# Patient Record
Sex: Female | Born: 1935 | Race: White | Hispanic: No | Marital: Single | Smoking: Former smoker
Health system: Southern US, Community
[De-identification: ages and names within clinical notes are randomized; demographics above are authoritative.]

## PROBLEM LIST (undated history)

## (undated) DIAGNOSIS — R339 Retention of urine, unspecified: Secondary | ICD-10-CM

## (undated) DIAGNOSIS — R609 Edema, unspecified: Secondary | ICD-10-CM

## (undated) DIAGNOSIS — Z87448 Personal history of other diseases of urinary system: Secondary | ICD-10-CM

## (undated) DIAGNOSIS — H9190 Unspecified hearing loss, unspecified ear: Secondary | ICD-10-CM

## (undated) DIAGNOSIS — Z974 Presence of external hearing-aid: Secondary | ICD-10-CM

## (undated) DIAGNOSIS — N35919 Unspecified urethral stricture, male, unspecified site: Secondary | ICD-10-CM

## (undated) DIAGNOSIS — M1990 Unspecified osteoarthritis, unspecified site: Secondary | ICD-10-CM

## (undated) DIAGNOSIS — I10 Essential (primary) hypertension: Secondary | ICD-10-CM

---

## 1999-12-01 ENCOUNTER — Other Ambulatory Visit: Admission: RE | Admit: 1999-12-01 | Discharge: 1999-12-01 | Payer: Self-pay | Admitting: Internal Medicine

## 2007-01-31 ENCOUNTER — Ambulatory Visit: Payer: Self-pay | Admitting: Vascular Surgery

## 2010-10-16 ENCOUNTER — Ambulatory Visit (HOSPITAL_BASED_OUTPATIENT_CLINIC_OR_DEPARTMENT_OTHER)
Admission: RE | Admit: 2010-10-16 | Discharge: 2010-10-16 | Disposition: A | Discharge status: Home / Self Care | Payer: Medicare Other | Source: Ambulatory Visit | Attending: Urology | Admitting: Urology

## 2010-10-16 ENCOUNTER — Ambulatory Visit (HOSPITAL_COMMUNITY)
Admission: RE | Admit: 2010-10-16 | Discharge: 2010-10-16 | Disposition: A | Discharge status: Home / Self Care | Payer: Medicare Other | Source: Ambulatory Visit | Attending: Urology | Admitting: Urology

## 2010-10-16 DIAGNOSIS — N35919 Unspecified urethral stricture, male, unspecified site: Secondary | ICD-10-CM | POA: Insufficient documentation

## 2010-10-16 DIAGNOSIS — N323 Diverticulum of bladder: Secondary | ICD-10-CM | POA: Insufficient documentation

## 2010-10-16 DIAGNOSIS — Z01810 Encounter for preprocedural cardiovascular examination: Secondary | ICD-10-CM | POA: Insufficient documentation

## 2010-10-16 DIAGNOSIS — Z01811 Encounter for preprocedural respiratory examination: Secondary | ICD-10-CM | POA: Insufficient documentation

## 2010-10-16 DIAGNOSIS — R9431 Abnormal electrocardiogram [ECG] [EKG]: Secondary | ICD-10-CM | POA: Insufficient documentation

## 2010-10-16 HISTORY — PX: OTHER SURGICAL HISTORY: SHX169

## 2010-10-16 LAB — POCT I-STAT 4, (NA,K, GLUC, HGB,HCT)
HCT: 40 % (ref 36.0–46.0)
Hemoglobin: 13.6 g/dL (ref 12.0–15.0)

## 2010-10-24 NOTE — Op Note (Signed)
NAMEMarland Kitchen  BICH, MCHANEY           ACCOUNT NO.:  [id number]  MEDICAL RECORD NO.:  [id number]           PATIENT TYPE:  O  LOCATION:  XRAY                         FACILITY:  [facility]  PHYSICIAN:  Excell Seltzer. Annabell Howells, M.D.    DATE OF BIRTH:  07/20/1928  DATE OF PROCEDURE:  10/16/2010 DATE OF DISCHARGE:                              OPERATIVE REPORT   PROCEDURES:  Cystoscopy, dilation of urethral stricture, and fulguration of bleeder  PREOPERATIVE DIAGNOSIS:  Urethral stricture, possible bladder stone.  POSTOPERATIVE DIAGNOSIS:  Urethral stricture.  No stone found.  SURGEON:  Excell Seltzer. Annabell Howells, M.D.  ANESTHESIA:  General.  SPECIMEN:  None.  DRAINS:  A 20-French Foley catheter.  COMPLICATIONS:  None.  INDICATIONS:  Tara Bennett is an 77 year old white female with a history of urethral stricture who returned earlier this week for followup studies.  She was found to have a very slow flow of approximately 2 to 3 cc per second and residual urine of 276 cc consistent with recurrence of a stricture.  Also on the ultrasound, there was a suggestion of a 1.3-cm bladder stone.  When an attempt in the office to perform cystoscopy and dilation was unsuccessful, it was felt that repeating procedure under anesthesia would be the most effective way to manage her stricture.  FINDINGS AND PROCEDURE:  She was given Cipro.  She was taken to the operating room where general anesthetic was induced.  She was placed in lithotomy position.  Her perineum and genitalia were prepped with Betadine solution.  She was draped in usual sterile fashion.  Cystoscopy was performed using a 22-French scope and 12-degree lens.  Initial examination revealed a normal meatus.  The distal ureter was unremarkable but there was very tight stricture proximally that was difficult to visualize.  A 22-French female sound was then passed successfully into the bladder with return of urine as confirmation.  She was then dilated up  to 63- [place].  There was some bleeding with the dilation.  Cystoscopy was performed which revealed disrupted scar anteriorly with a more normal posterior urethra.  Examination of bladder revealed small diverticulum of right posterior wall with mild trabeculation.  No tumors or stones were identified.  Ureteral orifices were in the normal anatomic position effluxing clear urine.  At this point, the urethra was then dilated to 32-French.  Inspection after dilation revealed an active bleeder at 12 o'clock.  A Bugbee electrode was then used to fulgurate the bleeding site.  There was still some oozing after Bugbee and I did not want to overly cauterize the urethra for fear of worsening her stricture.  So with the minimal oozing, a 20-French Foley catheter was inserted.  The balloon was filled with 10 cc of sterile fluid and placed a straight drainage.  She was taken down from lithotomy position.  Her anesthetic was reversed.  She was moved to the recovery room in stable condition. The catheter will be removed in an hour.  She will be sent home with Vicodin and Pyridium and will follow up with me on October 23, 2010 at 10:45.     Excell Seltzer. Annabell Howells, M.D.  JJW/MEDQ  D:  10/16/2010  T:  10/16/2010  Job:  [id number]  Electronically Signed by Bjorn Pippin M.D. on 10/23/2010 12:58:40 PM

## 2011-01-01 NOTE — Op Note (Signed)
  NAMEMarland Bennett  [name], [name]           ACCOUNT NO.:  [id number]  MEDICAL RECORD NO.:  [id number]           PATIENT TYPE:  O  LOCATION:  XRAY                         FACILITY:  [facility]  PHYSICIAN:  Excell Seltzer. Annabell Howells, M.D.    DATE OF BIRTH:  06-Sep-1927  DATE OF PROCEDURE:  10/16/2010 DATE OF DISCHARGE:  10/16/2010                              OPERATIVE REPORT   Patient of Dr. Bjorn Pippin.  PROCEDURE:  Cystoscopy, urethral dilation, and fulguration.  PREOPERATIVE DIAGNOSIS:  Urethral stricture with possible bladder stone.  POSTOPERATIVE DIAGNOSIS:  Urethral stricture.  SURGEON:  Excell Seltzer. Annabell Howells, MD  ANESTHESIA:  General.  SPECIMEN:  None.  DRAIN:  Foley catheter.  COMPLICATIONS:  None.  INDICATIONS:  [name] is an 77 year old white female with history of urethral stricture disease who on office ultrasound was felt to have a possible stone in the bladder and it was felt that operative intervention with dilation of stone removal was indicated.  FINDINGS OF PROCEDURE:  She was given Cipro.  She was taken to the operating room where general anesthetic was induced.  She was placed in lithotomy position.  Her perineum and genitalia were prepped with Betadine solution and she was draped in usual sterile fashion.  Her urethra was calibrated with female sounds to 30-French and cystoscopy was then performed.  Examination revealed a normal bladder, normal ureteral orifices, and no obvious stones.  There was mild trabeculation.  There was some bleeding from the dilated urethra.  A Bugbee electrode was used to fulgurate arterial bleeder.  At this point, a Foley was inserted.  The patient was taken down from lithotomy position.  Her anesthetic was reversed and she was moved to recovery room in stable condition.     Excell Seltzer. Annabell Howells, M.D.     JJW/MEDQ  D:  12/04/2010  T:  12/05/2010  Job:  [id number]  Electronically Signed by Bjorn Pippin M.D. on 01/01/2011 10:49:48 AM

## 2011-01-08 NOTE — Op Note (Signed)
  NAMEMarland Kitchen  Tara, Bennett           ACCOUNT NO.:  [id number]  MEDICAL RECORD NO.:  [id number]           PATIENT TYPE:  O  LOCATION:  XRAY                         FACILITY:  [facility]  PHYSICIAN:  Excell Seltzer. Annabell Howells, M.D.    DATE OF BIRTH:  October 25, 1927  DATE OF PROCEDURE:  10/16/2010 DATE OF DISCHARGE:  10/16/2010                              OPERATIVE REPORT   PROCEDURE: 1. Cystoscopy. 2. Urethral dilation. 3. Fulguration of bleeder  PREOPERATIVE DIAGNOSIS:  Urethral stricture, possible bladder stone.  POSTOPERATIVE DIAGNOSES:  Urethral stricture.  SURGEON:  Excell Seltzer. Annabell Howells, M.D.  ANESTHESIA:  General.  COMPLICATIONS:  None.  INDICATIONS:  Ms. Bolds is an 77 year old white female with urethral stricture and on ultrasound a possible bladder stone.  It was felt that cystoscopy with urethral dilation and removal of bladder stone was indicated.  FINDINGS OF PROCEDURE:  She was taken to the operating room where general anesthetic was induced.  She was given antibiotic.  She was placed in lithotomy position.  Her perineum and genitalia were prepped with Betadine solution.  She was draped in usual sterile fashion.  Time- out was performed.  Her urethra was dilated with female sounds to 29- [place].  Endoscopy was then performed.  The bladder wall was smooth and pale without tumor, stones or inflammation.  Ureteral orifices were unremarkable.  There was no evidence of a bladder stone.  At this point there was noted to be some bleeding from the anterior urethra where the stricture had been dilated.  This was fulgurated with a Bugbee electrode.  The bladder was then drained.  The patient was taken down from lithotomy position.  Her anesthetic was reversed.  She was moved to recovery room in stable condition.  There were no complications.     Excell Seltzer. Annabell Howells, M.D.    JJW/MEDQ  D:  01/01/2011  T:  01/01/2011  Job:  [id number]  Electronically Signed by Bjorn Pippin M.D. on 01/08/2011  07:58:26 AM

## 2011-06-30 ENCOUNTER — Other Ambulatory Visit: Payer: Self-pay | Admitting: Urology

## 2011-07-01 ENCOUNTER — Encounter (HOSPITAL_BASED_OUTPATIENT_CLINIC_OR_DEPARTMENT_OTHER): Payer: Self-pay | Admitting: *Deleted

## 2011-07-01 NOTE — H&P (Signed)
Reason For Visit Urethral dilation.  History of Present Illness          Tara Bennett is an 77 y/o white female with h/o urethral stricture.  She presents today for acute evaluation of LUTS and is requesting repeat UDF.  Her last dilation was 10/2010 and had to be performed under anesthesia due to inability to tolerate in the office.  She reports dramatic improvement for several months following this procedure.  She has noticed a gradual weakening of her stream for the past 2 months but over the past 2-3 weeks she has had progressive weakening of her stream with strong urge to void and frequent, small volume voids.  She does not feel that she is emptying her bladder on voiding.  She denies gross hematuria, flank pain, fever or chills. She has been on Estrace cream in the past but had stopped this.  She resumed the Estrace cream 06/29/11.   Past Medical History Problems  1. History of  Acute Urinary Retention 788.20 2. History of  Arthritis V13.4 3. History of  Hypercholesterolemia 272.0 4. History of  Hypertension 401.9 5. History of  Initiating Urination Requires Straining 788.65  Surgical History Problems  1. History of  Cystoscopy For Urethral Stricture 2. History of  Dilation Of Female Urethra  Current Meds 1. Advil TABS; Therapy: (Recorded:23Nov2011) to 2. Aspirin 81 MG Oral Tablet; Take 1 tablet daily; Therapy: (Recorded:23Nov2011) to 3. Calcium 600 + D TABS; Therapy: (Recorded:23Nov2011) to 4. Diovan HCT 160-12.5 MG Oral Tablet; Therapy: 03May2011 to 5. Estrace 0.1 MG/GM Vaginal Cream; APPLY/RUB 1/2 INCH TO VAGINAL OPENING & URETHRAL  AREA; Therapy: 23Nov2011 to (Last Rx:23Nov2011) 6. Fish Oil 1000 MG Oral Capsule; Therapy: (Recorded:23Nov2011) to 7. Furosemide 40 MG Oral Tablet; Therapy: 23Jun2011 to 8. Klor-Con M20 20 MEQ Oral Tablet Extended Release; Therapy: 23Jun2011 to 9. Multiple Vitamin TABS; Therapy: (Recorded:23Nov2011) to 10. Simvastatin 20 MG Oral Tablet; Therapy: 03May2011  to 11. Tylenol TABS; Therapy: (Recorded:23Nov2011) to 12. Vitamin D 1000 UNIT Oral Tablet; Take 1 tablet daily; Therapy: (Recorded:23Nov2011) to  Allergies Medication  1. Detrol TABS 2. Ditropan XL TBCR  Family History Problems  1. Maternal history of  Death In The Family Father Age 18 2. Maternal history of  Death In The Family Mother Age 53 CVA 3. Maternal history of  Stroke Syndrome V17.1  Social History Problems  1. Caffeine Use 2-3/day 2. Former Smoker V15.82 2-3 cigarettes socially. None since 1965 3. Marital History - Single Denied  4. History of  Alcohol Use  Review of Systems Genitourinary, constitutional, skin, eye, otolaryngeal, hematologic/lymphatic, cardiovascular, pulmonary, endocrine, musculoskeletal, gastrointestinal, neurological and psychiatric system(s) were reviewed and pertinent findings if present are noted.  Genitourinary: urinary frequency, urinary urgency, urinary hesitancy, incomplete emptying of bladder and suprapubic pain.    Vitals Vital Signs [Data Includes: Last 1 Day]  13Nov2012 02:48PM  Blood Pressure: 145 / 89 Temperature: 97.4 F Heart Rate: 90  Physical Exam Constitutional: Well nourished and well developed . No acute distress.  ENT:. The ears and nose are normal in appearance.  Neck: The appearance of the neck is normal and no neck mass is present.  Pulmonary: No respiratory distress and normal respiratory rhythm and effort.  Cardiovascular:. No peripheral edema.  Skin: Normal skin turgor, no visible rash and no visible skin lesions.  Neuro/Psych:. Mood and affect are appropriate.    Results/Data Urine [Data Includes: Last 1 Day]  13Nov2012  COLOR: YELLOW  Reference Range YELLOW APPEARANCE: CLEAR  Reference Range CLEAR  SPECIFIC GRAVITY: 1.010  Reference Range 1.005-1.030 pH: 7.0  Reference Range 5.0-8.0 GLUCOSE: NEG mg/dL Reference Range NEG BILIRUBIN: NEG  Reference Range NEG KETONE: NEG mg/dL Reference Range NEG BLOOD: MOD   Abnormal Reference Range NEG PROTEIN: TRACE mg/dL Reference Range NEG UROBILINOGEN: 0.2 mg/dL Reference Range 1.6-1.0 NITRITE: NEG  Reference Range NEG LEUKOCYTE ESTERASE: NEG  Reference Range NEG SQUAMOUS EPITHELIAL/HPF: FEW  Reference Range RARE WBC: 0-3 WBC/hpf Reference Range <4 RBC: 0-3 RBC/hpf Reference Range <4 BACTERIA: RARE  Reference Range RARE CRYSTALS: NONE SEEN  Reference Range NEG CASTS: NONE SEEN  Reference Range NEG Other: QNS UNSPUN   The following clinical lab reports were reviewed: Marland Kitchen  Urinalysis  PVR: Ultrasound PVR 306.83 ml. Selected Results  UA With REFLEX 13Nov2012 03:13PM Bruning, Ashlyn   Test Name Result Flag Reference  COLOR YELLOW  YELLOW  APPEARANCE CLEAR  CLEAR  SPECIFIC GRAVITY 1.010  1.005-1.030  pH 7.0  5.0-8.0  GLUCOSE NEG mg/dL  NEG  BILIRUBIN NEG  NEG  KETONE NEG mg/dL  NEG  BLOOD MOD A NEG  PROTEIN TRACE mg/dL  NEG  UROBILINOGEN 0.2 mg/dL  9.6-0.4  NITRITE NEG  NEG  LEUKOCYTE ESTERASE NEG  NEG  SQUAMOUS EPITHELIAL/HPF FEW  RARE  WBC 0-3 WBC/hpf  <4  RBC 0-3 RBC/hpf  <4  BACTERIA RARE  RARE  CRYSTALS NONE SEEN  NEG  CASTS NONE SEEN  NEG  Other QNS UNSPUN     Procedure  Using sterile technique. urethral dilation using walther sounds was attempted but unsuccessful.  Pt was unable to tolerate the pediatric 75fr sound.  The procedure was aborted and we will schedule cysto UDF under anesthesia in the very near future.     Assessment Assessed  1. Incomplete Emptying Of Bladder 788.21 2. Urethral Stricture 598.9 3. Postmenopausal Atrophic Vaginitis 627.3  Plan  I have discussed the current assessment and plan with Dr. Annabell Howells who is in agreement with proceeding with cystoscopy, urethral dilation under anesthesia. Orders completed.

## 2011-07-02 ENCOUNTER — Ambulatory Visit (HOSPITAL_BASED_OUTPATIENT_CLINIC_OR_DEPARTMENT_OTHER)
Admission: RE | Admit: 2011-07-02 | Discharge: 2011-07-02 | Disposition: A | Discharge status: Home / Self Care | Payer: Medicare Other | Source: Ambulatory Visit | Attending: Urology | Admitting: Urology

## 2011-07-02 ENCOUNTER — Encounter (HOSPITAL_BASED_OUTPATIENT_CLINIC_OR_DEPARTMENT_OTHER)
Admission: RE | Disposition: A | Discharge status: Home / Self Care | Payer: Self-pay | Source: Ambulatory Visit | Attending: Urology

## 2011-07-02 ENCOUNTER — Encounter (HOSPITAL_BASED_OUTPATIENT_CLINIC_OR_DEPARTMENT_OTHER): Payer: Self-pay | Admitting: *Deleted

## 2011-07-02 ENCOUNTER — Encounter (HOSPITAL_BASED_OUTPATIENT_CLINIC_OR_DEPARTMENT_OTHER): Payer: Self-pay | Admitting: Anesthesiology

## 2011-07-02 ENCOUNTER — Ambulatory Visit (HOSPITAL_BASED_OUTPATIENT_CLINIC_OR_DEPARTMENT_OTHER): Payer: Medicare Other | Admitting: Anesthesiology

## 2011-07-02 DIAGNOSIS — R3916 Straining to void: Secondary | ICD-10-CM | POA: Insufficient documentation

## 2011-07-02 DIAGNOSIS — N35919 Unspecified urethral stricture, male, unspecified site: Secondary | ICD-10-CM | POA: Insufficient documentation

## 2011-07-02 DIAGNOSIS — R339 Retention of urine, unspecified: Secondary | ICD-10-CM | POA: Insufficient documentation

## 2011-07-02 DIAGNOSIS — Z79899 Other long term (current) drug therapy: Secondary | ICD-10-CM | POA: Insufficient documentation

## 2011-07-02 DIAGNOSIS — Z7982 Long term (current) use of aspirin: Secondary | ICD-10-CM | POA: Insufficient documentation

## 2011-07-02 DIAGNOSIS — IMO0002 Reserved for concepts with insufficient information to code with codable children: Secondary | ICD-10-CM | POA: Diagnosis present

## 2011-07-02 DIAGNOSIS — I10 Essential (primary) hypertension: Secondary | ICD-10-CM | POA: Insufficient documentation

## 2011-07-02 DIAGNOSIS — E7800 Pure hypercholesterolemia, unspecified: Secondary | ICD-10-CM | POA: Insufficient documentation

## 2011-07-02 HISTORY — PX: CYSTOSCOPY WITH URETHRAL DILATATION: SHX5125

## 2011-07-02 HISTORY — DX: Unspecified hearing loss, unspecified ear: H91.90

## 2011-07-02 HISTORY — DX: Personal history of other diseases of urinary system: Z87.448

## 2011-07-02 HISTORY — DX: Unspecified osteoarthritis, unspecified site: M19.90

## 2011-07-02 HISTORY — DX: Essential (primary) hypertension: I10

## 2011-07-02 LAB — POCT I-STAT 4, (NA,K, GLUC, HGB,HCT)
Glucose, Bld: 101 mg/dL — ABNORMAL HIGH (ref 70–99)
HCT: 43 % (ref 36.0–46.0)
Hemoglobin: 14.6 g/dL (ref 12.0–15.0)
Potassium: 3.8 mEq/L (ref 3.5–5.1)

## 2011-07-02 SURGERY — CYSTOSCOPY, WITH URETHRAL DILATION
Anesthesia: Choice

## 2011-07-02 MED ORDER — CIPROFLOXACIN IN D5W 400 MG/200ML IV SOLN
400.0000 mg | INTRAVENOUS | Status: AC
  Administered 2011-07-02: 400 mg via INTRAVENOUS

## 2011-07-02 MED ORDER — PROPOFOL 10 MG/ML IV EMUL
INTRAVENOUS | Status: DC | PRN
  Administered 2011-07-02: 160 mg via INTRAVENOUS

## 2011-07-02 MED ORDER — PROMETHAZINE HCL 25 MG/ML IJ SOLN: 6.2500 mg | INTRAMUSCULAR | Status: DC | PRN

## 2011-07-02 MED ORDER — FENTANYL CITRATE 0.05 MG/ML IJ SOLN
INTRAMUSCULAR | Status: DC | PRN
  Administered 2011-07-02 (×2): 25 ug via INTRAVENOUS
  Administered 2011-07-02 (×2): 50 ug via INTRAVENOUS

## 2011-07-02 MED ORDER — IOHEXOL 350 MG/ML SOLN: INTRAVENOUS | Status: DC | PRN

## 2011-07-02 MED ORDER — FENTANYL CITRATE 0.05 MG/ML IJ SOLN: 25.0000 ug | INTRAMUSCULAR | Status: DC | PRN

## 2011-07-02 MED ORDER — LACTATED RINGERS IV SOLN
INTRAVENOUS | Status: DC
  Administered 2011-07-02: 16:00:00 via INTRAVENOUS

## 2011-07-02 MED ORDER — URIBEL 118 MG PO CAPS: 118.0000 mg | ORAL_CAPSULE | Freq: Four times a day (QID) | ORAL | Status: DC | PRN

## 2011-07-02 MED ORDER — LIDOCAINE HCL (CARDIAC) 20 MG/ML IV SOLN
INTRAVENOUS | Status: DC | PRN
  Administered 2011-07-02: 80 mg via INTRAVENOUS

## 2011-07-02 MED ORDER — ONDANSETRON HCL 4 MG/2ML IJ SOLN
INTRAMUSCULAR | Status: DC | PRN
  Administered 2011-07-02: 4 mg via INTRAVENOUS

## 2011-07-02 MED ORDER — STERILE WATER FOR IRRIGATION IR SOLN
Status: DC | PRN
  Administered 2011-07-02: 3000 mL

## 2011-07-02 MED ORDER — LACTATED RINGERS IV SOLN
INTRAVENOUS | Status: DC | PRN
  Administered 2011-07-02: 16:00:00 via INTRAVENOUS

## 2011-07-02 SURGICAL SUPPLY — 26 items
BAG DRAIN URO-CYSTO SKYTR STRL (DRAIN) ×2 IMPLANT
BAG DRN UROCATH (DRAIN) ×1
BALLN NEPHROSTOMY (BALLOONS) ×2
BALLOON NEPHROSTOMY (BALLOONS) ×1 IMPLANT
CANISTER SUCT LVC 12 LTR MEDI- (MISCELLANEOUS) ×2 IMPLANT
CATH FOLEY 2WAY SLVR  5CC 16FR (CATHETERS)
CATH FOLEY 2WAY SLVR 5CC 16FR (CATHETERS) IMPLANT
CLOTH BEACON ORANGE TIMEOUT ST (SAFETY) ×2 IMPLANT
DRAPE CAMERA CLOSED 9X96 (DRAPES) ×2 IMPLANT
ELECT REM PT RETURN 9FT ADLT (ELECTROSURGICAL) ×2
ELECTRODE REM PT RTRN 9FT ADLT (ELECTROSURGICAL) ×1 IMPLANT
GLOVE BIOGEL PI IND STRL 6.5 (GLOVE) ×1 IMPLANT
GLOVE BIOGEL PI INDICATOR 6.5 (GLOVE) ×1
GLOVE SURG SS PI 8.0 STRL IVOR (GLOVE) ×2 IMPLANT
GOWN BRE IMP SLV AUR LG STRL (GOWN DISPOSABLE) ×2 IMPLANT
GOWN STRL REIN XL XLG (GOWN DISPOSABLE) ×2 IMPLANT
GOWN XL W/COTTON TOWEL STD (GOWNS) ×2 IMPLANT
GUIDEWIRE STR DUAL SENSOR (WIRE) IMPLANT
NDL SAFETY ECLIPSE 18X1.5 (NEEDLE) IMPLANT
NEEDLE HYPO 18GX1.5 SHARP (NEEDLE)
NEEDLE HYPO 22GX1.5 SAFETY (NEEDLE) IMPLANT
NS IRRIG 500ML POUR BTL (IV SOLUTION) IMPLANT
PACK CYSTOSCOPY (CUSTOM PROCEDURE TRAY) ×2 IMPLANT
SYR 20CC LL (SYRINGE) IMPLANT
SYR 30ML LL (SYRINGE) IMPLANT
WATER STERILE IRR 3000ML UROMA (IV SOLUTION) ×2 IMPLANT

## 2011-07-02 NOTE — Anesthesia Preprocedure Evaluation (Addendum)
Anesthesia Evaluation  Patient identified by MRN, date of birth, ID band Patient awake    Reviewed: Allergy & Precautions, H&P , NPO status , Patient's Chart, lab work & pertinent test results, reviewed documented beta blocker date and time   History of Anesthesia Complications (+) AWARENESS UNDER ANESTHESIA  Airway Mallampati: II TM Distance: >3 FB Neck ROM: full    Dental No notable dental hx. (+) Teeth Intact   Pulmonary neg pulmonary ROS,  clear to auscultation  Pulmonary exam normal       Cardiovascular Exercise Tolerance: Good hypertension, Pt. on medications neg cardio ROS regular Normal    Neuro/Psych Negative Neurological ROS  Negative Psych ROS   GI/Hepatic negative GI ROS, Neg liver ROS,   Endo/Other  Negative Endocrine ROS  Renal/GU negative Renal ROS  Genitourinary negative   Musculoskeletal   Abdominal   Peds  Hematology negative hematology ROS (+)   Anesthesia Other Findings   Reproductive/Obstetrics negative OB ROS                       Anesthesia Physical Anesthesia Plan  ASA: II  Anesthesia Plan: General   Post-op Pain Management:    Induction: Intravenous  Airway Management Planned: LMA  Additional Equipment:   Intra-op Plan:   Post-operative Plan:   Informed Consent: I have reviewed the patients History and Physical, chart, labs and discussed the procedure including the risks, benefits and alternatives for the proposed anesthesia with the patient or authorized representative who has indicated his/her understanding and acceptance.   Dental Advisory Given and Dental advisory given  Plan Discussed with: CRNA and Surgeon  Anesthesia Plan Comments:       Anesthesia Quick Evaluation

## 2011-07-02 NOTE — Op Note (Signed)
NAMEMarland Kitchen  [name], [facility] NO.:  [id number]  MEDICAL RECORD NO.:  [id number]  LOCATION:                               FACILITY:  [facility]  PHYSICIAN:  Excell Seltzer. Annabell Howells, M.D.         DATE OF BIRTH:  DATE OF PROCEDURE:  07/02/2011 DATE OF DISCHARGE:                              OPERATIVE REPORT   The patient of Dr. Excell Seltzer. [name].  PROCEDURE:  Cystoscopy with urethral dilation.  PREOPERATIVE DIAGNOSIS:  Urethral stenosis.  POSTOPERATIVE DIAGNOSIS:  Urethral stenosis.  SURGEON:  Excell Seltzer. Annabell Howells, M.D.  ANESTHESIA:  General.  SPECIMEN:  None.  DRAINS:  None.  COMPLICATIONS:  None.  INDICATIONS:  [name] is an 77 year old white female with a history of urethral stenosis who has had recurrent voiding symptoms. Attempt at dilation in the office is unsuccessful.  FINDINGS OF PROCEDURE:  She was taken to the operating room, where she was given Cipro.  A general anesthetic was induced.  She was placed in lithotomy position.  Her perineum and genitalia were prepped with Betadine solution.  She was draped in usual sterile fashion.  Her urethra was calibrated with female sounds.  It was tight at 18.  She was dilated from 18-30-French with female sounds.  Cystoscopy was then performed with the 22-French scope and 12 degree lens.  Examination revealed a disrupted proximal urethral stricture without bleeding.  Examination of the bladder revealed moderate trabeculation with occasional cellule.  No tumor, stones, or inflammation were noted.  Urethral orifices were unremarkable.  The bladder was drained.  The patient was taken down from lithotomy position.  Her anesthetic was reversed.  She was moved to recovery in stable condition.  There were no complications.     Excell Seltzer. Annabell Howells, M.D.     JJW/MEDQ  D:  07/02/2011  T:  07/02/2011  Job:  [id number]

## 2011-07-02 NOTE — Progress Notes (Signed)
Unable to void, bladder scanned showed 0 cc, after attempting to void several times. Jetta Lout on call and paged and called back, obtained order for in and out cath, then d/c home.

## 2011-07-02 NOTE — Anesthesia Postprocedure Evaluation (Signed)
  Anesthesia Post-op Note  Patient: Tara Bennett  Procedure(s) Performed:  CYSTOSCOPY WITH URETHRAL DILATATION  Patient Location: PACU  Anesthesia Type: General  Level of Consciousness: awake and alert   Airway and Oxygen Therapy: Patient Spontanous Breathing  Post-op Pain: mild  Post-op Assessment: Post-op Vital signs reviewed, Patient's Cardiovascular Status Stable, Respiratory Function Stable, Patent Airway and No signs of Nausea or vomiting  Post-op Vital Signs: stable  Complications: No apparent anesthesia complications

## 2011-07-02 NOTE — Anesthesia Procedure Notes (Addendum)
Procedure Name: LMA Insertion Performed by: Iline Oven Pre-anesthesia Checklist: Patient identified, Emergency Drugs available, Suction available and Patient being monitored Patient Re-evaluated:Patient Re-evaluated prior to inductionOxygen Delivery Method: Circle System Utilized Preoxygenation: Pre-oxygenation with 100% oxygen Intubation Type: IV induction Ventilation: Mask ventilation without difficulty LMA: LMA inserted LMA Size: 4.0 Number of attempts: 1 Airway Equipment and Method: bite block Placement Confirmation: positive ETCO2 Tube secured with: Tape Dental Injury: Teeth and Oropharynx as per pre-operative assessment

## 2011-07-02 NOTE — Brief Op Note (Signed)
07/02/2011  3:59 PM  PATIENT:  Tara Bennett  77 y.o. female  PRE-OPERATIVE DIAGNOSIS:  URETHRAL STRICTURE  POST-OPERATIVE DIAGNOSIS:  URETHRAL STRICTURE  PROCEDURE:  Procedure(s): CYSTOSCOPY WITH URETHRAL DILATATION  SURGEON:  Surgeon(s): Anner Crete  PHYSICIAN ASSISTANT:   ASSISTANTS: none   ANESTHESIA:   general  EBL:     BLOOD ADMINISTERED:none  DRAINS: none   LOCAL MEDICATIONS USED:  NONE  SPECIMEN:  No Specimen  DISPOSITION OF SPECIMEN:  N/A  COUNTS:  YES  TOURNIQUET:  * No tourniquets in log *  DICTATION: .Other Dictation: Dictation Number [id number]  PLAN OF CARE: Discharge to home after PACU  PATIENT DISPOSITION:  PACU - hemodynamically stable.   Delay start of Pharmacological VTE agent (>24hrs) due to surgical blood loss or risk of bleeding:  {YES/NO/NOT APPLICABLE:20182

## 2011-07-02 NOTE — Transfer of Care (Signed)
Immediate Anesthesia Transfer of Care Note  Patient: Tara Bennett  Procedure(s) Performed:  CYSTOSCOPY WITH URETHRAL DILATATION  Patient Location: PACU  Anesthesia Type: General  Level of Consciousness: awake, sedated, patient cooperative and responds to stimulation  Airway & Oxygen Therapy: Patient Spontanous Breathing and Patient connected to face mask oxygen  Post-op Assessment: Report given to PACU RN, Post -op Vital signs reviewed and stable and Patient moving all extremities  Post vital signs: Reviewed and stable  Complications: No apparent anesthesia complications

## 2011-07-02 NOTE — Interval H&P Note (Signed)
History and Physical Interval Note:   07/02/2011   7:33 AM   Tara Bennett  has presented today for surgery, with the diagnosis of URETHRAL STRICTURE  The various methods of treatment have been discussed with the patient and family. After consideration of risks, benefits and other options for treatment, the patient has consented to  Procedure(s): CYSTOSCOPY WITH URETHRAL DILATATION as a surgical intervention .  The patients' history has been reviewed, patient examined, no change in status, stable for surgery.  I have reviewed the patients' chart and labs.  Questions were answered to the patient's satisfaction.     Anner Crete  MD

## 2011-07-17 ENCOUNTER — Encounter (HOSPITAL_BASED_OUTPATIENT_CLINIC_OR_DEPARTMENT_OTHER): Payer: Self-pay | Admitting: Urology

## 2011-07-17 NOTE — Addendum Note (Signed)
Addendum  created 07/17/11 0829 by Jamarious Febo S Kandyce Dieguez, MD   Modules edited:Anesthesia Responsible Staff    

## 2011-07-17 NOTE — Addendum Note (Signed)
Addendum  created 07/17/11 0829 by Riesa Pope, MD   Modules edited:Anesthesia Responsible Staff

## 2011-08-31 DIAGNOSIS — N952 Postmenopausal atrophic vaginitis: Secondary | ICD-10-CM | POA: Diagnosis not present

## 2011-08-31 DIAGNOSIS — N35919 Unspecified urethral stricture, male, unspecified site: Secondary | ICD-10-CM | POA: Diagnosis not present

## 2011-08-31 DIAGNOSIS — N3941 Urge incontinence: Secondary | ICD-10-CM | POA: Diagnosis not present

## 2011-08-31 DIAGNOSIS — R339 Retention of urine, unspecified: Secondary | ICD-10-CM | POA: Diagnosis not present

## 2011-11-23 DIAGNOSIS — E785 Hyperlipidemia, unspecified: Secondary | ICD-10-CM | POA: Diagnosis not present

## 2011-11-23 DIAGNOSIS — M810 Age-related osteoporosis without current pathological fracture: Secondary | ICD-10-CM | POA: Diagnosis not present

## 2011-11-23 DIAGNOSIS — I10 Essential (primary) hypertension: Secondary | ICD-10-CM | POA: Diagnosis not present

## 2011-11-23 DIAGNOSIS — I872 Venous insufficiency (chronic) (peripheral): Secondary | ICD-10-CM | POA: Diagnosis not present

## 2011-11-30 DIAGNOSIS — N3941 Urge incontinence: Secondary | ICD-10-CM | POA: Diagnosis not present

## 2011-11-30 DIAGNOSIS — R339 Retention of urine, unspecified: Secondary | ICD-10-CM | POA: Diagnosis not present

## 2011-11-30 DIAGNOSIS — N952 Postmenopausal atrophic vaginitis: Secondary | ICD-10-CM | POA: Diagnosis not present

## 2011-11-30 DIAGNOSIS — N35919 Unspecified urethral stricture, male, unspecified site: Secondary | ICD-10-CM | POA: Diagnosis not present

## 2011-12-17 DIAGNOSIS — D239 Other benign neoplasm of skin, unspecified: Secondary | ICD-10-CM | POA: Diagnosis not present

## 2011-12-17 DIAGNOSIS — L821 Other seborrheic keratosis: Secondary | ICD-10-CM | POA: Diagnosis not present

## 2012-03-28 DIAGNOSIS — H2510 Age-related nuclear cataract, unspecified eye: Secondary | ICD-10-CM | POA: Diagnosis not present

## 2012-05-04 DIAGNOSIS — Z1231 Encounter for screening mammogram for malignant neoplasm of breast: Secondary | ICD-10-CM | POA: Diagnosis not present

## 2012-05-30 DIAGNOSIS — N3941 Urge incontinence: Secondary | ICD-10-CM | POA: Diagnosis not present

## 2012-05-30 DIAGNOSIS — N35919 Unspecified urethral stricture, male, unspecified site: Secondary | ICD-10-CM | POA: Diagnosis not present

## 2012-05-30 DIAGNOSIS — R339 Retention of urine, unspecified: Secondary | ICD-10-CM | POA: Diagnosis not present

## 2012-05-30 DIAGNOSIS — N952 Postmenopausal atrophic vaginitis: Secondary | ICD-10-CM | POA: Diagnosis not present

## 2012-06-14 DIAGNOSIS — I872 Venous insufficiency (chronic) (peripheral): Secondary | ICD-10-CM | POA: Diagnosis not present

## 2012-06-14 DIAGNOSIS — N35919 Unspecified urethral stricture, male, unspecified site: Secondary | ICD-10-CM | POA: Diagnosis not present

## 2012-06-14 DIAGNOSIS — J301 Allergic rhinitis due to pollen: Secondary | ICD-10-CM | POA: Diagnosis not present

## 2012-06-14 DIAGNOSIS — M1990 Unspecified osteoarthritis, unspecified site: Secondary | ICD-10-CM | POA: Diagnosis not present

## 2012-07-01 DIAGNOSIS — Z23 Encounter for immunization: Secondary | ICD-10-CM | POA: Diagnosis not present

## 2012-07-27 DIAGNOSIS — M1710 Unilateral primary osteoarthritis, unspecified knee: Secondary | ICD-10-CM | POA: Diagnosis not present

## 2012-07-27 DIAGNOSIS — M25569 Pain in unspecified knee: Secondary | ICD-10-CM | POA: Diagnosis not present

## 2012-09-21 DIAGNOSIS — M1710 Unilateral primary osteoarthritis, unspecified knee: Secondary | ICD-10-CM | POA: Diagnosis not present

## 2012-11-22 DIAGNOSIS — R609 Edema, unspecified: Secondary | ICD-10-CM | POA: Diagnosis not present

## 2012-11-22 DIAGNOSIS — L299 Pruritus, unspecified: Secondary | ICD-10-CM | POA: Diagnosis not present

## 2012-11-22 DIAGNOSIS — M810 Age-related osteoporosis without current pathological fracture: Secondary | ICD-10-CM | POA: Diagnosis not present

## 2012-11-22 DIAGNOSIS — M1990 Unspecified osteoarthritis, unspecified site: Secondary | ICD-10-CM | POA: Diagnosis not present

## 2012-11-22 DIAGNOSIS — I10 Essential (primary) hypertension: Secondary | ICD-10-CM | POA: Diagnosis not present

## 2012-11-22 DIAGNOSIS — N318 Other neuromuscular dysfunction of bladder: Secondary | ICD-10-CM | POA: Diagnosis not present

## 2012-11-22 DIAGNOSIS — E785 Hyperlipidemia, unspecified: Secondary | ICD-10-CM | POA: Diagnosis not present

## 2013-01-18 DIAGNOSIS — N952 Postmenopausal atrophic vaginitis: Secondary | ICD-10-CM | POA: Diagnosis not present

## 2013-01-18 DIAGNOSIS — R339 Retention of urine, unspecified: Secondary | ICD-10-CM | POA: Diagnosis not present

## 2013-01-18 DIAGNOSIS — N35919 Unspecified urethral stricture, male, unspecified site: Secondary | ICD-10-CM | POA: Diagnosis not present

## 2013-02-15 DIAGNOSIS — M1710 Unilateral primary osteoarthritis, unspecified knee: Secondary | ICD-10-CM | POA: Diagnosis not present

## 2013-02-16 DIAGNOSIS — H2510 Age-related nuclear cataract, unspecified eye: Secondary | ICD-10-CM | POA: Diagnosis not present

## 2013-05-08 DIAGNOSIS — Z1231 Encounter for screening mammogram for malignant neoplasm of breast: Secondary | ICD-10-CM | POA: Diagnosis not present

## 2013-05-25 DIAGNOSIS — H903 Sensorineural hearing loss, bilateral: Secondary | ICD-10-CM | POA: Diagnosis not present

## 2013-05-25 DIAGNOSIS — H6120 Impacted cerumen, unspecified ear: Secondary | ICD-10-CM | POA: Diagnosis not present

## 2013-05-31 DIAGNOSIS — Z23 Encounter for immunization: Secondary | ICD-10-CM | POA: Diagnosis not present

## 2013-05-31 DIAGNOSIS — IMO0001 Reserved for inherently not codable concepts without codable children: Secondary | ICD-10-CM | POA: Diagnosis not present

## 2013-05-31 DIAGNOSIS — Z1331 Encounter for screening for depression: Secondary | ICD-10-CM | POA: Diagnosis not present

## 2013-05-31 DIAGNOSIS — I872 Venous insufficiency (chronic) (peripheral): Secondary | ICD-10-CM | POA: Diagnosis not present

## 2013-05-31 DIAGNOSIS — I10 Essential (primary) hypertension: Secondary | ICD-10-CM | POA: Diagnosis not present

## 2013-05-31 DIAGNOSIS — E785 Hyperlipidemia, unspecified: Secondary | ICD-10-CM | POA: Diagnosis not present

## 2013-05-31 DIAGNOSIS — Z6832 Body mass index (BMI) 32.0-32.9, adult: Secondary | ICD-10-CM | POA: Diagnosis not present

## 2013-05-31 DIAGNOSIS — N318 Other neuromuscular dysfunction of bladder: Secondary | ICD-10-CM | POA: Diagnosis not present

## 2013-06-28 DIAGNOSIS — M1710 Unilateral primary osteoarthritis, unspecified knee: Secondary | ICD-10-CM | POA: Diagnosis not present

## 2013-08-03 DIAGNOSIS — N952 Postmenopausal atrophic vaginitis: Secondary | ICD-10-CM | POA: Diagnosis not present

## 2013-08-03 DIAGNOSIS — N35919 Unspecified urethral stricture, male, unspecified site: Secondary | ICD-10-CM | POA: Diagnosis not present

## 2013-08-03 DIAGNOSIS — R82998 Other abnormal findings in urine: Secondary | ICD-10-CM | POA: Diagnosis not present

## 2013-08-03 DIAGNOSIS — R339 Retention of urine, unspecified: Secondary | ICD-10-CM | POA: Diagnosis not present

## 2013-08-04 ENCOUNTER — Other Ambulatory Visit: Payer: Self-pay | Admitting: Urology

## 2013-08-21 ENCOUNTER — Encounter (HOSPITAL_BASED_OUTPATIENT_CLINIC_OR_DEPARTMENT_OTHER): Payer: Self-pay | Admitting: *Deleted

## 2013-08-22 ENCOUNTER — Encounter (HOSPITAL_BASED_OUTPATIENT_CLINIC_OR_DEPARTMENT_OTHER): Payer: Self-pay | Admitting: *Deleted

## 2013-08-22 NOTE — Progress Notes (Signed)
NPO AFTER MN.  ARRIVE AT 0730.  NEEDS ISTAT AND EKG.   

## 2013-08-23 NOTE — H&P (Signed)
ctive Problems Problems   1. Atrophic vaginitis (627.3)  2. Incomplete bladder emptying (788.21)  3. Labial fusion (752.49)  4. Nocturia (788.43)  5. Urethral stricture (598.9)  History of Present Illness   Tara Bennett returns today in f/u.  She has a history of a urethral stricture with prior dilation.  She has had increased symptoms with frequent small voids.  She has no dysuria.  She has a PVR of 170cc which is up from 29cc in June.   He stream has slowed down.  She has had no hematuria.  She remains on Estrace cream for atrophic vaginitis.   Past Medical History Problems   1. History of Arthritis (V13.4)  2. History of hypercholesterolemia (V12.29)  3. History of hypertension (V12.59)  4. History of Straining on urination (788.65)  5. History of Urge incontinence of urine (788.31)  6. History of Urinary retention (788.20)  Surgical History Problems   1. History of Cystoscopy For Urethral Stricture  2. History of Cystoscopy For Urethral Stricture  3. History of Dilation Of Female Urethra  Current Meds  1. Advil TABS;  Therapy: (Recorded:23Nov2011) to Recorded  2. Aspirin 81 MG Oral Tablet; Take 1 tablet daily;  Therapy: (Recorded:23Nov2011) to Recorded  3. Calcium 600 + D TABS;  Therapy: (Recorded:23Nov2011) to Recorded  4. Diovan HCT 160-12.5 MG Oral Tablet;  Therapy: [id number] to Recorded  5. Estrace 0.1 MG/GM Vaginal Cream; APPLY/RUB 1/2 INCH TO VAGINAL OPENING &  URETHRAL AREA;  Therapy: [id number] to (Last [phone])  Requested for: 54OEV0350 Ordered  6. Fish Oil 1000 MG Oral Capsule;  Therapy: (Recorded:23Nov2011) to Recorded  7. Furosemide 40 MG Oral Tablet;  Therapy: 23Jun2011 to Recorded  8. Glucosamine CAPS;  Therapy: (Recorded:15Apr2013) to Recorded  9. Klor-Con M20 20 MEQ Oral Tablet Extended Release;  Therapy: 23Jun2011 to Recorded  10. Multiple Vitamin TABS;   Therapy: (Recorded:23Nov2011) to Recorded  11. Simvastatin 20 MG Oral Tablet;   Therapy:  09FGH8299 to Recorded  12. Tylenol TABS;   Therapy: (Recorded:23Nov2011) to Recorded  13. Vitamin D 1000 UNIT Oral Tablet; Take 1 tablet daily;   Therapy: (Recorded:23Nov2011) to Recorded  Allergies Medication   1. Detrol TABS  2. Ditropan XL TBCR  Family History Problems   1. Family history of Death In The Family Father : Mother   Age 76  2. Family history of Death In The Family Mother : Mother   Age 31 CVA  80. Family history of Stroke Syndrome (V17.1) : Mother  Social History Problems   1. Denied: History of Alcohol Use  2. Caffeine Use   2-3/day  3. Former smoker (V15.82)   2-3 cigarettes socially. None since 1965  4. Marital History - Single  Review of Systems  Gastrointestinal: no abdominal pain.  Constitutional: no fever.  Cardiovascular: leg swelling, but no chest pain.  Respiratory: no shortness of breath.    Vitals Vital Signs [Data Includes: Last 1 Day]  Recorded: 37JIR6789 03:41PM  Blood Pressure: 120 / 81 Temperature: 97.7 F Heart Rate: 65  Results/Data Urine [Data Includes: Last 1 Day]   [id number]  COLOR YELLOW   APPEARANCE CLEAR   SPECIFIC GRAVITY 1.015   pH 6.5   GLUCOSE NEG mg/dL  BILIRUBIN NEG   KETONE NEG mg/dL  BLOOD NEG   PROTEIN NEG mg/dL  UROBILINOGEN 0.2 mg/dL  NITRITE NEG   LEUKOCYTE ESTERASE TRACE   SQUAMOUS EPITHELIAL/HPF FEW   WBC 3-6 WBC/hpf  RBC NONE SEEN RBC/hpf  BACTERIA FEW   CRYSTALS NONE  SEEN   CASTS NONE SEEN    PVR: Ultrasound PVR 170 ml.    Assessment Assessed   1. Incomplete bladder emptying (788.21)  2. Urethral stricture (598.9)  3. Atrophic vaginitis (627.3)  4. Pyuria (791.9)      Tara Bennett has recurrent voiding symptoms with an elevated PVR and needs dilation but can't tolerate it in the office.  She continues to use the estrace. She has mild pyuria and a few bacteria on an unspun specimen and could have a UTI.   Plan Health Maintenance   1. UA With REFLEX; [Do Not Release]; Status:Resulted -  Requires Verification;   Done:  [id number] 03:52PM Incomplete bladder emptying   2. PVR U/S; Status:Complete;   Done: 05LZJ6734 Pyuria   3. URINE CULTURE; Status:Hold For - Specimen/Data Collection,Appointment; Requested  for:18Dec2014;  Urethral stricture   4. Follow-up Schedule Surgery Office  Follow-up  Status: Hold For - Appointment   Requested for: [phone]      I will get her set up for outpatient cystoscopy and dilation and reviewed the risks of bleeding, infection, thrombotic events and anesthetic complications. Urine culture and I will treat if positive.   Discussion/Summary   CC: Dr. Berneta Sages.

## 2013-08-24 ENCOUNTER — Ambulatory Visit (HOSPITAL_BASED_OUTPATIENT_CLINIC_OR_DEPARTMENT_OTHER)
Admission: RE | Admit: 2013-08-24 | Discharge: 2013-08-24 | Disposition: A | Discharge status: Home / Self Care | Payer: Medicare Other | Source: Ambulatory Visit | Attending: Urology | Admitting: Urology

## 2013-08-24 ENCOUNTER — Encounter (HOSPITAL_BASED_OUTPATIENT_CLINIC_OR_DEPARTMENT_OTHER): Payer: Self-pay | Admitting: *Deleted

## 2013-08-24 ENCOUNTER — Encounter (HOSPITAL_BASED_OUTPATIENT_CLINIC_OR_DEPARTMENT_OTHER)
Admission: RE | Disposition: A | Discharge status: Home / Self Care | Payer: Self-pay | Source: Ambulatory Visit | Attending: Urology

## 2013-08-24 ENCOUNTER — Ambulatory Visit (HOSPITAL_BASED_OUTPATIENT_CLINIC_OR_DEPARTMENT_OTHER): Payer: Medicare Other | Admitting: Anesthesiology

## 2013-08-24 ENCOUNTER — Encounter (HOSPITAL_BASED_OUTPATIENT_CLINIC_OR_DEPARTMENT_OTHER): Payer: Medicare Other | Admitting: Anesthesiology

## 2013-08-24 ENCOUNTER — Other Ambulatory Visit: Payer: Self-pay

## 2013-08-24 DIAGNOSIS — R3916 Straining to void: Secondary | ICD-10-CM | POA: Insufficient documentation

## 2013-08-24 DIAGNOSIS — R351 Nocturia: Secondary | ICD-10-CM | POA: Diagnosis not present

## 2013-08-24 DIAGNOSIS — Z7982 Long term (current) use of aspirin: Secondary | ICD-10-CM | POA: Diagnosis not present

## 2013-08-24 DIAGNOSIS — N35919 Unspecified urethral stricture, male, unspecified site: Secondary | ICD-10-CM | POA: Diagnosis not present

## 2013-08-24 DIAGNOSIS — R339 Retention of urine, unspecified: Secondary | ICD-10-CM | POA: Insufficient documentation

## 2013-08-24 DIAGNOSIS — E7800 Pure hypercholesterolemia, unspecified: Secondary | ICD-10-CM | POA: Insufficient documentation

## 2013-08-24 DIAGNOSIS — M129 Arthropathy, unspecified: Secondary | ICD-10-CM | POA: Insufficient documentation

## 2013-08-24 DIAGNOSIS — Z87891 Personal history of nicotine dependence: Secondary | ICD-10-CM | POA: Insufficient documentation

## 2013-08-24 DIAGNOSIS — Z79899 Other long term (current) drug therapy: Secondary | ICD-10-CM | POA: Insufficient documentation

## 2013-08-24 DIAGNOSIS — Q5270 Unspecified congenital malformations of vulva: Secondary | ICD-10-CM

## 2013-08-24 DIAGNOSIS — I10 Essential (primary) hypertension: Secondary | ICD-10-CM | POA: Diagnosis not present

## 2013-08-24 DIAGNOSIS — N952 Postmenopausal atrophic vaginitis: Secondary | ICD-10-CM | POA: Insufficient documentation

## 2013-08-24 DIAGNOSIS — Q519 Congenital malformation of uterus and cervix, unspecified: Secondary | ICD-10-CM | POA: Diagnosis not present

## 2013-08-24 DIAGNOSIS — N3941 Urge incontinence: Secondary | ICD-10-CM | POA: Diagnosis not present

## 2013-08-24 DIAGNOSIS — Q524 Other congenital malformations of vagina: Secondary | ICD-10-CM

## 2013-08-24 HISTORY — PX: CYSTOSCOPY WITH URETHRAL DILATATION: SHX5125

## 2013-08-24 HISTORY — DX: Presence of external hearing-aid: Z97.4

## 2013-08-24 LAB — POCT I-STAT, CHEM 8
BUN: 13 mg/dL (ref 6–23)
CHLORIDE: 103 meq/L (ref 96–112)
Calcium, Ion: 1.31 mmol/L — ABNORMAL HIGH (ref 1.13–1.30)
Creatinine, Ser: 0.9 mg/dL (ref 0.50–1.10)
Glucose, Bld: 129 mg/dL — ABNORMAL HIGH (ref 70–99)
HEMATOCRIT: 43 % (ref 36.0–46.0)
Hemoglobin: 14.6 g/dL (ref 12.0–15.0)
POTASSIUM: 3.9 meq/L (ref 3.7–5.3)
SODIUM: 141 meq/L (ref 137–147)
TCO2: 25 mmol/L (ref 0–100)

## 2013-08-24 SURGERY — CYSTOSCOPY, WITH URETHRAL DILATION
Anesthesia: Monitor Anesthesia Care | Site: Bladder

## 2013-08-24 MED ORDER — PHENAZOPYRIDINE HCL 200 MG PO TABS: 200.0000 mg | ORAL_TABLET | Freq: Three times a day (TID) | ORAL | Status: DC | PRN

## 2013-08-24 MED ORDER — LACTATED RINGERS IV SOLN
INTRAVENOUS | Status: DC
  Administered 2013-08-24 (×2): via INTRAVENOUS
  Filled 2013-08-24: qty 1000

## 2013-08-24 MED ORDER — ACETAMINOPHEN 650 MG RE SUPP
650.0000 mg | RECTAL | Status: DC | PRN
  Filled 2013-08-24: qty 1

## 2013-08-24 MED ORDER — ONDANSETRON HCL 4 MG/2ML IJ SOLN
4.0000 mg | Freq: Four times a day (QID) | INTRAMUSCULAR | Status: DC | PRN
  Filled 2013-08-24: qty 2

## 2013-08-24 MED ORDER — OXYCODONE HCL 5 MG PO TABS
5.0000 mg | ORAL_TABLET | ORAL | Status: DC | PRN
  Filled 2013-08-24: qty 2

## 2013-08-24 MED ORDER — ONDANSETRON HCL 4 MG/2ML IJ SOLN
INTRAMUSCULAR | Status: DC | PRN
Start: 2013-08-24 — End: 2013-08-24
  Administered 2013-08-24: 4 mg via INTRAVENOUS

## 2013-08-24 MED ORDER — CIPROFLOXACIN IN D5W 400 MG/200ML IV SOLN
400.0000 mg | INTRAVENOUS | Status: AC
  Administered 2013-08-24: 400 mg via INTRAVENOUS
  Filled 2013-08-24 (×2): qty 200

## 2013-08-24 MED ORDER — SODIUM CHLORIDE 0.9 % IJ SOLN
3.0000 mL | Freq: Two times a day (BID) | INTRAMUSCULAR | Status: DC
  Filled 2013-08-24: qty 3

## 2013-08-24 MED ORDER — LIDOCAINE HCL (CARDIAC) 20 MG/ML IV SOLN
INTRAVENOUS | Status: DC | PRN
  Administered 2013-08-24: 50 mg via INTRAVENOUS

## 2013-08-24 MED ORDER — FENTANYL CITRATE 0.05 MG/ML IJ SOLN
INTRAMUSCULAR | Status: DC | PRN
  Administered 2013-08-24 (×4): 25 ug via INTRAVENOUS

## 2013-08-24 MED ORDER — SODIUM CHLORIDE 0.9 % IJ SOLN
3.0000 mL | INTRAMUSCULAR | Status: DC | PRN
  Filled 2013-08-24: qty 3

## 2013-08-24 MED ORDER — FENTANYL CITRATE 0.05 MG/ML IJ SOLN
25.0000 ug | INTRAMUSCULAR | Status: DC | PRN
  Filled 2013-08-24: qty 1

## 2013-08-24 MED ORDER — PROPOFOL 10 MG/ML IV EMUL
INTRAVENOUS | Status: DC | PRN
  Administered 2013-08-24 (×2): 100 ug/kg/min via INTRAVENOUS

## 2013-08-24 MED ORDER — STERILE WATER FOR IRRIGATION IR SOLN
Status: DC | PRN
  Administered 2013-08-24: 3000 mL

## 2013-08-24 MED ORDER — SODIUM CHLORIDE 0.9 % IV SOLN
250.0000 mL | INTRAVENOUS | Status: DC | PRN
  Filled 2013-08-24: qty 250

## 2013-08-24 MED ORDER — PROPOFOL 10 MG/ML IV BOLUS
INTRAVENOUS | Status: DC | PRN
  Administered 2013-08-24 (×2): 20 mg via INTRAVENOUS

## 2013-08-24 MED ORDER — ACETAMINOPHEN 325 MG PO TABS
650.0000 mg | ORAL_TABLET | ORAL | Status: DC | PRN
  Filled 2013-08-24: qty 2

## 2013-08-24 MED ORDER — BELLADONNA ALKALOIDS-OPIUM 16.2-60 MG RE SUPP
1.0000 | Freq: Every day | RECTAL | Status: DC
  Filled 2013-08-24 (×2): qty 1

## 2013-08-24 MED ORDER — MORPHINE SULFATE 2 MG/ML IJ SOLN
1.0000 mg | INTRAMUSCULAR | Status: DC | PRN
  Filled 2013-08-24: qty 1

## 2013-08-24 MED ORDER — FENTANYL CITRATE 0.05 MG/ML IJ SOLN
INTRAMUSCULAR | Status: AC
  Filled 2013-08-24: qty 2

## 2013-08-24 SURGICAL SUPPLY — 29 items
BAG DRAIN URO-CYSTO SKYTR STRL (DRAIN) ×3 IMPLANT
BAG DRN UROCATH (DRAIN) ×1
BALLN NEPHROSTOMY (BALLOONS)
BALLOON NEPHROSTOMY (BALLOONS) ×1 IMPLANT
CANISTER SUCT LVC 12 LTR MEDI- (MISCELLANEOUS) ×2 IMPLANT
CATH FOLEY 2WAY SLVR  5CC 16FR (CATHETERS)
CATH FOLEY 2WAY SLVR 5CC 16FR (CATHETERS) IMPLANT
CLOTH BEACON ORANGE TIMEOUT ST (SAFETY) ×3 IMPLANT
DRAPE CAMERA CLOSED 9X96 (DRAPES) ×3 IMPLANT
ELECT REM PT RETURN 9FT ADLT (ELECTROSURGICAL) ×3
ELECTRODE REM PT RTRN 9FT ADLT (ELECTROSURGICAL) ×1 IMPLANT
GLOVE BIOGEL M STER SZ 6 (GLOVE) ×3 IMPLANT
GLOVE BIOGEL PI IND STRL 6.5 (GLOVE) ×2 IMPLANT
GLOVE BIOGEL PI INDICATOR 6.5 (GLOVE) ×4
GLOVE SURG SS PI 8.0 STRL IVOR (GLOVE) ×3 IMPLANT
GOWN PREVENTION PLUS LG XLONG (DISPOSABLE) ×1 IMPLANT
GOWN STRL REIN XL XLG (GOWN DISPOSABLE) IMPLANT
GOWN STRL REUS W/ TWL LRG LVL3 (GOWN DISPOSABLE) ×1 IMPLANT
GOWN STRL REUS W/TWL LRG LVL3 (GOWN DISPOSABLE) ×3
GOWN STRL REUS W/TWL XL LVL3 (GOWN DISPOSABLE) ×2 IMPLANT
GUIDEWIRE STR DUAL SENSOR (WIRE) IMPLANT
NDL SAFETY ECLIPSE 18X1.5 (NEEDLE) IMPLANT
NEEDLE HYPO 18GX1.5 SHARP (NEEDLE)
NEEDLE HYPO 22GX1.5 SAFETY (NEEDLE) IMPLANT
NS IRRIG 500ML POUR BTL (IV SOLUTION) IMPLANT
PACK CYSTOSCOPY (CUSTOM PROCEDURE TRAY) ×3 IMPLANT
SYR 20CC LL (SYRINGE) IMPLANT
SYR 30ML LL (SYRINGE) IMPLANT
WATER STERILE IRR 3000ML UROMA (IV SOLUTION) ×3 IMPLANT

## 2013-08-24 NOTE — Interval H&P Note (Signed)
History and Physical Interval Note:  She is voiding better today but would still like to proceed.   08/24/2013 8:55 AM  Domenic Moras  has presented today for surgery, with the diagnosis of Urethral Stenosis  The various methods of treatment have been discussed with the patient and family. After consideration of risks, benefits and other options for treatment, the patient has consented to  Procedure(s): CYSTOSCOPY WITH URETHRAL DILATATION (N/A) as a surgical intervention .  The patient's history has been reviewed, patient examined, no change in status, stable for surgery.  I have reviewed the patient's chart and labs.  Questions were answered to the patient's satisfaction.     Keola Heninger J

## 2013-08-24 NOTE — Anesthesia Postprocedure Evaluation (Signed)
  Anesthesia Post-op Note  Patient: Tara Bennett  Procedure(s) Performed: Procedure(s) (LRB): CYSTOSCOPY WITH URETHRAL DILATATION (N/A)  Patient Location: PACU  Anesthesia Type: MAC  Level of Consciousness: awake and alert   Airway and Oxygen Therapy: Patient Spontanous Breathing  Post-op Pain: mild  Post-op Assessment: Post-op Vital signs reviewed, Patient's Cardiovascular Status Stable, Respiratory Function Stable, Patent Airway and No signs of Nausea or vomiting  Last Vitals:  Filed Vitals:   08/24/13 1150  BP: 170/88  Pulse: 67  Temp: 36.1 C  Resp: 18    Post-op Vital Signs: stable   Complications: No apparent anesthesia complications

## 2013-08-24 NOTE — Anesthesia Preprocedure Evaluation (Signed)
Anesthesia Evaluation  Patient identified by MRN, date of birth, ID band Patient awake    Reviewed: Allergy & Precautions, H&P , NPO status , Patient's Chart, lab work & pertinent test results  Airway Mallampati: II TM Distance: >3 FB Neck ROM: Full    Dental no notable dental hx.    Pulmonary former smoker,  breath sounds clear to auscultation  Pulmonary exam normal       Cardiovascular Exercise Tolerance: Good hypertension, Pt. on medications negative cardio ROS  Rhythm:Regular Rate:Normal     Neuro/Psych negative neurological ROS  negative psych ROS   GI/Hepatic negative GI ROS, Neg liver ROS,   Endo/Other  negative endocrine ROS  Renal/GU negative Renal ROS  negative genitourinary   Musculoskeletal negative musculoskeletal ROS (+)   Abdominal   Peds negative pediatric ROS (+)  Hematology negative hematology ROS (+)   Anesthesia Other Findings   Reproductive/Obstetrics negative OB ROS                           Anesthesia Physical Anesthesia Plan  ASA: II  Anesthesia Plan: MAC   Post-op Pain Management:    Induction: Intravenous  Airway Management Planned:   Additional Equipment:   Intra-op Plan:   Post-operative Plan:   Informed Consent: I have reviewed the patients History and Physical, chart, labs and discussed the procedure including the risks, benefits and alternatives for the proposed anesthesia with the patient or authorized representative who has indicated his/her understanding and acceptance.   Dental advisory given  Plan Discussed with: CRNA  Anesthesia Plan Comments:         Anesthesia Quick Evaluation

## 2013-08-24 NOTE — Discharge Instructions (Addendum)
Cystoscopy, Care After °Refer to this sheet in the next few weeks. These instructions provide you with information on caring for yourself after your procedure. Your caregiver may also give you more specific instructions. Your treatment has been planned according to current medical practices, but problems sometimes occur. Call your caregiver if you have any problems or questions after your procedure. °HOME CARE INSTRUCTIONS  °Things you can do to ease any discomfort after your procedure include: °· Drinking enough water and fluids to keep your urine clear or pale yellow. °· Taking a warm bath to relieve any burning feelings. °SEEK IMMEDIATE MEDICAL CARE IF:  °· You have an increase in blood in your urine. °· You notice blood clots in your urine. °· You have difficulty passing urine. °· You have the chills. °· You have abdominal pain. °· You have a fever or persistent symptoms for more than 2 3 days. °· You have a fever and your symptoms suddenly get worse. °MAKE SURE YOU:  °· Understand these instructions. °· Will watch your condition. °· Will get help right away if you are not doing well or get worse. °Document Released: 02/20/2005 Document Revised: 04/05/2013 Document Reviewed: 01/25/2012 °ExitCare® Patient Information ©2014 [employer]. ° °Post Anesthesia Home Care Instructions ° °Activity: °Get plenty of rest for the remainder of the day. A responsible adult should stay with you for 24 hours following the procedure.  °For the next 24 hours, DO NOT: °-Drive a car °-Operate machinery °-Drink alcoholic beverages °-Take any medication unless instructed by your physician °-Make any legal decisions or sign important papers. ° °Meals: °Start with liquid foods such as gelatin or soup. Progress to regular foods as tolerated. Avoid greasy, spicy, heavy foods. If nausea and/or vomiting occur, drink only clear liquids until the nausea and/or vomiting subsides. Call your physician if vomiting continues. ° °Special  Instructions/Symptoms: °Your throat may feel dry or sore from the anesthesia or the breathing tube placed in your throat during surgery. If this causes discomfort, gargle with warm salt water. The discomfort should disappear within 24 hours. ° °

## 2013-08-24 NOTE — Transfer of Care (Signed)
Immediate Anesthesia Transfer of Care Note  Patient: Tara Bennett  Procedure(s) Performed: Procedure(s) (LRB): CYSTOSCOPY WITH URETHRAL DILATATION (N/A)  Patient Location: PACU  Anesthesia Type:MAC  Level of Consciousness: awake, alert  and oriented  Airway & Oxygen Therapy: Patient Spontanous Breathing and Patient connected to face mask oxygen  Post-op Assessment: Report given to PACU RN and Post -op Vital signs reviewed and stable  Post vital signs: Reviewed and stable  Complications: No apparent anesthesia complications

## 2013-08-24 NOTE — Brief Op Note (Signed)
08/24/2013  9:28 AM  PATIENT:  Tara Bennett  80 y.o. female  PRE-OPERATIVE DIAGNOSIS:  Urethral Stenosis  POST-OPERATIVE DIAGNOSIS:  Urethral Stenosis  PROCEDURE:  Procedure(s): CYSTOSCOPY WITH URETHRAL DILATATION (N/A)  SURGEON:  Surgeon(s) and Role:    * Irine Seal, MD - Primary  PHYSICIAN ASSISTANT:   ASSISTANTS: none   ANESTHESIA:   MAC  EBL:  Total I/O In: 200 [I.V.:200] Out: -   BLOOD ADMINISTERED:none  DRAINS: none   LOCAL MEDICATIONS USED:  NONE  SPECIMEN:  No Specimen  DISPOSITION OF SPECIMEN:  N/A  COUNTS:  YES  TOURNIQUET:  * No tourniquets in log *  DICTATION: .Other Dictation: Dictation Number [id number]  PLAN OF CARE: Discharge to home after PACU  PATIENT DISPOSITION:  PACU - hemodynamically stable.   Delay start of Pharmacological VTE agent (>24hrs) due to surgical blood loss or risk of bleeding: not applicable

## 2013-08-25 ENCOUNTER — Encounter (HOSPITAL_BASED_OUTPATIENT_CLINIC_OR_DEPARTMENT_OTHER): Payer: Self-pay | Admitting: Urology

## 2013-08-25 NOTE — Op Note (Signed)
[name], [name] NO.:  [id number]  MEDICAL RECORD NO.:  [id number]  LOCATION:                                 FACILITY:  PHYSICIAN:  Marshall Cork. Jeffie Pollock, M.D.    DATE OF BIRTH:  03/23/1928  DATE OF PROCEDURE:  08/24/2013 DATE OF DISCHARGE:                              OPERATIVE REPORT   PROCEDURE:  Cystoscopy with urethral dilation.  PREOPERATIVE DIAGNOSIS:  Urethral stenosis.  POSTOPERATIVE DIAGNOSIS:  Urethral stenosis.  SURGEON:  Marshall Cork. Jeffie Pollock, MD  ANESTHESIA:  MAC.  SPECIMEN:  None.  DRAINS:  None.  COMPLICATIONS:  None.  INDICATION:  Ms. Tara Bennett is an 80 year old white female with a history of urethral stenosis.  She has had increased difficulty voiding. Recently, she declined dilation in the office because of discomfort and presents today for that procedure.  FINDINGS OF PROCEDURE:  She was given Cipro.  She was taken to the operating room where she was placed in lithotomy position and fitted with PAS hose.  Sedation was given as needed.  Her perineum and genitalia were prepped with Betadine solution.  She was draped in usual sterile fashion.  Initial attempt to pass the cystoscope was unsuccessful due to urethral stenosis.  She was then dilated with female sounds from 69 to 14 [place]. She was tight at 22, but disrupted and then dilated easily beyond that.  Cystoscopy was then performed with a 22-French scope and 12-degree lens. Examination revealed now patent urethra with mucosal tearing distally at 6 o'clock from the dilation.  The bladder wall had severe trabeculation with multiple cellules and small diverticula, but no tumor, stones, or inflammation were noted and ureteral orifices were unremarkable, effluxing clear urine.  At this point, the bladder was drained.  The patient was taken down from lithotomy position.  Her anesthetic was reversed.  She was moved to the recovery room in stable condition.  There were no  complications.     Marshall Cork. Jeffie Pollock, M.D.     JJW/MEDQ  D:  08/24/2013  T:  08/24/2013  Job:  [id number]

## 2013-09-05 DIAGNOSIS — R339 Retention of urine, unspecified: Secondary | ICD-10-CM | POA: Diagnosis not present

## 2013-09-05 DIAGNOSIS — N35919 Unspecified urethral stricture, male, unspecified site: Secondary | ICD-10-CM | POA: Diagnosis not present

## 2013-12-04 DIAGNOSIS — I872 Venous insufficiency (chronic) (peripheral): Secondary | ICD-10-CM | POA: Diagnosis not present

## 2013-12-04 DIAGNOSIS — M810 Age-related osteoporosis without current pathological fracture: Secondary | ICD-10-CM | POA: Diagnosis not present

## 2013-12-04 DIAGNOSIS — Z6831 Body mass index (BMI) 31.0-31.9, adult: Secondary | ICD-10-CM | POA: Diagnosis not present

## 2013-12-04 DIAGNOSIS — J301 Allergic rhinitis due to pollen: Secondary | ICD-10-CM | POA: Diagnosis not present

## 2013-12-04 DIAGNOSIS — I10 Essential (primary) hypertension: Secondary | ICD-10-CM | POA: Diagnosis not present

## 2013-12-04 DIAGNOSIS — M1990 Unspecified osteoarthritis, unspecified site: Secondary | ICD-10-CM | POA: Diagnosis not present

## 2013-12-04 DIAGNOSIS — E785 Hyperlipidemia, unspecified: Secondary | ICD-10-CM | POA: Diagnosis not present

## 2013-12-27 DIAGNOSIS — M1710 Unilateral primary osteoarthritis, unspecified knee: Secondary | ICD-10-CM | POA: Diagnosis not present

## 2014-02-07 DIAGNOSIS — M1710 Unilateral primary osteoarthritis, unspecified knee: Secondary | ICD-10-CM | POA: Diagnosis not present

## 2014-02-19 DIAGNOSIS — R339 Retention of urine, unspecified: Secondary | ICD-10-CM | POA: Diagnosis not present

## 2014-02-19 DIAGNOSIS — N952 Postmenopausal atrophic vaginitis: Secondary | ICD-10-CM | POA: Diagnosis not present

## 2014-02-19 DIAGNOSIS — N35919 Unspecified urethral stricture, male, unspecified site: Secondary | ICD-10-CM | POA: Diagnosis not present

## 2014-04-02 DIAGNOSIS — H2510 Age-related nuclear cataract, unspecified eye: Secondary | ICD-10-CM | POA: Diagnosis not present

## 2014-05-02 DIAGNOSIS — M25569 Pain in unspecified knee: Secondary | ICD-10-CM | POA: Diagnosis not present

## 2014-05-02 DIAGNOSIS — M1710 Unilateral primary osteoarthritis, unspecified knee: Secondary | ICD-10-CM | POA: Diagnosis not present

## 2014-05-30 DIAGNOSIS — R351 Nocturia: Secondary | ICD-10-CM | POA: Diagnosis not present

## 2014-05-30 DIAGNOSIS — N359 Urethral stricture, unspecified: Secondary | ICD-10-CM | POA: Diagnosis not present

## 2014-05-30 DIAGNOSIS — R339 Retention of urine, unspecified: Secondary | ICD-10-CM | POA: Diagnosis not present

## 2014-05-31 DIAGNOSIS — Z23 Encounter for immunization: Secondary | ICD-10-CM | POA: Diagnosis not present

## 2014-05-31 DIAGNOSIS — N318 Other neuromuscular dysfunction of bladder: Secondary | ICD-10-CM | POA: Diagnosis not present

## 2014-05-31 DIAGNOSIS — M1990 Unspecified osteoarthritis, unspecified site: Secondary | ICD-10-CM | POA: Diagnosis not present

## 2014-05-31 DIAGNOSIS — I872 Venous insufficiency (chronic) (peripheral): Secondary | ICD-10-CM | POA: Diagnosis not present

## 2014-05-31 DIAGNOSIS — E785 Hyperlipidemia, unspecified: Secondary | ICD-10-CM | POA: Diagnosis not present

## 2014-05-31 DIAGNOSIS — I10 Essential (primary) hypertension: Secondary | ICD-10-CM | POA: Diagnosis not present

## 2014-05-31 DIAGNOSIS — Z6832 Body mass index (BMI) 32.0-32.9, adult: Secondary | ICD-10-CM | POA: Diagnosis not present

## 2014-11-28 DIAGNOSIS — N3941 Urge incontinence: Secondary | ICD-10-CM | POA: Diagnosis not present

## 2014-11-28 DIAGNOSIS — R351 Nocturia: Secondary | ICD-10-CM | POA: Diagnosis not present

## 2014-11-28 DIAGNOSIS — N359 Urethral stricture, unspecified: Secondary | ICD-10-CM | POA: Diagnosis not present

## 2014-12-10 DIAGNOSIS — M1990 Unspecified osteoarthritis, unspecified site: Secondary | ICD-10-CM | POA: Diagnosis not present

## 2014-12-10 DIAGNOSIS — Z23 Encounter for immunization: Secondary | ICD-10-CM | POA: Diagnosis not present

## 2014-12-10 DIAGNOSIS — I872 Venous insufficiency (chronic) (peripheral): Secondary | ICD-10-CM | POA: Diagnosis not present

## 2014-12-10 DIAGNOSIS — E785 Hyperlipidemia, unspecified: Secondary | ICD-10-CM | POA: Diagnosis not present

## 2014-12-10 DIAGNOSIS — J302 Other seasonal allergic rhinitis: Secondary | ICD-10-CM | POA: Diagnosis not present

## 2014-12-10 DIAGNOSIS — M810 Age-related osteoporosis without current pathological fracture: Secondary | ICD-10-CM | POA: Diagnosis not present

## 2014-12-10 DIAGNOSIS — N318 Other neuromuscular dysfunction of bladder: Secondary | ICD-10-CM | POA: Diagnosis not present

## 2014-12-10 DIAGNOSIS — Z1389 Encounter for screening for other disorder: Secondary | ICD-10-CM | POA: Diagnosis not present

## 2014-12-10 DIAGNOSIS — Z6832 Body mass index (BMI) 32.0-32.9, adult: Secondary | ICD-10-CM | POA: Diagnosis not present

## 2014-12-10 DIAGNOSIS — I10 Essential (primary) hypertension: Secondary | ICD-10-CM | POA: Diagnosis not present

## 2015-04-04 DIAGNOSIS — H25013 Cortical age-related cataract, bilateral: Secondary | ICD-10-CM | POA: Diagnosis not present

## 2015-04-04 DIAGNOSIS — H2513 Age-related nuclear cataract, bilateral: Secondary | ICD-10-CM | POA: Diagnosis not present

## 2015-04-15 DIAGNOSIS — H903 Sensorineural hearing loss, bilateral: Secondary | ICD-10-CM | POA: Diagnosis not present

## 2015-04-15 DIAGNOSIS — H6123 Impacted cerumen, bilateral: Secondary | ICD-10-CM | POA: Diagnosis not present

## 2015-04-15 DIAGNOSIS — H61302 Acquired stenosis of left external ear canal, unspecified: Secondary | ICD-10-CM | POA: Diagnosis not present

## 2015-04-25 ENCOUNTER — Emergency Department (HOSPITAL_COMMUNITY)
Admission: EM | Admit: 2015-04-25 | Discharge: 2015-04-25 | Disposition: A | Discharge status: Home / Self Care | Payer: Medicare Other | Attending: Emergency Medicine | Admitting: Emergency Medicine

## 2015-04-25 ENCOUNTER — Encounter (HOSPITAL_COMMUNITY): Payer: Self-pay | Admitting: Emergency Medicine

## 2015-04-25 DIAGNOSIS — Z79899 Other long term (current) drug therapy: Secondary | ICD-10-CM | POA: Diagnosis not present

## 2015-04-25 DIAGNOSIS — Z9889 Other specified postprocedural states: Secondary | ICD-10-CM | POA: Insufficient documentation

## 2015-04-25 DIAGNOSIS — R3919 Other difficulties with micturition: Secondary | ICD-10-CM | POA: Diagnosis not present

## 2015-04-25 DIAGNOSIS — Z7982 Long term (current) use of aspirin: Secondary | ICD-10-CM | POA: Insufficient documentation

## 2015-04-25 DIAGNOSIS — Z87891 Personal history of nicotine dependence: Secondary | ICD-10-CM | POA: Diagnosis not present

## 2015-04-25 DIAGNOSIS — Z87448 Personal history of other diseases of urinary system: Secondary | ICD-10-CM | POA: Diagnosis not present

## 2015-04-25 DIAGNOSIS — M1990 Unspecified osteoarthritis, unspecified site: Secondary | ICD-10-CM | POA: Insufficient documentation

## 2015-04-25 DIAGNOSIS — R339 Retention of urine, unspecified: Secondary | ICD-10-CM | POA: Insufficient documentation

## 2015-04-25 DIAGNOSIS — R350 Frequency of micturition: Secondary | ICD-10-CM | POA: Diagnosis not present

## 2015-04-25 DIAGNOSIS — I10 Essential (primary) hypertension: Secondary | ICD-10-CM | POA: Insufficient documentation

## 2015-04-25 LAB — CBC WITH DIFFERENTIAL/PLATELET
BASOS ABS: 0.1 10*3/uL (ref 0.0–0.1)
Basophils Relative: 1 % (ref 0–1)
Eosinophils Absolute: 0.2 10*3/uL (ref 0.0–0.7)
Eosinophils Relative: 2 % (ref 0–5)
HEMATOCRIT: 42.1 % (ref 36.0–46.0)
HEMOGLOBIN: 14.4 g/dL (ref 12.0–15.0)
LYMPHS PCT: 17 % (ref 12–46)
Lymphs Abs: 1.8 10*3/uL (ref 0.7–4.0)
MCH: 31.2 pg (ref 26.0–34.0)
MCHC: 34.2 g/dL (ref 30.0–36.0)
MCV: 91.1 fL (ref 78.0–100.0)
MONO ABS: 0.6 10*3/uL (ref 0.1–1.0)
Monocytes Relative: 5 % (ref 3–12)
NEUTROS ABS: 8 10*3/uL — AB (ref 1.7–7.7)
Neutrophils Relative %: 75 % (ref 43–77)
Platelets: 203 10*3/uL (ref 150–400)
RBC: 4.62 MIL/uL (ref 3.87–5.11)
RDW: 13.2 % (ref 11.5–15.5)
WBC: 10.7 10*3/uL — ABNORMAL HIGH (ref 4.0–10.5)

## 2015-04-25 LAB — URINALYSIS, ROUTINE W REFLEX MICROSCOPIC
Bilirubin Urine: NEGATIVE
GLUCOSE, UA: NEGATIVE mg/dL
Ketones, ur: NEGATIVE mg/dL
LEUKOCYTES UA: NEGATIVE
Nitrite: NEGATIVE
Protein, ur: NEGATIVE mg/dL
Specific Gravity, Urine: 1.014 (ref 1.005–1.030)
Urobilinogen, UA: 1 mg/dL (ref 0.0–1.0)
pH: 7 (ref 5.0–8.0)

## 2015-04-25 LAB — URINE MICROSCOPIC-ADD ON

## 2015-04-25 LAB — BASIC METABOLIC PANEL
ANION GAP: 11 (ref 5–15)
BUN: 23 mg/dL — ABNORMAL HIGH (ref 6–20)
CHLORIDE: 102 mmol/L (ref 101–111)
CO2: 25 mmol/L (ref 22–32)
Calcium: 9.8 mg/dL (ref 8.9–10.3)
Creatinine, Ser: 0.98 mg/dL (ref 0.44–1.00)
GFR calc Af Amer: 59 mL/min — ABNORMAL LOW (ref >60)
GFR calc non Af Amer: 51 mL/min — ABNORMAL LOW (ref >60)
GLUCOSE: 140 mg/dL — AB (ref 65–99)
POTASSIUM: 4 mmol/L (ref 3.5–5.1)
Sodium: 138 mmol/L (ref 135–145)

## 2015-04-25 NOTE — ED Notes (Signed)
Pt having urinary retention that started around 2 am this morning. Pt states that she will have to go to the bathroom about every 5 mins but only gets out a few drops.

## 2015-04-25 NOTE — ED Provider Notes (Signed)
CSN: [id number]     Arrival date & time 04/25/15  1730 History   First MD Initiated Contact with Patient 04/25/15 1840     Chief Complaint  Patient presents with  . Urinary Retention     (Consider location/radiation/quality/duration/timing/severity/associated sxs/prior Treatment) HPI Comments: Patient presents to the emergency department with chief complaint of urinary retention. She states that the symptoms started early this morning. She states that this happens occasionally, and she has to undergo dilatation by urology. Patient made an appointment with Dr. Jeffie Pollock for tomorrow morning. She states that throughout the day she is been having urinary frequency. She states that she is only able to get out of she drops. She denies abdominal pain. Denies any fevers or chills, dysuria, or hematuria. Denies any other complaints at this time.  The history is provided by the patient. No language interpreter was used.    Past Medical History  Diagnosis Date  . History of urethral stricture     freq/ urge  . Hypertension   . Arthritis   . Wears hearing aid   . Urethral stenosis    Past Surgical History  Procedure Laterality Date  . Cysto/ urethral dilation/ fulgeration of bleeder  10-16-10  . Cystoscopy with urethral dilatation  07/02/2011    Procedure: CYSTOSCOPY WITH URETHRAL DILATATION;  Surgeon: Malka So;  Location: [facility];  Service: Urology;  Laterality: N/A;  . Cystoscopy with urethral dilatation N/A 08/24/2013    Procedure: CYSTOSCOPY WITH URETHRAL DILATATION;  Surgeon: Irine Seal, MD;  Location: [facility];  Service: Urology;  Laterality: N/A;   No family history on file. Social History  Substance Use Topics  . Smoking status: Former Smoker    Quit date: 07/01/1964  . Smokeless tobacco: Never Used  . Alcohol Use: Yes     Comment: very rare   OB History    No data available     Review of Systems  Constitutional: Negative for fever and  chills.  Respiratory: Negative for shortness of breath.   Cardiovascular: Negative for chest pain.  Gastrointestinal: Negative for nausea, vomiting, diarrhea and constipation.  Genitourinary: Positive for frequency and difficulty urinating. Negative for dysuria.      Allergies  Furosemide  Home Medications   Prior to Admission medications   Medication Sig Start Date End Date Taking? Authorizing Provider  acetaminophen (TYLENOL) 325 MG tablet Take 650 mg by mouth every 6 (six) hours as needed for moderate pain.    Yes Historical Provider, MD  acetaminophen (TYLENOL) 650 MG CR tablet Take 650 mg by mouth every 8 (eight) hours as needed for pain.   Yes Historical Provider, MD  aspirin 81 MG tablet Take 81 mg by mouth daily as needed for pain.    Yes Historical Provider, MD  calcium citrate-vitamin D 200-200 MG-UNIT TABS Take 1 tablet by mouth 3 (three) times daily.    Yes Historical Provider, MD  diclofenac sodium (VOLTAREN) 1 % GEL Apply 2 g topically at bedtime.  01/26/15  Yes Historical Provider, MD  fish oil-omega-3 fatty acids 1000 MG capsule Take 2 g by mouth daily.     Yes Historical Provider, MD  Multiple Vitamins-Minerals (CENTRUM SILVER PO) Take by mouth daily.     Yes Historical Provider, MD  potassium chloride (KLOR-CON) 20 MEQ packet Take 20 mEq by mouth daily.    Yes Historical Provider, MD  simvastatin (ZOCOR) 20 MG tablet Take 20 mg by mouth at bedtime.  Yes Historical Provider, MD  valsartan-hydrochlorothiazide (DIOVAN-HCT) 160-12.5 MG per tablet Take 1 tablet by mouth every morning.     Yes Historical Provider, MD  phenazopyridine (PYRIDIUM) 200 MG tablet Take 1 tablet (200 mg total) by mouth 3 (three) times daily as needed for pain. Patient not taking: Reported on 04/25/2015 08/24/13   Irine Seal, MD   BP 135/71 mmHg  Pulse 89  Temp(Src) 98 F (36.7 C) (Oral)  Resp 19  SpO2 98% Physical Exam  Constitutional: She is oriented to person, place, and time. She appears  well-developed and well-nourished.  HENT:  Head: Normocephalic and atraumatic.  Eyes: Conjunctivae and EOM are normal. Pupils are equal, round, and reactive to light.  Neck: Normal range of motion. Neck supple.  Cardiovascular: Normal rate and regular rhythm.  Exam reveals no gallop and no friction rub.   No murmur heard. Pulmonary/Chest: Effort normal and breath sounds normal. No respiratory distress. She has no wheezes. She has no rales. She exhibits no tenderness.  Abdominal: Soft. Bowel sounds are normal. She exhibits no distension and no mass. There is no tenderness. There is no rebound and no guarding.  Musculoskeletal: Normal range of motion. She exhibits no edema or tenderness.  Neurological: She is alert and oriented to person, place, and time.  Skin: Skin is warm and dry.  Psychiatric: She has a normal mood and affect. Her behavior is normal. Judgment and thought content normal.  Nursing note and vitals reviewed.   ED Course  Procedures (including critical care time) Results for orders placed or performed during the hospital encounter of 04/25/15  Urinalysis, Routine w reflex microscopic (not at [facility])  Result Value Ref Range   Color, Urine YELLOW YELLOW   APPearance CLEAR CLEAR   Specific Gravity, Urine 1.014 1.005 - 1.030   pH 7.0 5.0 - 8.0   Glucose, UA NEGATIVE NEGATIVE mg/dL   Hgb urine dipstick MODERATE (A) NEGATIVE   Bilirubin Urine NEGATIVE NEGATIVE   Ketones, ur NEGATIVE NEGATIVE mg/dL   Protein, ur NEGATIVE NEGATIVE mg/dL   Urobilinogen, UA 1.0 0.0 - 1.0 mg/dL   Nitrite NEGATIVE NEGATIVE   Leukocytes, UA NEGATIVE NEGATIVE  CBC with Differential/Platelet  Result Value Ref Range   WBC 10.7 (H) 4.0 - 10.5 K/uL   RBC 4.62 3.87 - 5.11 MIL/uL   Hemoglobin 14.4 12.0 - 15.0 g/dL   HCT 42.1 36.0 - 46.0 %   MCV 91.1 78.0 - 100.0 fL   MCH 31.2 26.0 - 34.0 pg   MCHC 34.2 30.0 - 36.0 g/dL   RDW 13.2 11.5 - 15.5 %   Platelets 203 150 - 400 K/uL   Neutrophils Relative  % 75 43 - 77 %   Neutro Abs 8.0 (H) 1.7 - 7.7 K/uL   Lymphocytes Relative 17 12 - 46 %   Lymphs Abs 1.8 0.7 - 4.0 K/uL   Monocytes Relative 5 3 - 12 %   Monocytes Absolute 0.6 0.1 - 1.0 K/uL   Eosinophils Relative 2 0 - 5 %   Eosinophils Absolute 0.2 0.0 - 0.7 K/uL   Basophils Relative 1 0 - 1 %   Basophils Absolute 0.1 0.0 - 0.1 K/uL  Basic metabolic panel  Result Value Ref Range   Sodium 138 135 - 145 mmol/L   Potassium 4.0 3.5 - 5.1 mmol/L   Chloride 102 101 - 111 mmol/L   CO2 25 22 - 32 mmol/L   Glucose, Bld 140 (H) 65 - 99 mg/dL   BUN 23 (  H) 6 - 20 mg/dL   Creatinine, Ser 0.98 0.44 - 1.00 mg/dL   Calcium 9.8 8.9 - 10.3 mg/dL   GFR calc non Af Amer 51 (L) >60 mL/min   GFR calc Af Amer 59 (L) >60 mL/min   Anion gap 11 5 - 15  Urine microscopic-add on  Result Value Ref Range   Squamous Epithelial / LPF RARE RARE   RBC / HPF 21-50 <3 RBC/hpf   Urine-Other AMORPHOUS URATES/PHOSPHATES    No results found.    MDM   Final diagnoses:  Urinary retention    Patient with urinary retention. Postvoid residual volume is greater than 400.  Patient seen by and discussed with Dr. Tomi Bamberger, who recommends Foley catheter.  Labs are pending.  Anticipate discharge to home with urology follow-up tomorrow.  Foley placed with some difficulty.  F/u tomorrow with Dr. Jeffie Pollock.    Montine Circle, PA-C 04/25/15 2216  Dorie Rank, MD 04/26/15 [id number]

## 2015-04-25 NOTE — Discharge Instructions (Signed)
Acute Urinary Retention °Acute urinary retention is the temporary inability to urinate. This is an uncommon problem in women. It can be caused by: °· Infection. °· A side effect of a medicine. °· A problem in a nearby organ that presses or squeezes on the bladder or the urethra (the tube that drains the bladder). °· Psychological problems. °·  Surgery on your bladder, urethra, or pelvic organs that causes obstruction to the outflow of urine from your bladder. °HOME CARE INSTRUCTIONS  °If you are sent home with a Foley catheter and a drainage system, you will need to discuss the best course of action with your health care provider. While the catheter is in, maintain a good intake of fluids. Keep the drainage bag emptied and lower than your catheter. This is so that contaminated urine will not flow back into your bladder, which could lead to a urinary tract infection. °There are two main types of drainage bags. One is a large bag that usually is used at night. It has a good capacity that will allow you to sleep through the night without having to empty it. The second type is called a leg bag. It has a smaller capacity so it needs to be emptied more frequently. However, the main advantage is that it can be attached by a leg strap and goes underneath your clothing, allowing you the freedom to move about or leave your home. °Only take over-the-counter or prescription medicines for pain, discomfort, or fever as directed by your health care provider.  °SEEK MEDICAL CARE IF: °· You develop a low-grade fever. °· You experience spasms or leakage of urine with the spasms. °SEEK IMMEDIATE MEDICAL CARE IF:  °· You develop chills or fever. °· Your catheter stops draining urine. °· Your catheter falls out. °· You start to develop increased bleeding that does not respond to rest and increased fluid intake. °MAKE SURE YOU: °· Understand these instructions. °· Will watch your condition. °· Will get help right away if you are not  doing well or get worse. °Document Released: 08/02/2006 Document Revised: 05/24/2013 Document Reviewed: 01/12/2013 °ExitCare® Patient Information ©2015 [employer]. This information is not intended to replace advice given to you by your health care provider. Make sure you discuss any questions you have with your health care provider. ° °

## 2015-04-25 NOTE — ED Notes (Signed)
Labs delayed, pt not in room

## 2015-04-26 DIAGNOSIS — N359 Urethral stricture, unspecified: Secondary | ICD-10-CM | POA: Diagnosis not present

## 2015-04-26 DIAGNOSIS — R339 Retention of urine, unspecified: Secondary | ICD-10-CM | POA: Diagnosis not present

## 2015-04-29 ENCOUNTER — Other Ambulatory Visit: Payer: Self-pay | Admitting: Urology

## 2015-04-29 ENCOUNTER — Encounter (HOSPITAL_BASED_OUTPATIENT_CLINIC_OR_DEPARTMENT_OTHER): Payer: Self-pay | Admitting: *Deleted

## 2015-04-29 NOTE — Progress Notes (Signed)
NPO AFTER MN.  ARRIVE AT 0800. NEEDS EKG. CURRENT LAB RESULTS IN CHART AND EPIC.

## 2015-05-01 NOTE — H&P (Signed)
Reason For Visit Seen today for an ER f/u.   Active Problems Problems  1. Incomplete bladder emptying (R33.9)   Assessed By: Jimmey Ralph (Urology); Last Assessed: 26 Apr 2015 2. Urethral stricture (N35.9)   Assessed By: Jimmey Ralph (Urology); Last Assessed: 26 Apr 2015  History of Present Illness 81 YO female patient of Dr. Ralene Muskrat seen today for an ER f/u. Seen in the ER last pm for difficulty voiding. Per pt ER staff had difficulty placing foley but it was eventually placed. Bladder drained of 400 ml. She is requesting it be removed today. No hematuria but does have some bleeding from meatus.     Has had several days of worsening bladder emptying and spraying. Hx of urethral stricture with last cysto/dilatation Jan 2015.   Past Medical History Problems  1. History of Arthritis 2. History of hypercholesterolemia (Z86.39) 3. History of hypertension (Z86.79) 4. History of Pyuria (N39.0) 5. History of Straining on urination (R39.16) 6. History of Urinary retention (R33.9)  Surgical History Problems  1. History of Cystoscopy For Urethral Stricture 2. History of Cystoscopy For Urethral Stricture 3. History of Cystoscopy For Urethral Stricture 4. History of Dilation Of Female Urethra  Current Meds 1. Aspirin 81 MG TABS; Take 1 tablet daily;  Therapy: (Recorded:23Nov2011) to Recorded 2. Calcium 600 + D TABS;  Therapy: (Recorded:23Nov2011) to Recorded 3. Diovan HCT 160-12.5 MG Oral Tablet;  Therapy: [id number] to Recorded 4. Estrace 0.1 MG/GM Vaginal Cream; APPLY/RUB 1/2 INCH TO VAGINAL OPENING &  URETHRAL AREA;  Therapy: [id number] to (Last Rx:06Jul2015)  Requested for: [id number] Ordered 5. Fish Oil 1000 MG Oral Capsule;  Therapy: (Recorded:23Nov2011) to Recorded 6. Glucosamine CAPS;  Therapy: (Recorded:15Apr2013) to Recorded 7. Multiple Vitamin TABS;  Therapy: (Recorded:23Nov2011) to Recorded 8. Simvastatin 20 MG Oral Tablet;  Therapy: [id number] to Recorded 9.  Tylenol TABS;  Therapy: (Recorded:23Nov2011) to Recorded 10. Vitamin D 1000 UNIT Oral Tablet; Take 1 tablet daily;   Therapy: (Recorded:23Nov2011) to Recorded 11. Voltaren 1 % Transdermal Gel;   Therapy: [id number] to Recorded  Allergies Medication  1. Detrol TABS 2. Ditropan XL TBCR  Family History Problems  1. Family history of Death In The Family Father : Mother   Age 23 2. Family history of Death In The Family Mother : Mother   Age 41 CVA 50. Family history of Stroke Syndrome : Mother  Social History Problems  1. Denied: History of Alcohol Use 2. Caffeine Use   2-3/day 3. Former smoker [phone])   2-3 cigarettes socially. None since 1965 4. Marital History - Single  Review of Systems Genitourinary, constitutional, skin, eye, otolaryngeal, hematologic/lymphatic, cardiovascular, pulmonary, endocrine, musculoskeletal, gastrointestinal, neurological and psychiatric system(s) were reviewed and pertinent findings if present are noted and are otherwise negative.  Genitourinary: urinary hesitancy, incomplete emptying of bladder and initiating urination requires straining.    Vitals Vital Signs [Data Includes: Last 1 Day]  Recorded: 09Sep2016 08:35AM  Weight: 190 lb  BMI Calculated: 34.75 BSA Calculated: 1.87 Blood Pressure: 128 / 78 Temperature: 97.7 F Heart Rate: 56  Physical Exam Constitutional: Well nourished and well developed . No acute distress. The patient appears well hydrated.  Abdomen: The abdomen is obese. The abdomen is soft and nontender. No suprapubic tenderness.  Genitourinary:  The urethra is is stenotic and urethral caruncle. An indwelling catheter is present. Vaginal exam demonstrates atrophy and the vaginal epithelium to be poorly estrogenized.  Skin: Normal skin turgor.  Neuro/Psych:. Mood and affect are appropriate.    Results/Data Urine Belva Bertin Includes:  Last 1 Day]   [id number]  COLOR YELLOW   APPEARANCE CLEAR   SPECIFIC GRAVITY 1.015   pH  7.0   GLUCOSE NEGATIVE   BILIRUBIN NEGATIVE   KETONE NEGATIVE   BLOOD 3+   PROTEIN NEGATIVE   NITRITE NEGATIVE   LEUKOCYTE ESTERASE TRACE   SQUAMOUS EPITHELIAL/HPF 0-5 HPF  WBC 0-5 WBC/HPF  RBC 10-20 RBC/HPF  BACTERIA NONE SEEN HPF  CRYSTALS NONE SEEN HPF  CASTS NONE SEEN LPF  Yeast NONE SEEN HPF   Old records or history reviewed: Reviewed ER notes.  The following clinical lab reports were reviewed:  UA: significant microscopic hematuria.    Procedure  Procedure: dilation of urethral stricture.  Indication: urethral stricture and voiding dysfunction.  The patient reports a past history of stricture disease and urinary retention.  Risks were discussed with the patient. We discussed possible complications, including infection. The site was prepped with Betadine. 2% Lidocaine Jelly was used to anesthesize the urethra. A mild urethral stricture was located at the bladder neck. Female sounds were used for stricture dilation progressing from 27 [place].  Additional Details: post procedure bleeding noted. There were no complications.  Patient Status:. The patient tolerated the procedure well.    Assessment Assessed  1. Urethral stricture (N35.9) 2. Incomplete bladder emptying (R33.9)  Plan Health Maintenance  1. UA With REFLEX; [Do Not Release]; Status:Resulted - Requires Verification;   Done:  [id number] 08:59AM Urethral stricture  2. Follow-up MD Office  Follow-up to discuss cysto/urethral dilatation as OP  Status: Hold  For - Appointment,Date of Service  Requested for: 09Sep2016 3. Dilation of Female Urethra; Status:Complete;   Done: [id number] Urge incontinence of urine  4. Cath for Specimen [facility]); Status:Canceled - Appointment,Date of Service;   I was able to dilate her with only 1 sound due to discomfort but was able to insert #24 F sound. Had mild post dilatation bleeding. Hopefully this will allow her to void more easily till she can f/u w/Dr. Jeffie Pollock to scheduled elective  cysto/dilatation   Signatures Electronically signed by : Jimmey Ralph, Trey Paula; Apr 26 2015  9:20AM EST

## 2015-05-02 ENCOUNTER — Encounter (HOSPITAL_BASED_OUTPATIENT_CLINIC_OR_DEPARTMENT_OTHER): Payer: Self-pay | Admitting: Anesthesiology

## 2015-05-02 ENCOUNTER — Ambulatory Visit (HOSPITAL_BASED_OUTPATIENT_CLINIC_OR_DEPARTMENT_OTHER): Payer: Medicare Other | Admitting: Anesthesiology

## 2015-05-02 ENCOUNTER — Encounter (HOSPITAL_BASED_OUTPATIENT_CLINIC_OR_DEPARTMENT_OTHER)
Admission: RE | Disposition: A | Discharge status: Home / Self Care | Payer: Self-pay | Source: Ambulatory Visit | Attending: Urology

## 2015-05-02 ENCOUNTER — Ambulatory Visit (HOSPITAL_BASED_OUTPATIENT_CLINIC_OR_DEPARTMENT_OTHER)
Admission: RE | Admit: 2015-05-02 | Discharge: 2015-05-02 | Disposition: A | Discharge status: Home / Self Care | Payer: Medicare Other | Source: Ambulatory Visit | Attending: Urology | Admitting: Urology

## 2015-05-02 ENCOUNTER — Other Ambulatory Visit: Payer: Self-pay

## 2015-05-02 DIAGNOSIS — E780 Pure hypercholesterolemia: Secondary | ICD-10-CM | POA: Insufficient documentation

## 2015-05-02 DIAGNOSIS — N135 Crossing vessel and stricture of ureter without hydronephrosis: Secondary | ICD-10-CM | POA: Diagnosis not present

## 2015-05-02 DIAGNOSIS — Z79890 Hormone replacement therapy (postmenopausal): Secondary | ICD-10-CM | POA: Diagnosis not present

## 2015-05-02 DIAGNOSIS — Z7982 Long term (current) use of aspirin: Secondary | ICD-10-CM | POA: Insufficient documentation

## 2015-05-02 DIAGNOSIS — N359 Urethral stricture, unspecified: Secondary | ICD-10-CM | POA: Diagnosis not present

## 2015-05-02 DIAGNOSIS — Z79899 Other long term (current) drug therapy: Secondary | ICD-10-CM | POA: Insufficient documentation

## 2015-05-02 DIAGNOSIS — Z87891 Personal history of nicotine dependence: Secondary | ICD-10-CM | POA: Insufficient documentation

## 2015-05-02 DIAGNOSIS — Z791 Long term (current) use of non-steroidal anti-inflammatories (NSAID): Secondary | ICD-10-CM | POA: Insufficient documentation

## 2015-05-02 DIAGNOSIS — I10 Essential (primary) hypertension: Secondary | ICD-10-CM | POA: Insufficient documentation

## 2015-05-02 DIAGNOSIS — R339 Retention of urine, unspecified: Secondary | ICD-10-CM | POA: Diagnosis present

## 2015-05-02 HISTORY — DX: Retention of urine, unspecified: R33.9

## 2015-05-02 HISTORY — DX: Unspecified urethral stricture, male, unspecified site: N35.919

## 2015-05-02 HISTORY — PX: CYSTOSCOPY WITH URETHRAL DILATATION: SHX5125

## 2015-05-02 SURGERY — CYSTOSCOPY, WITH URETHRAL DILATION
Anesthesia: Monitor Anesthesia Care | Site: Bladder

## 2015-05-02 MED ORDER — PROPOFOL 10 MG/ML IV BOLUS
INTRAVENOUS | Status: DC | PRN
  Administered 2015-05-02 (×3): 20 mg via INTRAVENOUS

## 2015-05-02 MED ORDER — MEPERIDINE HCL 25 MG/ML IJ SOLN
6.2500 mg | INTRAMUSCULAR | Status: DC | PRN
  Filled 2015-05-02: qty 1

## 2015-05-02 MED ORDER — PHENAZOPYRIDINE HCL 200 MG PO TABS: 200.0000 mg | ORAL_TABLET | Freq: Three times a day (TID) | ORAL | Status: DC | PRN

## 2015-05-02 MED ORDER — SODIUM CHLORIDE 0.9 % IV SOLN
250.0000 mL | INTRAVENOUS | Status: DC | PRN
  Filled 2015-05-02: qty 250

## 2015-05-02 MED ORDER — PROPOFOL 500 MG/50ML IV EMUL
INTRAVENOUS | Status: DC | PRN
  Administered 2015-05-02: 100 ug/kg/min via INTRAVENOUS

## 2015-05-02 MED ORDER — SODIUM CHLORIDE 0.9 % IJ SOLN
3.0000 mL | INTRAMUSCULAR | Status: DC | PRN
  Filled 2015-05-02: qty 3

## 2015-05-02 MED ORDER — LIDOCAINE HCL 2 % EX GEL
CUTANEOUS | Status: DC | PRN
  Administered 2015-05-02: 1 via TOPICAL

## 2015-05-02 MED ORDER — ACETAMINOPHEN 650 MG RE SUPP
650.0000 mg | RECTAL | Status: DC | PRN
  Filled 2015-05-02: qty 1

## 2015-05-02 MED ORDER — LIDOCAINE HCL (CARDIAC) 20 MG/ML IV SOLN
INTRAVENOUS | Status: DC | PRN
  Administered 2015-05-02: 50 mg via INTRAVENOUS

## 2015-05-02 MED ORDER — FENTANYL CITRATE (PF) 100 MCG/2ML IJ SOLN
INTRAMUSCULAR | Status: DC | PRN
  Administered 2015-05-02 (×4): 25 ug via INTRAVENOUS

## 2015-05-02 MED ORDER — FENTANYL CITRATE (PF) 100 MCG/2ML IJ SOLN
25.0000 ug | INTRAMUSCULAR | Status: DC | PRN
  Filled 2015-05-02: qty 1

## 2015-05-02 MED ORDER — CEFAZOLIN SODIUM 1-5 GM-% IV SOLN
1.0000 g | INTRAVENOUS | Status: DC
  Filled 2015-05-02: qty 50

## 2015-05-02 MED ORDER — ACETAMINOPHEN 325 MG PO TABS
650.0000 mg | ORAL_TABLET | ORAL | Status: DC | PRN
  Filled 2015-05-02: qty 2

## 2015-05-02 MED ORDER — SODIUM CHLORIDE 0.9 % IJ SOLN
3.0000 mL | Freq: Two times a day (BID) | INTRAMUSCULAR | Status: DC
  Filled 2015-05-02: qty 3

## 2015-05-02 MED ORDER — PROMETHAZINE HCL 25 MG/ML IJ SOLN
6.2500 mg | INTRAMUSCULAR | Status: DC | PRN
  Filled 2015-05-02: qty 1

## 2015-05-02 MED ORDER — CEFAZOLIN SODIUM-DEXTROSE 2-3 GM-% IV SOLR
INTRAVENOUS | Status: AC
  Filled 2015-05-02: qty 50

## 2015-05-02 MED ORDER — STERILE WATER FOR IRRIGATION IR SOLN
Status: DC | PRN
  Administered 2015-05-02: 3000 mL via INTRAVESICAL

## 2015-05-02 MED ORDER — ONDANSETRON HCL 4 MG/2ML IJ SOLN
INTRAMUSCULAR | Status: DC | PRN
  Administered 2015-05-02: 4 mg via INTRAVENOUS

## 2015-05-02 MED ORDER — OXYCODONE HCL 5 MG PO TABS
5.0000 mg | ORAL_TABLET | ORAL | Status: DC | PRN
  Filled 2015-05-02: qty 2

## 2015-05-02 MED ORDER — FENTANYL CITRATE (PF) 100 MCG/2ML IJ SOLN
INTRAMUSCULAR | Status: AC
  Filled 2015-05-02: qty 4

## 2015-05-02 MED ORDER — LACTATED RINGERS IV SOLN
INTRAVENOUS | Status: DC
  Administered 2015-05-02: 09:00:00 via INTRAVENOUS
  Filled 2015-05-02: qty 1000

## 2015-05-02 MED ORDER — CEFAZOLIN SODIUM-DEXTROSE 2-3 GM-% IV SOLR
2.0000 g | INTRAVENOUS | Status: AC
  Administered 2015-05-02: 2 g via INTRAVENOUS
  Filled 2015-05-02: qty 50

## 2015-05-02 MED ORDER — FENTANYL CITRATE (PF) 100 MCG/2ML IJ SOLN
25.0000 ug | INTRAMUSCULAR | Status: DC | PRN
Start: 2015-05-02 — End: 2015-05-02
  Filled 2015-05-02: qty 1

## 2015-05-02 SURGICAL SUPPLY — 12 items
BAG DRAIN URO-CYSTO SKYTR STRL (DRAIN) ×3 IMPLANT
BAG DRN UROCATH (DRAIN) ×1
CLOTH BEACON ORANGE TIMEOUT ST (SAFETY) ×3 IMPLANT
ELECT REM PT RETURN 9FT ADLT (ELECTROSURGICAL) ×3
ELECTRODE REM PT RTRN 9FT ADLT (ELECTROSURGICAL) ×1 IMPLANT
GLOVE SURG SS PI 7.5 STRL IVOR (GLOVE) ×4 IMPLANT
GLOVE SURG SS PI 8.0 STRL IVOR (GLOVE) ×3 IMPLANT
GOWN STRL REUS W/TWL LRG LVL3 (GOWN DISPOSABLE) ×3 IMPLANT
GOWN STRL REUS W/TWL XL LVL3 (GOWN DISPOSABLE) ×3 IMPLANT
MANIFOLD NEPTUNE II (INSTRUMENTS) ×3 IMPLANT
PACK CYSTO (CUSTOM PROCEDURE TRAY) ×3 IMPLANT
WATER STERILE IRR 3000ML UROMA (IV SOLUTION) ×3 IMPLANT

## 2015-05-02 NOTE — Brief Op Note (Signed)
05/02/2015  9:53 AM  PATIENT:  Tara Bennett  81 y.o. female  PRE-OPERATIVE DIAGNOSIS:  URETERAL STRICTURE  POST-OPERATIVE DIAGNOSIS:  URETERAL STRICTURE  PROCEDURE:  Procedure(s): CYSTOSCOPY WITH URETHRAL DILATATION (N/A) Fulguration of urethra  SURGEON:  Surgeon(s) and Role:    * Irine Seal, MD - Primary  PHYSICIAN ASSISTANT:   ASSISTANTS: none   ANESTHESIA:   general  EBL:  Total I/O In: 500 [I.V.:500] Out: -   BLOOD ADMINISTERED:none  DRAINS: none   LOCAL MEDICATIONS USED:  55ml of 2% lidocaine jelly  SPECIMEN:  No Specimen  DISPOSITION OF SPECIMEN:  N/A  COUNTS:  YES  TOURNIQUET:  * No tourniquets in log *  DICTATION: .Other Dictation: Dictation Number [id number]  PLAN OF CARE: Discharge to home after PACU  PATIENT DISPOSITION:  PACU - hemodynamically stable.   Delay start of Pharmacological VTE agent (>24hrs) due to surgical blood loss or risk of bleeding: not applicable

## 2015-05-02 NOTE — Anesthesia Procedure Notes (Signed)
Procedure Name: MAC Date/Time: 05/02/2015 9:35 AM Performed by: Mechele Claude Pre-anesthesia Checklist: Patient identified, Timeout performed, Emergency Drugs available, Suction available and Patient being monitored Patient Re-evaluated:Patient Re-evaluated prior to inductionOxygen Delivery Method: Simple face mask Placement Confirmation: positive ETCO2

## 2015-05-02 NOTE — Discharge Instructions (Addendum)

## 2015-05-02 NOTE — Interval H&P Note (Signed)
History and Physical Interval Note:  05/02/2015 9:25 AM  Tara Bennett  has presented today for surgery, with the diagnosis of URETERAL STRICTURE  The various methods of treatment have been discussed with the patient and family. After consideration of risks, benefits and other options for treatment, the patient has consented to  Procedure(s): CYSTOSCOPY WITH URETHRAL DILATATION (N/A) as a surgical intervention .  The patient's history has been reviewed, patient examined, no change in status, stable for surgery.  I have reviewed the patient's chart and labs.  Questions were answered to the patient's satisfaction.     Dim Meisinger J

## 2015-05-02 NOTE — Anesthesia Preprocedure Evaluation (Signed)
Anesthesia Evaluation  Patient identified by MRN, date of birth, ID band Patient awake    Reviewed: Allergy & Precautions, H&P , NPO status , Patient's Chart, lab work & pertinent test results  Airway Mallampati: II  TM Distance: >3 FB Neck ROM: Full    Dental no notable dental hx.    Pulmonary former smoker,    Pulmonary exam normal breath sounds clear to auscultation       Cardiovascular Exercise Tolerance: Good hypertension, Pt. on medications negative cardio ROS Normal cardiovascular exam Rhythm:Regular Rate:Normal     Neuro/Psych negative neurological ROS  negative psych ROS   GI/Hepatic negative GI ROS, Neg liver ROS,   Endo/Other  negative endocrine ROS  Renal/GU negative Renal ROS  negative genitourinary   Musculoskeletal negative musculoskeletal ROS (+)   Abdominal   Peds negative pediatric ROS (+)  Hematology negative hematology ROS (+)   Anesthesia Other Findings   Reproductive/Obstetrics negative OB ROS                             Anesthesia Physical  Anesthesia Plan  ASA: II  Anesthesia Plan: MAC   Post-op Pain Management:    Induction: Intravenous  Airway Management Planned: Simple Face Mask  Additional Equipment:   Intra-op Plan:   Post-operative Plan:   Informed Consent: I have reviewed the patients History and Physical, chart, labs and discussed the procedure including the risks, benefits and alternatives for the proposed anesthesia with the patient or authorized representative who has indicated his/her understanding and acceptance.   Dental advisory given  Plan Discussed with: CRNA  Anesthesia Plan Comments:         Anesthesia Quick Evaluation

## 2015-05-02 NOTE — Transfer of Care (Addendum)
Last Vitals:  Filed Vitals:   05/02/15 0809  BP: 147/53  Pulse: 67  Temp: 37.1 C  Resp: 16    Immediate Anesthesia Transfer of Care Note  Patient: Tara Bennett  Procedure(s) Performed: Procedure(s) (LRB): CYSTOSCOPY WITH URETHRAL DILATATION (N/A)  Patient Location: PACU  Anesthesia Type:MAC  Level of Consciousness: awake, alert  and oriented  Airway & Oxygen Therapy: Patient Spontanous Breathing and Patient connected to face mask oxygen  Post-op Assessment: Report given to PACU RN and Post -op Vital signs reviewed and stable  Post vital signs: Reviewed and stable  Complications: No apparent anesthesia complications

## 2015-05-02 NOTE — Anesthesia Postprocedure Evaluation (Signed)
  Anesthesia Post-op Note  Patient: Tara Bennett  Procedure(s) Performed: Procedure(s) (LRB): CYSTOSCOPY WITH URETHRAL DILATATION, FULGURATION BLADDER NECK (N/A)  Patient Location: PACU  Anesthesia Type: MAC  Level of Consciousness: awake and alert   Airway and Oxygen Therapy: Patient Spontanous Breathing  Post-op Pain: mild  Post-op Assessment: Post-op Vital signs reviewed, Patient's Cardiovascular Status Stable, Respiratory Function Stable, Patent Airway and No signs of Nausea or vomiting  Last Vitals:  Filed Vitals:   05/02/15 1027  BP: 135/71  Pulse: 57  Temp:   Resp: 16    Post-op Vital Signs: stable   Complications: No apparent anesthesia complications

## 2015-05-03 ENCOUNTER — Encounter (HOSPITAL_BASED_OUTPATIENT_CLINIC_OR_DEPARTMENT_OTHER): Payer: Self-pay | Admitting: Urology

## 2015-05-03 NOTE — Op Note (Signed)
NAMEMarland Kitchen  Tara Bennett, Tara Bennett NO.:  [id number]  MEDICAL RECORD NO.:  [id number]  LOCATION:                               FACILITY:  [facility]  PHYSICIAN:  Marshall Cork. Jeffie Pollock, M.D.    DATE OF BIRTH:  06-24-1928  DATE OF PROCEDURE:  05/02/2015 DATE OF DISCHARGE:  05/02/2015                              OPERATIVE REPORT   PROCEDURE: 1. Cystoscopy with urethral dilation. 2. Incision of urethral band with fulguration.  PREOPERATIVE DIAGNOSIS:  Urethral stricture disease with urinary retention.  POSTOPERATIVE DIAGNOSIS:  Urethral stricture disease with urinary retention with a mid urethral band.  SURGEON:  Marshall Cork. Jeffie Pollock, M.D.  ANESTHESIA:  General.  BLOOD LOSS:  None.  DRAINS:  None.  COMPLICATIONS:  None.  INDICATIONS:  [name] is an 81 year old white female with a history of urethral stricture disease who had been seen recently in office with progressive voiding difficulty.  She was not comfortable with an office dilation, so it was felt that a dilation under anesthesia was indicated. In the interim from my office visit, she went into retention and was seen the emergency room where a small Foley catheter was placed.  She was then seen back in the office where urethral dilation to 24-French was attempted, but was difficult due to discomfort and then she went back in retention and had Foley replaced.  FINDINGS OF PROCEDURE:  She was given Cipro.  She was taken to the operating room where general anesthetic was induced.  She was placed in lithotomy position.  Her perineum and genitalia were prepped with Betadine solution, and she was draped in usual sterile fashion.  Female sounds were then passed from 26 up to 34-French without significant difficulty.  After the dilation, cystoscopy was performed.  This demonstrated a small band between the anterior and posterior urethra in the mid urethral level and there was a channel on either side suggestive of a false passage  during her earlier dilation.  Examination of bladder revealed moderate severe trabeculation with several cellules and small diverticula.  There was considerable erythema of the bladder wall consistent with recovery from over distention and catheter drainage.  The ureteral orifices were unremarkable.  After initial cystoscopy, a Bugbee electrode was used to fulgurate the band in the mid urethra.  I then used endoscopic scissors to divide the band.  Additional fulguration was performed to provide hemostasis.  At this point, the bladder was partially drained.  She was taken down from lithotomy position.  Her anesthetic was reversed.  She was moved to the recovery room in stable condition.  She will be kept until she is able to urinate. If she is not able to urinate, she will go home with a catheter for a few more days.     Marshall Cork. Jeffie Pollock, M.D.     JJW/MEDQ  D:  05/02/2015  T:  05/02/2015  Job:  [id number]

## 2015-05-16 DIAGNOSIS — Z23 Encounter for immunization: Secondary | ICD-10-CM | POA: Diagnosis not present

## 2015-05-21 DIAGNOSIS — N359 Urethral stricture, unspecified: Secondary | ICD-10-CM | POA: Diagnosis not present

## 2015-05-21 DIAGNOSIS — R339 Retention of urine, unspecified: Secondary | ICD-10-CM | POA: Diagnosis not present

## 2015-06-13 DIAGNOSIS — N359 Urethral stricture, unspecified: Secondary | ICD-10-CM | POA: Diagnosis not present

## 2015-06-13 DIAGNOSIS — E785 Hyperlipidemia, unspecified: Secondary | ICD-10-CM | POA: Diagnosis not present

## 2015-06-13 DIAGNOSIS — J302 Other seasonal allergic rhinitis: Secondary | ICD-10-CM | POA: Diagnosis not present

## 2015-06-13 DIAGNOSIS — I10 Essential (primary) hypertension: Secondary | ICD-10-CM | POA: Diagnosis not present

## 2015-06-13 DIAGNOSIS — M1990 Unspecified osteoarthritis, unspecified site: Secondary | ICD-10-CM | POA: Diagnosis not present

## 2015-06-13 DIAGNOSIS — I872 Venous insufficiency (chronic) (peripheral): Secondary | ICD-10-CM | POA: Diagnosis not present

## 2015-06-13 DIAGNOSIS — Z6831 Body mass index (BMI) 31.0-31.9, adult: Secondary | ICD-10-CM | POA: Diagnosis not present

## 2015-06-13 DIAGNOSIS — M810 Age-related osteoporosis without current pathological fracture: Secondary | ICD-10-CM | POA: Diagnosis not present

## 2015-07-15 ENCOUNTER — Encounter (HOSPITAL_COMMUNITY): Payer: Self-pay | Admitting: Emergency Medicine

## 2015-07-15 ENCOUNTER — Emergency Department (HOSPITAL_COMMUNITY)
Admission: EM | Admit: 2015-07-15 | Discharge: 2015-07-15 | Disposition: A | Discharge status: Home / Self Care | Payer: Medicare Other | Attending: Emergency Medicine | Admitting: Emergency Medicine

## 2015-07-15 ENCOUNTER — Emergency Department (HOSPITAL_COMMUNITY): Payer: Medicare Other

## 2015-07-15 DIAGNOSIS — Z974 Presence of external hearing-aid: Secondary | ICD-10-CM | POA: Insufficient documentation

## 2015-07-15 DIAGNOSIS — W01198A Fall on same level from slipping, tripping and stumbling with subsequent striking against other object, initial encounter: Secondary | ICD-10-CM | POA: Insufficient documentation

## 2015-07-15 DIAGNOSIS — Z79899 Other long term (current) drug therapy: Secondary | ICD-10-CM | POA: Insufficient documentation

## 2015-07-15 DIAGNOSIS — Z23 Encounter for immunization: Secondary | ICD-10-CM | POA: Insufficient documentation

## 2015-07-15 DIAGNOSIS — S0231XA Fracture of orbital floor, right side, initial encounter for closed fracture: Secondary | ICD-10-CM | POA: Insufficient documentation

## 2015-07-15 DIAGNOSIS — Y9389 Activity, other specified: Secondary | ICD-10-CM | POA: Diagnosis not present

## 2015-07-15 DIAGNOSIS — S0993XA Unspecified injury of face, initial encounter: Secondary | ICD-10-CM | POA: Diagnosis present

## 2015-07-15 DIAGNOSIS — Z7982 Long term (current) use of aspirin: Secondary | ICD-10-CM | POA: Diagnosis not present

## 2015-07-15 DIAGNOSIS — Z87891 Personal history of nicotine dependence: Secondary | ICD-10-CM | POA: Insufficient documentation

## 2015-07-15 DIAGNOSIS — Z791 Long term (current) use of non-steroidal anti-inflammatories (NSAID): Secondary | ICD-10-CM | POA: Diagnosis not present

## 2015-07-15 DIAGNOSIS — Y998 Other external cause status: Secondary | ICD-10-CM | POA: Diagnosis not present

## 2015-07-15 DIAGNOSIS — S0990XA Unspecified injury of head, initial encounter: Secondary | ICD-10-CM | POA: Insufficient documentation

## 2015-07-15 DIAGNOSIS — M1990 Unspecified osteoarthritis, unspecified site: Secondary | ICD-10-CM | POA: Diagnosis not present

## 2015-07-15 DIAGNOSIS — S0230XA Fracture of orbital floor, unspecified side, initial encounter for closed fracture: Secondary | ICD-10-CM

## 2015-07-15 DIAGNOSIS — Y9289 Other specified places as the place of occurrence of the external cause: Secondary | ICD-10-CM | POA: Insufficient documentation

## 2015-07-15 DIAGNOSIS — S60311A Abrasion of right thumb, initial encounter: Secondary | ICD-10-CM | POA: Diagnosis not present

## 2015-07-15 DIAGNOSIS — I10 Essential (primary) hypertension: Secondary | ICD-10-CM | POA: Insufficient documentation

## 2015-07-15 DIAGNOSIS — Z87448 Personal history of other diseases of urinary system: Secondary | ICD-10-CM | POA: Diagnosis not present

## 2015-07-15 DIAGNOSIS — R51 Headache: Secondary | ICD-10-CM | POA: Diagnosis not present

## 2015-07-15 MED ORDER — AMOXICILLIN-POT CLAVULANATE 875-125 MG PO TABS: 1.0000 | ORAL_TABLET | Freq: Two times a day (BID) | ORAL | Status: DC

## 2015-07-15 MED ORDER — TETANUS-DIPHTH-ACELL PERTUSSIS 5-2.5-18.5 LF-MCG/0.5 IM SUSP
0.5000 mL | Freq: Once | INTRAMUSCULAR | Status: AC
  Administered 2015-07-15: 0.5 mL via INTRAMUSCULAR
  Filled 2015-07-15: qty 0.5

## 2015-07-15 MED ORDER — ACETAMINOPHEN 325 MG PO TABS
650.0000 mg | ORAL_TABLET | Freq: Once | ORAL | Status: AC
  Administered 2015-07-15: 650 mg via ORAL
  Filled 2015-07-15: qty 2

## 2015-07-15 NOTE — Discharge Instructions (Signed)
You have a fracture of one of your bones around her eye. If you develop any trouble moving URI, blurry vision, or pain of her eyeball, return to the ER immediately. Otherwise see the eye specialist tomorrow at 8 AM. You will be placed on antibiotics. DO NOT BLOW YOUR NOSE. Use decongestants. Return to the ER if any symptoms worsen or new symptoms develop.

## 2015-07-15 NOTE — ED Notes (Signed)
Patient transported to CT 

## 2015-07-15 NOTE — ED Notes (Signed)
Pt tripped over her cane and fell about an hour ago.  Pt states that she blew her nose then she felt a bunch of pressure in her head and then her right eye swelled up and pt states she is unable to open her right eye.  Pt also has an abrasion on her right hand, bleeding is controlled at this time.

## 2015-07-15 NOTE — ED Notes (Signed)
Pt did visual acuity screening with out her eye glasses and held open the swollen eye for exam. Rn and MD made aware

## 2015-07-15 NOTE — ED Provider Notes (Signed)
CSN: [id number]     Arrival date & time 07/15/15  1206 History   First MD Initiated Contact with Patient 07/15/15 1632     Chief Complaint  Patient presents with  . Fall  . Facial Swelling  . Abrasion    rigth hand     (Consider location/radiation/quality/duration/timing/severity/associated sxs/prior Treatment) HPI  81 year old female presents after a fall. She was helping a friend get to her car when she tripped over her cane and landed on the concrete. Patient did not lose consciousness. She takes a baby aspirin but no other blood thinners. Patient states later she blew her nose and felt a lot of pressure in her head and noticed new periorbital swelling. She has pain in this area. Patient had transient epistaxis out of her right nose but this is resolved. Patient rates her pain as moderate but does not want any pain medicine this time. Denies weakness or numbness. States her globe does not hurt but she cannot see because of the amount of swelling. No neck pain. Right thumb abrasion, no pain. Unsure of last tetanus immunization.  Past Medical History  Diagnosis Date  . History of urethral stricture     freq/ urge  . Hypertension   . Arthritis   . Wears hearing aid   . Urethral stricture   . Urinary retention   . Foley catheter in place    Past Surgical History  Procedure Laterality Date  . Cysto/ urethral dilation/ fulgeration of bleeder  10-16-10  . Cystoscopy with urethral dilatation  07/02/2011    Procedure: CYSTOSCOPY WITH URETHRAL DILATATION;  Surgeon: Malka So;  Location: [place];  Service: Urology;  Laterality: N/A;  . Cystoscopy with urethral dilatation N/A 08/24/2013    Procedure: CYSTOSCOPY WITH URETHRAL DILATATION;  Surgeon: Irine Seal, MD;  Location: [facility];  Service: Urology;  Laterality: N/A;  . Cystoscopy with urethral dilatation N/A 05/02/2015    Procedure: CYSTOSCOPY WITH URETHRAL DILATATION, FULGURATION BLADDER NECK;   Surgeon: Irine Seal, MD;  Location: [place];  Service: Urology;  Laterality: N/A;   No family history on file. Social History  Substance Use Topics  . Smoking status: Former Smoker    Quit date: 07/01/1964  . Smokeless tobacco: Never Used  . Alcohol Use: Yes     Comment: very rare   OB History    No data available     Review of Systems  HENT: Positive for facial swelling.   Eyes: Negative for pain and visual disturbance.  Gastrointestinal: Negative for vomiting.  Neurological: Negative for weakness, numbness and headaches.  All other systems reviewed and are negative.     Allergies  Furosemide  Home Medications   Prior to Admission medications   Medication Sig Start Date End Date Taking? Authorizing Provider  acetaminophen (TYLENOL) 325 MG tablet Take 650 mg by mouth every 6 (six) hours as needed for moderate pain.     Historical Provider, MD  aspirin 81 MG tablet Take 81 mg by mouth every evening.     Historical Provider, MD  calcium citrate-vitamin D 200-200 MG-UNIT TABS Take 1 tablet by mouth 3 (three) times daily.     Historical Provider, MD  diclofenac sodium (VOLTAREN) 1 % GEL Apply 2 g topically at bedtime.  01/26/15   Historical Provider, MD  fish oil-omega-3 fatty acids 1000 MG capsule Take 2 g by mouth daily.      Historical Provider, MD  Multiple Vitamins-Minerals (CENTRUM SILVER PO)  Take by mouth daily.      Historical Provider, MD  phenazopyridine (PYRIDIUM) 200 MG tablet Take 1 tablet (200 mg total) by mouth 3 (three) times daily as needed for pain. 05/02/15   Irine Seal, MD  sennosides-docusate sodium (SENOKOT-S) 8.6-50 MG tablet Take 1 tablet by mouth as needed for constipation.    Historical Provider, MD  simvastatin (ZOCOR) 20 MG tablet Take 20 mg by mouth at bedtime.      Historical Provider, MD  valsartan-hydrochlorothiazide (DIOVAN-HCT) 160-12.5 MG per tablet Take 1 tablet by mouth every morning.      Historical Provider, MD   BP 157/80  mmHg  Pulse 78  Temp(Src) 97.8 F (36.6 C) (Oral)  Resp 18  SpO2 98% Physical Exam  Constitutional: She is oriented to person, place, and time. She appears well-developed and well-nourished.  HENT:  Head: Normocephalic and atraumatic.    Right Ear: External ear normal.  Left Ear: External ear normal.  Nose: Nose normal.  Eyes: EOM are normal. Pupils are equal, round, and reactive to light. Right eye exhibits no discharge. Left eye exhibits no discharge.  Normal eyes bilaterally, no conjunctival injection/hemorrhage. Pupils normal and reactive.  Neck: Neck supple.  Cardiovascular: Normal rate, regular rhythm and normal heart sounds.   Pulmonary/Chest: Effort normal and breath sounds normal.  Abdominal: She exhibits no distension.  Musculoskeletal:       Hands: Neurological: She is alert and oriented to person, place, and time.  CN 2-12 grossly intact. 5/5 strength in all 4 extremities  Skin: Skin is warm and dry.  Nursing note and vitals reviewed.   ED Course  Procedures (including critical care time) Labs Review Labs Reviewed - No data to display  Imaging Review Ct Head Wo Contrast  07/15/2015  CLINICAL DATA:  81 year old female with acute headache and right facial pain following fall today. Initial encounter. EXAM: CT HEAD WITHOUT CONTRAST CT MAXILLOFACIAL WITHOUT CONTRAST TECHNIQUE: Multidetector CT imaging of the head and maxillofacial structures were performed using the standard protocol without intravenous contrast. Multiplanar CT image reconstructions of the maxillofacial structures were also generated. COMPARISON:  None. FINDINGS: CT HEAD FINDINGS Mild generalized cerebral and cerebellar volume loss and mild chronic small-vessel white matter ischemic changes are noted. No acute intracranial abnormalities are identified, including mass lesion or mass effect, hydrocephalus, extra-axial fluid collection, midline shift, hemorrhage, or acute infarction. CT MAXILLOFACIAL  FINDINGS A right orbital floor fracture is identified (3-5 mm inferior displacement) with some herniation of fat. The inferior orbital musculature lies along the fracture site. 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 The globes bilaterally retain their spherical shape. There is no evidence of intraconal abnormality. No other acute fracture, subluxation or dislocation identified. A small amount of fluid/blood within the right maxillary sinus is noted. No other significant abnormalities are identified with the paranasal sinuses, mastoid air cells or middle/inner ears. IMPRESSION: Right orbital floor fracture as described with right orbital and facial emphysema. No evidence of acute intracranial abnormality. Atrophy and chronic small-vessel white matter ischemic changes. Electronically Signed   By: Margarette Canada M.D.   On: 07/15/2015 17:52   Ct Maxillofacial Wo Cm  07/15/2015  CLINICAL DATA:  81 year old female with acute headache and right facial pain following fall today. Initial encounter. EXAM: CT HEAD WITHOUT CONTRAST CT MAXILLOFACIAL WITHOUT CONTRAST TECHNIQUE: Multidetector CT imaging of the head and maxillofacial structures were performed using the standard protocol without intravenous contrast. Multiplanar CT image reconstructions of the maxillofacial structures were also generated. COMPARISON:  None. FINDINGS: CT HEAD FINDINGS Mild generalized cerebral and cerebellar volume loss and mild chronic small-vessel white matter ischemic changes are noted. No acute intracranial abnormalities are identified, including mass lesion or mass effect, hydrocephalus, extra-axial fluid collection, midline shift, hemorrhage, or acute infarction. CT MAXILLOFACIAL FINDINGS A right orbital floor fracture is identified (3-5 mm inferior displacement) with some herniation of fat. The inferior orbital musculature lies along the fracture site. 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 The globes bilaterally retain  their spherical shape. There is no evidence of intraconal abnormality. No other acute fracture, subluxation or dislocation identified. A small amount of fluid/blood within the right maxillary sinus is noted. No other significant abnormalities are identified with the paranasal sinuses, mastoid air cells or middle/inner ears. IMPRESSION: Right orbital floor fracture as described with right orbital and facial emphysema. No evidence of acute intracranial abnormality. Atrophy and chronic small-vessel white matter ischemic changes. Electronically Signed   By: Margarette Canada M.D.   On: 07/15/2015 17:52   I have personally reviewed and evaluated these images and lab results as part of my medical decision-making.   EKG Interpretation None      MDM   Final diagnoses:  Closed fracture of orbital floor, initial encounter [facility])    Patient with a mechanical fall and subsequent orbital floor fracture. No signs of entrapment. Patient's globe appears normal and she has normal visual acuity for her. Discussed with ophthalmology on-call, Dr. Arlina Robes, who recommends antibiotics and will follow the patient up in the morning in her clinic. Advised the patient not to blow her nose, and patient repeatedly declines pain medicine stronger than Tylenol.    Sherwood Gambler, MD 07/15/15 2329

## 2015-07-16 DIAGNOSIS — H40003 Preglaucoma, unspecified, bilateral: Secondary | ICD-10-CM | POA: Diagnosis not present

## 2015-07-16 DIAGNOSIS — H25813 Combined forms of age-related cataract, bilateral: Secondary | ICD-10-CM | POA: Diagnosis not present

## 2015-07-16 DIAGNOSIS — S0230XA Fracture of orbital floor, unspecified side, initial encounter for closed fracture: Secondary | ICD-10-CM | POA: Diagnosis not present

## 2015-07-19 DIAGNOSIS — S0231XG Fracture of orbital floor, right side, subsequent encounter for fracture with delayed healing: Secondary | ICD-10-CM | POA: Diagnosis not present

## 2015-07-25 ENCOUNTER — Encounter (HOSPITAL_BASED_OUTPATIENT_CLINIC_OR_DEPARTMENT_OTHER): Payer: Self-pay | Admitting: *Deleted

## 2015-07-26 ENCOUNTER — Other Ambulatory Visit (HOSPITAL_COMMUNITY): Payer: Self-pay | Admitting: Otolaryngology

## 2015-07-29 ENCOUNTER — Encounter (HOSPITAL_BASED_OUTPATIENT_CLINIC_OR_DEPARTMENT_OTHER)
Admission: RE | Disposition: A | Discharge status: Home / Self Care | Payer: Self-pay | Source: Ambulatory Visit | Attending: Otolaryngology

## 2015-07-29 ENCOUNTER — Ambulatory Visit (HOSPITAL_BASED_OUTPATIENT_CLINIC_OR_DEPARTMENT_OTHER)
Admission: RE | Admit: 2015-07-29 | Discharge: 2015-07-30 | Disposition: A | Discharge status: Home / Self Care | Payer: Medicare Other | Source: Ambulatory Visit | Attending: Otolaryngology | Admitting: Otolaryngology

## 2015-07-29 ENCOUNTER — Ambulatory Visit (HOSPITAL_BASED_OUTPATIENT_CLINIC_OR_DEPARTMENT_OTHER): Payer: Medicare Other | Admitting: Anesthesiology

## 2015-07-29 ENCOUNTER — Encounter (HOSPITAL_BASED_OUTPATIENT_CLINIC_OR_DEPARTMENT_OTHER): Payer: Self-pay

## 2015-07-29 DIAGNOSIS — I10 Essential (primary) hypertension: Secondary | ICD-10-CM | POA: Insufficient documentation

## 2015-07-29 DIAGNOSIS — M1990 Unspecified osteoarthritis, unspecified site: Secondary | ICD-10-CM | POA: Insufficient documentation

## 2015-07-29 DIAGNOSIS — S0231XG Fracture of orbital floor, right side, subsequent encounter for fracture with delayed healing: Secondary | ICD-10-CM | POA: Diagnosis not present

## 2015-07-29 DIAGNOSIS — Z888 Allergy status to other drugs, medicaments and biological substances status: Secondary | ICD-10-CM | POA: Diagnosis not present

## 2015-07-29 DIAGNOSIS — Z79899 Other long term (current) drug therapy: Secondary | ICD-10-CM | POA: Diagnosis not present

## 2015-07-29 DIAGNOSIS — S0231XA Fracture of orbital floor, right side, initial encounter for closed fracture: Secondary | ICD-10-CM | POA: Diagnosis not present

## 2015-07-29 DIAGNOSIS — Z87891 Personal history of nicotine dependence: Secondary | ICD-10-CM | POA: Diagnosis not present

## 2015-07-29 DIAGNOSIS — Z7982 Long term (current) use of aspirin: Secondary | ICD-10-CM | POA: Diagnosis not present

## 2015-07-29 DIAGNOSIS — S0230XB Fracture of orbital floor, unspecified side, initial encounter for open fracture: Secondary | ICD-10-CM | POA: Diagnosis present

## 2015-07-29 DIAGNOSIS — S0219XA Other fracture of base of skull, initial encounter for closed fracture: Secondary | ICD-10-CM | POA: Diagnosis not present

## 2015-07-29 HISTORY — PX: ORIF ORBITAL FRACTURE: SHX5312

## 2015-07-29 LAB — POCT I-STAT, CHEM 8
BUN: 16 mg/dL (ref 6–20)
CALCIUM ION: 1.2 mmol/L (ref 1.13–1.30)
CHLORIDE: 103 mmol/L (ref 101–111)
CREATININE: 0.9 mg/dL (ref 0.44–1.00)
Glucose, Bld: 145 mg/dL — ABNORMAL HIGH (ref 65–99)
HCT: 40 % (ref 36.0–46.0)
Hemoglobin: 13.6 g/dL (ref 12.0–15.0)
Potassium: 3.8 mmol/L (ref 3.5–5.1)
SODIUM: 140 mmol/L (ref 135–145)
TCO2: 26 mmol/L (ref 0–100)

## 2015-07-29 SURGERY — OPEN REDUCTION INTERNAL FIXATION (ORIF) ORBITAL FRACTURE
Anesthesia: General | Site: Face | Laterality: Right

## 2015-07-29 MED ORDER — KCL IN DEXTROSE-NACL 20-5-0.45 MEQ/L-%-% IV SOLN
INTRAVENOUS | Status: DC
  Administered 2015-07-29: 15:00:00 via INTRAVENOUS
  Filled 2015-07-29: qty 1000

## 2015-07-29 MED ORDER — OXYCODONE HCL 5 MG/5ML PO SOLN: 5.0000 mg | Freq: Once | ORAL | Status: DC | PRN

## 2015-07-29 MED ORDER — ONDANSETRON HCL 4 MG/2ML IJ SOLN: 4.0000 mg | Freq: Four times a day (QID) | INTRAMUSCULAR | Status: DC | PRN

## 2015-07-29 MED ORDER — LIDOCAINE HCL 4 % MT SOLN
OROMUCOSAL | Status: DC | PRN
  Administered 2015-07-29: 3 mL via TOPICAL

## 2015-07-29 MED ORDER — PROPOFOL 10 MG/ML IV BOLUS
INTRAVENOUS | Status: DC | PRN
  Administered 2015-07-29: 100 mg via INTRAVENOUS

## 2015-07-29 MED ORDER — LIDOCAINE HCL (CARDIAC) 20 MG/ML IV SOLN
INTRAVENOUS | Status: AC
  Filled 2015-07-29: qty 5

## 2015-07-29 MED ORDER — TOBRAMYCIN-DEXAMETHASONE 0.3-0.1 % OP SUSP
OPHTHALMIC | Status: AC
  Filled 2015-07-29: qty 2.5

## 2015-07-29 MED ORDER — HYDROCODONE-ACETAMINOPHEN 5-325 MG PO TABS
1.0000 | ORAL_TABLET | ORAL | Status: DC | PRN
  Administered 2015-07-29 – 2015-07-30 (×4): 1 via ORAL
  Filled 2015-07-29 (×4): qty 1

## 2015-07-29 MED ORDER — EPHEDRINE SULFATE 50 MG/ML IJ SOLN
INTRAMUSCULAR | Status: DC | PRN
  Administered 2015-07-29: 10 mg via INTRAVENOUS

## 2015-07-29 MED ORDER — LIDOCAINE-EPINEPHRINE 1 %-1:100000 IJ SOLN
INTRAMUSCULAR | Status: DC | PRN
  Administered 2015-07-29: 2 mL

## 2015-07-29 MED ORDER — ONDANSETRON HCL 4 MG/2ML IJ SOLN
INTRAMUSCULAR | Status: AC
  Filled 2015-07-29: qty 2

## 2015-07-29 MED ORDER — BSS IO SOLN
INTRAOCULAR | Status: AC
  Filled 2015-07-29: qty 15

## 2015-07-29 MED ORDER — CEFAZOLIN SODIUM-DEXTROSE 2-3 GM-% IV SOLR
INTRAVENOUS | Status: DC | PRN
  Administered 2015-07-29: 2 g via INTRAVENOUS

## 2015-07-29 MED ORDER — TOBRAMYCIN-DEXAMETHASONE 0.3-0.1 % OP SUSP
OPHTHALMIC | Status: DC | PRN
  Administered 2015-07-29: 2 [drp] via OPHTHALMIC

## 2015-07-29 MED ORDER — DEXAMETHASONE SODIUM PHOSPHATE 10 MG/ML IJ SOLN
INTRAMUSCULAR | Status: AC
  Filled 2015-07-29: qty 1

## 2015-07-29 MED ORDER — NEOMYCIN-POLYMYXIN-DEXAMETH 3.5-10000-0.1 OP OINT
TOPICAL_OINTMENT | OPHTHALMIC | Status: AC
  Filled 2015-07-29: qty 3.5

## 2015-07-29 MED ORDER — CEFAZOLIN SODIUM-DEXTROSE 2-3 GM-% IV SOLR
INTRAVENOUS | Status: AC
  Filled 2015-07-29: qty 50

## 2015-07-29 MED ORDER — GLYCOPYRROLATE 0.2 MG/ML IJ SOLN
0.2000 mg | Freq: Once | INTRAMUSCULAR | Status: AC | PRN
  Administered 2015-07-29: 0.2 mg via INTRAVENOUS

## 2015-07-29 MED ORDER — OXYCODONE HCL 5 MG PO TABS: 5.0000 mg | ORAL_TABLET | Freq: Once | ORAL | Status: DC | PRN

## 2015-07-29 MED ORDER — LACTATED RINGERS IV SOLN
INTRAVENOUS | Status: DC
  Administered 2015-07-29: 10:00:00 via INTRAVENOUS

## 2015-07-29 MED ORDER — MORPHINE SULFATE (PF) 2 MG/ML IV SOLN: 2.0000 mg | INTRAVENOUS | Status: DC | PRN

## 2015-07-29 MED ORDER — MIDAZOLAM HCL 2 MG/2ML IJ SOLN: 1.0000 mg | INTRAMUSCULAR | Status: DC | PRN

## 2015-07-29 MED ORDER — PROMETHAZINE HCL 12.5 MG PO TABS: 12.5000 mg | ORAL_TABLET | Freq: Four times a day (QID) | ORAL | Status: DC | PRN

## 2015-07-29 MED ORDER — PROMETHAZINE HCL 12.5 MG RE SUPP: 12.5000 mg | Freq: Four times a day (QID) | RECTAL | Status: DC | PRN

## 2015-07-29 MED ORDER — FENTANYL CITRATE (PF) 100 MCG/2ML IJ SOLN
25.0000 ug | INTRAMUSCULAR | Status: DC | PRN
  Administered 2015-07-29: 25 ug via INTRAVENOUS

## 2015-07-29 MED ORDER — PHENYLEPHRINE HCL 10 MG/ML IJ SOLN
10.0000 mg | INTRAVENOUS | Status: DC | PRN
  Administered 2015-07-29: 50 ug/min via INTRAVENOUS

## 2015-07-29 MED ORDER — BSS IO SOLN
INTRAOCULAR | Status: DC | PRN
  Administered 2015-07-29: 15 mL via INTRAOCULAR

## 2015-07-29 MED ORDER — FENTANYL CITRATE (PF) 100 MCG/2ML IJ SOLN
INTRAMUSCULAR | Status: AC
  Filled 2015-07-29: qty 2

## 2015-07-29 MED ORDER — SCOPOLAMINE 1 MG/3DAYS TD PT72: 1.0000 | MEDICATED_PATCH | Freq: Once | TRANSDERMAL | Status: DC | PRN

## 2015-07-29 MED ORDER — SIMVASTATIN 20 MG PO TABS: 20.0000 mg | ORAL_TABLET | Freq: Every day | ORAL | Status: DC

## 2015-07-29 MED ORDER — VALSARTAN-HYDROCHLOROTHIAZIDE 160-12.5 MG PO TABS
1.0000 | ORAL_TABLET | ORAL | Status: DC
  Administered 2015-07-30: 1 via ORAL

## 2015-07-29 MED ORDER — ONDANSETRON HCL 4 MG/2ML IJ SOLN
INTRAMUSCULAR | Status: DC | PRN
  Administered 2015-07-29: 4 mg via INTRAVENOUS

## 2015-07-29 MED ORDER — FENTANYL CITRATE (PF) 100 MCG/2ML IJ SOLN
50.0000 ug | INTRAMUSCULAR | Status: AC | PRN
  Administered 2015-07-29: 25 ug via INTRAVENOUS
  Administered 2015-07-29: 50 ug via INTRAVENOUS
  Administered 2015-07-29: 25 ug via INTRAVENOUS
  Administered 2015-07-29: 50 ug via INTRAVENOUS
  Administered 2015-07-29 (×3): 25 ug via INTRAVENOUS

## 2015-07-29 MED ORDER — TOBRAMYCIN-DEXAMETHASONE 0.3-0.1 % OP SUSP
2.0000 [drp] | Freq: Four times a day (QID) | OPHTHALMIC | Status: DC
  Administered 2015-07-29 – 2015-07-30 (×3): 2 [drp] via OPHTHALMIC

## 2015-07-29 MED ORDER — TOBRAMYCIN-DEXAMETHASONE 0.3-0.1 % OP OINT
TOPICAL_OINTMENT | OPHTHALMIC | Status: AC
  Filled 2015-07-29: qty 3.5

## 2015-07-29 MED ORDER — BACITRACIN-NEOMYCIN-POLYMYXIN 400-5-5000 EX OINT
TOPICAL_OINTMENT | CUTANEOUS | Status: AC
  Filled 2015-07-29: qty 1

## 2015-07-29 MED ORDER — SUCCINYLCHOLINE CHLORIDE 20 MG/ML IJ SOLN
INTRAMUSCULAR | Status: AC
  Filled 2015-07-29: qty 1

## 2015-07-29 MED ORDER — LIDOCAINE-EPINEPHRINE 1 %-1:100000 IJ SOLN
INTRAMUSCULAR | Status: AC
  Filled 2015-07-29: qty 1

## 2015-07-29 MED ORDER — OXYMETAZOLINE HCL 0.05 % NA SOLN
NASAL | Status: AC
  Filled 2015-07-29: qty 15

## 2015-07-29 MED ORDER — SUCCINYLCHOLINE CHLORIDE 20 MG/ML IJ SOLN
INTRAMUSCULAR | Status: DC | PRN
  Administered 2015-07-29: 100 mg via INTRAVENOUS

## 2015-07-29 MED ORDER — DEXAMETHASONE SODIUM PHOSPHATE 4 MG/ML IJ SOLN
INTRAMUSCULAR | Status: DC | PRN
  Administered 2015-07-29: 5 mg via INTRAVENOUS

## 2015-07-29 MED ORDER — LIDOCAINE HCL (CARDIAC) 20 MG/ML IV SOLN
INTRAVENOUS | Status: DC | PRN
  Administered 2015-07-29: 60 mg via INTRAVENOUS

## 2015-07-29 MED ORDER — PHENYLEPHRINE HCL 10 MG/ML IJ SOLN
INTRAMUSCULAR | Status: DC | PRN
  Administered 2015-07-29 (×2): 80 ug via INTRAVENOUS

## 2015-07-29 SURGICAL SUPPLY — 48 items
APL SRG 3 HI ABS STRL LF PLS (MISCELLANEOUS) ×1
APPLICATOR DR MATTHEWS STRL (MISCELLANEOUS) ×3 IMPLANT
CANISTER SUCT 1200ML W/VALVE (MISCELLANEOUS) IMPLANT
CLEANER CAUTERY TIP 5X5 PAD (MISCELLANEOUS) IMPLANT
CORDS BIPOLAR (ELECTRODE) IMPLANT
DECANTER SPIKE VIAL GLASS SM (MISCELLANEOUS) IMPLANT
DEPRESSOR TONGUE BLADE STERILE (MISCELLANEOUS) IMPLANT
ELECT NDL BLADE 2-5/6 (NEEDLE) ×1 IMPLANT
ELECT NEEDLE BLADE 2-5/6 (NEEDLE) ×3 IMPLANT
ELECT REM PT RETURN 9FT ADLT (ELECTROSURGICAL) ×3
ELECTRODE REM PT RTRN 9FT ADLT (ELECTROSURGICAL) ×1 IMPLANT
GLOVE BIO SURGEON STRL SZ 6.5 (GLOVE) ×2 IMPLANT
GLOVE BIO SURGEON STRL SZ7.5 (GLOVE) ×3 IMPLANT
GLOVE BIO SURGEONS STRL SZ 6.5 (GLOVE) ×1
GLOVE BIOGEL PI IND STRL 7.0 (GLOVE) ×1 IMPLANT
GLOVE BIOGEL PI INDICATOR 7.0 (GLOVE) ×2
GLOVE EXAM NITRILE MD LF STRL (GLOVE) ×2 IMPLANT
GOWN STRL REUS W/ TWL LRG LVL3 (GOWN DISPOSABLE) ×2 IMPLANT
GOWN STRL REUS W/TWL LRG LVL3 (GOWN DISPOSABLE) ×6
IMPL BARRIER ORB 38X50X1.0 (Mesh General) ×1 IMPLANT
IMPL SURGICAL MEDPOR (Orthopedic Implant) IMPLANT
IMPLANT BARRIER ORB 38X50X1.0 (Mesh General) ×3 IMPLANT
IMPLANT SURGICAL MEDPOR (Orthopedic Implant) IMPLANT
NDL HYPO 25X1 1.5 SAFETY (NEEDLE) ×1 IMPLANT
NEEDLE HYPO 25X1 1.5 SAFETY (NEEDLE) ×3 IMPLANT
NS IRRIG 1000ML POUR BTL (IV SOLUTION) ×3 IMPLANT
PACK BASIN DAY SURGERY FS (CUSTOM PROCEDURE TRAY) ×3 IMPLANT
PACK ENT DAY SURGERY (CUSTOM PROCEDURE TRAY) ×3 IMPLANT
PAD CLEANER CAUTERY TIP 5X5 (MISCELLANEOUS)
PATTIES SURGICAL .5 X3 (DISPOSABLE) IMPLANT
PENCIL FOOT CONTROL (ELECTRODE) ×3 IMPLANT
SCREW UPPERFACE1.2X3M SLFDRILL (Screw) ×4 IMPLANT
SHEILD EYE MED CORNL SHD 22X21 (OPHTHALMIC RELATED) ×3
SHIELD EYE MED CORNL SHD 22X21 (OPHTHALMIC RELATED) ×1 IMPLANT
SLEEVE SCD COMPRESS KNEE MED (MISCELLANEOUS) ×3 IMPLANT
SUT 6 0 SILK T G140 8DA (SUTURE) ×9 IMPLANT
SUT CHROMIC 4 0 P 3 18 (SUTURE) IMPLANT
SUT ETHIBOND 3-0 V-5 (SUTURE) IMPLANT
SUT ETHILON 4 0 P 3 18 (SUTURE) IMPLANT
SUT ETHILON 6 0 P 1 (SUTURE) IMPLANT
SUT MON AB 3-0 SH 27 (SUTURE)
SUT MON AB 3-0 SH27 (SUTURE) ×1 IMPLANT
SUT PLAIN 6 0 TG1408 (SUTURE) ×3 IMPLANT
SUT SILK 4 0 P 3 (SUTURE) ×2 IMPLANT
SUT SILK 6 0 P 1 (SUTURE) ×2 IMPLANT
SYR BULB 3OZ (MISCELLANEOUS) IMPLANT
TOWEL OR 17X24 6PK STRL BLUE (TOWEL DISPOSABLE) ×3 IMPLANT
TRAY DSU PREP LF (CUSTOM PROCEDURE TRAY) ×3 IMPLANT

## 2015-07-29 NOTE — Anesthesia Procedure Notes (Signed)
Procedure Name: Intubation Date/Time: 07/29/2015 10:32 AM Performed by: Maryella Shivers Pre-anesthesia Checklist: Patient identified, Emergency Drugs available, Suction available and Patient being monitored Patient Re-evaluated:Patient Re-evaluated prior to inductionOxygen Delivery Method: Circle System Utilized Preoxygenation: Pre-oxygenation with 100% oxygen Intubation Type: IV induction Ventilation: Mask ventilation without difficulty Laryngoscope Size: Mac and 3 Grade View: Grade II Tube type: Oral Number of attempts: 1 Airway Equipment and Method: Stylet and Oral airway Placement Confirmation: ETT inserted through vocal cords under direct vision,  positive ETCO2 and breath sounds checked- equal and bilateral Secured at: 20 cm Tube secured with: Tape Dental Injury: Teeth and Oropharynx as per pre-operative assessment

## 2015-07-29 NOTE — Transfer of Care (Signed)
Immediate Anesthesia Transfer of Care Note  Patient: Tara Bennett  Procedure(s) Performed: Procedure(s): RIGHT ORBITAL FLOOR REPAIR  (Right)  Patient Location: PACU  Anesthesia Type:General  Level of Consciousness: awake, alert  and oriented  Airway & Oxygen Therapy: Patient Spontanous Breathing and Patient connected to face mask oxygen  Post-op Assessment: Report given to RN and Post -op Vital signs reviewed and stable  Post vital signs: Reviewed and stable  Last Vitals:  Filed Vitals:   07/29/15 0932  BP: 118/74  Pulse: 85  Temp: 36.4 C  Resp: 18    Complications: No apparent anesthesia complications

## 2015-07-29 NOTE — Anesthesia Postprocedure Evaluation (Signed)
Anesthesia Post Note  Patient: Tara Bennett  Procedure(s) Performed: Procedure(s) (LRB): RIGHT ORBITAL FLOOR REPAIR  (Right)  Patient location during evaluation: PACU Anesthesia Type: General Level of consciousness: awake and alert and patient cooperative Pain management: pain level controlled Vital Signs Assessment: post-procedure vital signs reviewed and stable Respiratory status: spontaneous breathing and respiratory function stable Cardiovascular status: stable Anesthetic complications: no    Last Vitals:  Filed Vitals:   07/29/15 1245 07/29/15 1300  BP: 126/69 124/60  Pulse: 91 83  Temp:    Resp: 21 16    Last Pain:  Filed Vitals:   07/29/15 1311  PainSc: 0-No pain                 Binta Statzer S

## 2015-07-29 NOTE — Anesthesia Preprocedure Evaluation (Signed)
Anesthesia Evaluation  Patient identified by MRN, date of birth, ID band Patient awake    Reviewed: Allergy & Precautions, NPO status , Patient's Chart, lab work & pertinent test results  Airway Mallampati: II   Neck ROM: full    Dental   Pulmonary former smoker,    breath sounds clear to auscultation       Cardiovascular hypertension,  Rhythm:regular Rate:Normal     Neuro/Psych    GI/Hepatic   Endo/Other  obese  Renal/GU      Musculoskeletal  (+) Arthritis ,   Abdominal   Peds  Hematology   Anesthesia Other Findings   Reproductive/Obstetrics                             Anesthesia Physical Anesthesia Plan  ASA: III  Anesthesia Plan: General   Post-op Pain Management:    Induction: Intravenous  Airway Management Planned: LMA  Additional Equipment:   Intra-op Plan:   Post-operative Plan:   Informed Consent: I have reviewed the patients History and Physical, chart, labs and discussed the procedure including the risks, benefits and alternatives for the proposed anesthesia with the patient or authorized representative who has indicated his/her understanding and acceptance.     Plan Discussed with: CRNA, Anesthesiologist and Surgeon  Anesthesia Plan Comments:         Anesthesia Quick Evaluation

## 2015-07-29 NOTE — H&P (Signed)
Tara Bennett is an 81 y.o. female.   Chief Complaint: Right orbital blowout fracture HPI: 81 year old female with right orbital blowout fracture from fall on 11/28.  Presents for surgical repair.  Past Medical History  Diagnosis Date  . History of urethral stricture     freq/ urge  . Hypertension   . Arthritis   . Wears hearing aid   . Urethral stricture   . Urinary retention     Past Surgical History  Procedure Laterality Date  . Cysto/ urethral dilation/ fulgeration of bleeder  10-16-10  . Cystoscopy with urethral dilatation  07/02/2011    Procedure: CYSTOSCOPY WITH URETHRAL DILATATION;  Surgeon: Malka So;  Location: [facility];  Service: Urology;  Laterality: N/A;  . Cystoscopy with urethral dilatation N/A 08/24/2013    Procedure: CYSTOSCOPY WITH URETHRAL DILATATION;  Surgeon: Irine Seal, MD;  Location: [facility];  Service: Urology;  Laterality: N/A;  . Cystoscopy with urethral dilatation N/A 05/02/2015    Procedure: CYSTOSCOPY WITH URETHRAL DILATATION, FULGURATION BLADDER NECK;  Surgeon: Irine Seal, MD;  Location: [facility];  Service: Urology;  Laterality: N/A;    History reviewed. No pertinent family history. Social History:  reports that she quit smoking about 51 years ago. She has never used smokeless tobacco. She reports that she drinks alcohol. She reports that she does not use illicit drugs.  Allergies:  Allergies  Allergen Reactions  . Furosemide Other (See Comments)    Excessive Diarrhea.    Medications Prior to Admission  Medication Sig Dispense Refill  . acetaminophen (TYLENOL) 650 MG CR tablet Take 650 mg by mouth every 8 (eight) hours as needed for pain.    Marland Kitchen aspirin 81 MG tablet Take 81 mg by mouth daily as needed for pain.     . calcium citrate-vitamin D 200-200 MG-UNIT TABS Take 1 tablet by mouth 3 (three) times daily.     Marland Kitchen co-enzyme Q-10 30 MG capsule Take 30 mg by mouth daily.    . diclofenac  sodium (VOLTAREN) 1 % GEL Apply 2 g topically at bedtime.     . fish oil-omega-3 fatty acids 1000 MG capsule Take 2 g by mouth daily.      . Multiple Vitamins-Minerals (CENTRUM SILVER PO) Take 1 tablet by mouth daily.     . simvastatin (ZOCOR) 20 MG tablet Take 20 mg by mouth at bedtime.      . valsartan-hydrochlorothiazide (DIOVAN-HCT) 160-12.5 MG per tablet Take 1 tablet by mouth every morning.        Results for orders placed or performed during the hospital encounter of 07/29/15 (from the past 48 hour(s))  I-STAT, chem 8     Status: Abnormal   Collection Time: 07/29/15  9:46 AM  Result Value Ref Range   Sodium 140 135 - 145 mmol/L   Potassium 3.8 3.5 - 5.1 mmol/L   Chloride 103 101 - 111 mmol/L   BUN 16 6 - 20 mg/dL   Creatinine, Ser 0.90 0.44 - 1.00 mg/dL   Glucose, Bld 145 (H) 65 - 99 mg/dL   Calcium, Ion 1.20 1.13 - 1.30 mmol/L   TCO2 26 0 - 100 mmol/L   Hemoglobin 13.6 12.0 - 15.0 g/dL   HCT 40.0 36.0 - 46.0 %   No results found.  Review of Systems  Neurological:       Right cheek and upper lip numbness.  All other systems reviewed and are negative.   Blood  pressure 118/74, pulse 85, temperature 97.5 F (36.4 C), resp. rate 18, height 5\' 2"  (1.575 m), weight 78.189 kg (172 lb 6 oz), SpO2 98 %. Physical Exam  Constitutional: She is oriented to person, place, and time. She appears well-developed and well-nourished. No distress.  HENT:  Head: Normocephalic and atraumatic.  Right Ear: External ear normal.  Left Ear: External ear normal.  Nose: Nose normal.  Mouth/Throat: Oropharynx is clear and moist.  Right infraorbital ecchymosis.  Eyes: Conjunctivae are normal. Pupils are equal, round, and reactive to light.  Right globe slightly posterior.  EOMI except for slight decrease in superior gaze in right eye.  Neck: Normal range of motion. Neck supple.  Cardiovascular: Normal rate.   Respiratory: Effort normal.  Musculoskeletal: Normal range of motion.  Neurological:  She is alert and oriented to person, place, and time. No cranial nerve deficit.  Right cheek numbness.  Skin: Skin is warm and dry.  Psychiatric: She has a normal mood and affect. Her behavior is normal. Judgment and thought content normal.     Assessment/Plan Right orbital blowout fracture To OR for right orbital floor repair.  Likely observe overnight.  [name] 07/29/2015, 9:53 AM

## 2015-07-29 NOTE — Brief Op Note (Signed)
07/29/2015  12:26 PM  PATIENT:  Tara Bennett  81 y.o. female  PRE-OPERATIVE DIAGNOSIS:  RIGHT ORBITAL FLOOR FRACTURE   POST-OPERATIVE DIAGNOSIS:  RIGHT ORBITAL FLOOR FRACTURE   PROCEDURE:  Procedure(s): RIGHT ORBITAL FLOOR REPAIR  (Right)  SURGEON:  Surgeon(s) and Role:    * [name], MD - Primary  PHYSICIAN ASSISTANT:   ASSISTANTS: none   ANESTHESIA:   general  EBL:  Total I/O In: 1000 [I.V.:1000] Out: -   BLOOD ADMINISTERED:none  DRAINS: none   LOCAL MEDICATIONS USED:  LIDOCAINE   SPECIMEN:  No Specimen  DISPOSITION OF SPECIMEN:  N/A  COUNTS:  YES  TOURNIQUET:  * No tourniquets in log *  DICTATION: .Other Dictation: Dictation Number [id number]  PLAN OF CARE: Admit for overnight observation  PATIENT DISPOSITION:  PACU - hemodynamically stable.   Delay start of Pharmacological VTE agent (>24hrs) due to surgical blood loss or risk of bleeding: yes

## 2015-07-30 ENCOUNTER — Encounter (HOSPITAL_BASED_OUTPATIENT_CLINIC_OR_DEPARTMENT_OTHER): Payer: Self-pay | Admitting: Otolaryngology

## 2015-07-30 DIAGNOSIS — Z79899 Other long term (current) drug therapy: Secondary | ICD-10-CM | POA: Diagnosis not present

## 2015-07-30 DIAGNOSIS — M1990 Unspecified osteoarthritis, unspecified site: Secondary | ICD-10-CM | POA: Diagnosis not present

## 2015-07-30 DIAGNOSIS — Z87891 Personal history of nicotine dependence: Secondary | ICD-10-CM | POA: Diagnosis not present

## 2015-07-30 DIAGNOSIS — S0231XA Fracture of orbital floor, right side, initial encounter for closed fracture: Secondary | ICD-10-CM | POA: Diagnosis not present

## 2015-07-30 DIAGNOSIS — Z7982 Long term (current) use of aspirin: Secondary | ICD-10-CM | POA: Diagnosis not present

## 2015-07-30 DIAGNOSIS — I10 Essential (primary) hypertension: Secondary | ICD-10-CM | POA: Diagnosis not present

## 2015-07-30 MED ORDER — HYDROCODONE-ACETAMINOPHEN 5-325 MG PO TABS: 1.0000 | ORAL_TABLET | Freq: Four times a day (QID) | ORAL | Status: DC | PRN

## 2015-07-30 NOTE — Op Note (Signed)
NAMEMarland Kitchen  ANGELIS, KAZAN NO.:  [id number]  MEDICAL RECORD NO.:  [id number]  LOCATION:                               FACILITY:  [facility]  PHYSICIAN:  Onnie Graham, MD     DATE OF BIRTH:  [birth year]  DATE OF PROCEDURE:  07/29/2015 DATE OF DISCHARGE:                              OPERATIVE REPORT   PREOPERATIVE DIAGNOSIS:  Right orbital blowout fracture.  POSTOPERATIVE DIAGNOSIS:  Right orbital blowout fracture.  PROCEDURE:  Repair of right orbital blowout fracture with Medpor implant.  SURGEON:  Leane Para. Redmond Baseman, MD  ANESTHESIA:  General endotracheal anesthesia.  COMPLICATIONS:  None.  INDICATION:  The patient is an 81 year old female who fell on November 28th, striking her right face and sustaining a right orbital blowout fracture.  The segment that is displaced inferiorly is fairly large and so she presents to the operating room for surgical management.  FINDINGS:  Much of the more posterior orbital floor was displaced inferiorly with orbital contents as well.  A 1 mm thick Medpor implant was placed in the orbital floor.  DESCRIPTION OF PROCEDURE:  The patient was identified in the holding room and informed consent having been obtained including discussion of risks, benefits, alternatives, the patient was brought to the operative suite, and put on the operative table in supine position.  Anesthesia was induced.  The patient was intubated by Anesthesia team without difficulty.  The patient was given intravenous antibiotics during the case.  The eyes were lubricated and the bed was turned 90 degrees from anesthesia.  The mid and upper face were prepped and draped in sterile fashion.  The right lower eyelid and lateral canthus were injected with 1% lidocaine with 1:100,000 epinephrine.  The lateral canthus was crushed with a straight hemostat and then divided using scissors and a 15 blade scalpel.  The inferior limb of the lateral canthal tendon was then  divided using scissors and marked with a marking suture.  The conjunctiva was then incised along the inferior border of the tarsal plate using scissors.  6-0 silk suture was then placed in 4 positions along the incision line allowing superior and inferior traction sutures. The eyelid was then dissected using blunt and sharp dissection along the under surface of the orbicularis oculi muscle down to the orbital rim. The soft tissue over the rim was then incised with the needle-tip Bovie electrocautery.  Soft tissues were then elevated off the rim using a Soil scientist both onto the maxilla slightly and then into the orbital floor.  The malleable was used to elevate in order to hold the orbital tissues up as the orbital floor was then dissected using a Soil scientist.  This was done back to the leading edge of the fracture and then soft tissues were elevated out of the maxillary sinus and back into the orbital space.  The native edge of the orbital bone was identified laterally and medially.  With soft tissues elevated, the 48 x 40 x 1 Medpor implant was opened and then trimmed to fit the defect extending back to the posterior extent of the fracture, but also overlapping the lateral and medial, her native orbital bone segments.  The implant  was then further trimmed to fit the right position.  It was then laid down into the floor of the orbit elevating the orbital tissues.  The plates extending anteriorly from the orbital implant were then trimmed to just 1 hole on each plate and 2 screws were then placed in the medial holes along the orbital rim consisting of 3 mm self drilling screws.  The lateral holes were not screwed because of the lack of good purchase of the screws in those positions.  Now, with the plate in proper position, again the soft tissues were elevated to lay them over top of the implant and tissues were laid back in place.  A corneal shield was placed over the eye during the  entirety of the procedure and was now removed in order to do a forced duction test of the inferior rectus which was performed and yielded good eye movement.  A shield was repositioned. The conjunctival incision was then closed with 6-0 plain gut in simple running fashion.  The lateral canthal tendon was then re-established using a 4-0 silk suture to the periosteum of the orbital rim.  The grey line was then closed with 6-0 silk suture as was the lateral canthal incision securing the sutures into a lateral position.  At this point, the corneal suture was removed and I positioned and eye pressure felt equal to eyes.  TobraDex drops were added to the eye.  She was then returned to anesthesia for wake up and was extubated, moved to recovery room in stable condition.     Onnie Graham, MD     DDB/MEDQ  D:  07/29/2015  T:  07/30/2015  Job:  [id number]

## 2015-07-30 NOTE — Discharge Summary (Signed)
Physician Discharge Summary  Patient ID: Tara Bennett MRN: [id number] DOB/AGE: [birth year] 81 y.o.  Admit date: 07/29/2015 Discharge date: 07/30/2015  Admission Diagnoses:  Right orbital floor blow-out fracture  Discharge Diagnoses:  Active Problems:   Orbital floor (blow-out) open fracture [facility])   Discharged Condition: good  Hospital Course: 81 year old female who fell 11/28 and sustained a right orbital blow-out fracture presented to OR for repair.  See operative note.  She was observed overnight and had no problems.  Pain was well-controlled.  On POD 1, she was felt stable for discharge.  Consults: None  Significant Diagnostic Studies: None  Treatments: surgery: Right orbital floor repair  Discharge Exam: Blood pressure 126/69, pulse 98, temperature 97.2 F (36.2 C), resp. rate 16, height 5\' 2"  (1.575 m), weight 78.189 kg (172 lb 6 oz), SpO2 95 %. General appearance: alert, cooperative and no distress Eyes: Right periorbital edema and ecchymosis but able to open eye.  Right EOM intact except for sluggish upward gaze.  Disposition: 01-Home or Self Care  Discharge Instructions    Diet - low sodium heart healthy    Complete by:  As directed      Discharge instructions    Complete by:  As directed   Keep cold compress on right eye for two days.  Keep head elevated.  Dose Tobradex eye drops 1-2 drops to right eye four times per day.     Increase activity slowly    Complete by:  As directed             Medication List    TAKE these medications        acetaminophen 650 MG CR tablet  Commonly known as:  TYLENOL  Take 650 mg by mouth every 8 (eight) hours as needed for pain.     aspirin 81 MG tablet  Take 81 mg by mouth daily as needed for pain.     calcium citrate-vitamin D 200-200 MG-UNIT Tabs  Take 1 tablet by mouth 3 (three) times daily.     CENTRUM SILVER PO  Take 1 tablet by mouth daily.     co-enzyme Q-10 30 MG capsule  Take 30 mg by mouth daily.     diclofenac sodium 1 % Gel  Commonly known as:  VOLTAREN  Apply 2 g topically at bedtime.     fish oil-omega-3 fatty acids 1000 MG capsule  Take 2 g by mouth daily.     HYDROcodone-acetaminophen 5-325 MG tablet  Commonly known as:  NORCO/VICODIN  Take 1-2 tablets by mouth every 6 (six) hours as needed for moderate pain.     simvastatin 20 MG tablet  Commonly known as:  ZOCOR  Take 20 mg by mouth at bedtime.     valsartan-hydrochlorothiazide 160-12.5 MG tablet  Commonly known as:  DIOVAN-HCT  Take 1 tablet by mouth every morning.           Follow-up Information    Follow up with Marlaina Coburn, MD. Schedule an appointment as soon as possible for a visit on 08/05/2015.   Specialty:  Otolaryngology   Contact information:   [address] [place] [address] [phone]       Signed: [name] 07/30/2015, 8:31 AM

## 2015-08-06 DIAGNOSIS — S0230XA Fracture of orbital floor, unspecified side, initial encounter for closed fracture: Secondary | ICD-10-CM | POA: Diagnosis not present

## 2015-09-10 DIAGNOSIS — S0230XA Fracture of orbital floor, unspecified side, initial encounter for closed fracture: Secondary | ICD-10-CM | POA: Diagnosis not present

## 2015-09-10 DIAGNOSIS — H40003 Preglaucoma, unspecified, bilateral: Secondary | ICD-10-CM | POA: Diagnosis not present

## 2015-10-08 DIAGNOSIS — H401132 Primary open-angle glaucoma, bilateral, moderate stage: Secondary | ICD-10-CM | POA: Diagnosis not present

## 2015-10-08 DIAGNOSIS — H25813 Combined forms of age-related cataract, bilateral: Secondary | ICD-10-CM | POA: Diagnosis not present

## 2015-11-18 DIAGNOSIS — N952 Postmenopausal atrophic vaginitis: Secondary | ICD-10-CM | POA: Diagnosis not present

## 2015-11-18 DIAGNOSIS — R339 Retention of urine, unspecified: Secondary | ICD-10-CM | POA: Diagnosis not present

## 2015-11-18 DIAGNOSIS — Z0000 Encounter for general adult medical examination without abnormal findings: Secondary | ICD-10-CM | POA: Diagnosis not present

## 2015-11-18 DIAGNOSIS — R351 Nocturia: Secondary | ICD-10-CM | POA: Diagnosis not present

## 2015-11-18 DIAGNOSIS — N3941 Urge incontinence: Secondary | ICD-10-CM | POA: Diagnosis not present

## 2015-12-12 DIAGNOSIS — S0231XD Fracture of orbital floor, right side, subsequent encounter for fracture with routine healing: Secondary | ICD-10-CM | POA: Diagnosis not present

## 2015-12-12 DIAGNOSIS — Z6830 Body mass index (BMI) 30.0-30.9, adult: Secondary | ICD-10-CM | POA: Diagnosis not present

## 2015-12-12 DIAGNOSIS — M810 Age-related osteoporosis without current pathological fracture: Secondary | ICD-10-CM | POA: Diagnosis not present

## 2015-12-12 DIAGNOSIS — M1990 Unspecified osteoarthritis, unspecified site: Secondary | ICD-10-CM | POA: Diagnosis not present

## 2015-12-12 DIAGNOSIS — E784 Other hyperlipidemia: Secondary | ICD-10-CM | POA: Diagnosis not present

## 2015-12-12 DIAGNOSIS — N318 Other neuromuscular dysfunction of bladder: Secondary | ICD-10-CM | POA: Diagnosis not present

## 2015-12-12 DIAGNOSIS — I10 Essential (primary) hypertension: Secondary | ICD-10-CM | POA: Diagnosis not present

## 2015-12-12 DIAGNOSIS — I872 Venous insufficiency (chronic) (peripheral): Secondary | ICD-10-CM | POA: Diagnosis not present

## 2015-12-16 DIAGNOSIS — H2511 Age-related nuclear cataract, right eye: Secondary | ICD-10-CM | POA: Diagnosis not present

## 2015-12-16 DIAGNOSIS — H401112 Primary open-angle glaucoma, right eye, moderate stage: Secondary | ICD-10-CM | POA: Diagnosis not present

## 2015-12-16 DIAGNOSIS — H401312 Pigmentary glaucoma, right eye, moderate stage: Secondary | ICD-10-CM | POA: Diagnosis not present

## 2016-01-06 DIAGNOSIS — H25812 Combined forms of age-related cataract, left eye: Secondary | ICD-10-CM | POA: Diagnosis not present

## 2016-01-06 DIAGNOSIS — H401122 Primary open-angle glaucoma, left eye, moderate stage: Secondary | ICD-10-CM | POA: Diagnosis not present

## 2016-01-06 DIAGNOSIS — H401132 Primary open-angle glaucoma, bilateral, moderate stage: Secondary | ICD-10-CM | POA: Diagnosis not present

## 2016-04-07 DIAGNOSIS — H401132 Primary open-angle glaucoma, bilateral, moderate stage: Secondary | ICD-10-CM | POA: Diagnosis not present

## 2016-05-08 DIAGNOSIS — R351 Nocturia: Secondary | ICD-10-CM | POA: Diagnosis not present

## 2016-05-20 DIAGNOSIS — R3914 Feeling of incomplete bladder emptying: Secondary | ICD-10-CM | POA: Diagnosis not present

## 2016-05-20 DIAGNOSIS — N3020 Other chronic cystitis without hematuria: Secondary | ICD-10-CM | POA: Diagnosis not present

## 2016-05-28 DIAGNOSIS — Z23 Encounter for immunization: Secondary | ICD-10-CM | POA: Diagnosis not present

## 2016-06-16 DIAGNOSIS — S0231XD Fracture of orbital floor, right side, subsequent encounter for fracture with routine healing: Secondary | ICD-10-CM | POA: Diagnosis not present

## 2016-06-16 DIAGNOSIS — Z6830 Body mass index (BMI) 30.0-30.9, adult: Secondary | ICD-10-CM | POA: Diagnosis not present

## 2016-06-16 DIAGNOSIS — I872 Venous insufficiency (chronic) (peripheral): Secondary | ICD-10-CM | POA: Diagnosis not present

## 2016-06-16 DIAGNOSIS — M1990 Unspecified osteoarthritis, unspecified site: Secondary | ICD-10-CM | POA: Diagnosis not present

## 2016-06-16 DIAGNOSIS — J302 Other seasonal allergic rhinitis: Secondary | ICD-10-CM | POA: Diagnosis not present

## 2016-06-16 DIAGNOSIS — E784 Other hyperlipidemia: Secondary | ICD-10-CM | POA: Diagnosis not present

## 2016-06-16 DIAGNOSIS — I10 Essential (primary) hypertension: Secondary | ICD-10-CM | POA: Diagnosis not present

## 2016-06-16 DIAGNOSIS — M810 Age-related osteoporosis without current pathological fracture: Secondary | ICD-10-CM | POA: Diagnosis not present

## 2016-06-16 DIAGNOSIS — Z1389 Encounter for screening for other disorder: Secondary | ICD-10-CM | POA: Diagnosis not present

## 2016-06-16 DIAGNOSIS — N318 Other neuromuscular dysfunction of bladder: Secondary | ICD-10-CM | POA: Diagnosis not present

## 2016-06-24 DIAGNOSIS — N3021 Other chronic cystitis with hematuria: Secondary | ICD-10-CM | POA: Diagnosis not present

## 2016-06-24 DIAGNOSIS — N390 Urinary tract infection, site not specified: Secondary | ICD-10-CM | POA: Diagnosis not present

## 2016-06-24 DIAGNOSIS — B9620 Unspecified Escherichia coli [E. coli] as the cause of diseases classified elsewhere: Secondary | ICD-10-CM | POA: Diagnosis not present

## 2016-06-29 DIAGNOSIS — G8929 Other chronic pain: Secondary | ICD-10-CM | POA: Diagnosis not present

## 2016-06-29 DIAGNOSIS — M25562 Pain in left knee: Secondary | ICD-10-CM | POA: Diagnosis not present

## 2016-06-29 DIAGNOSIS — M1712 Unilateral primary osteoarthritis, left knee: Secondary | ICD-10-CM | POA: Diagnosis not present

## 2016-10-12 DIAGNOSIS — H401132 Primary open-angle glaucoma, bilateral, moderate stage: Secondary | ICD-10-CM | POA: Diagnosis not present

## 2016-12-02 DIAGNOSIS — N3020 Other chronic cystitis without hematuria: Secondary | ICD-10-CM | POA: Diagnosis not present

## 2016-12-02 DIAGNOSIS — R351 Nocturia: Secondary | ICD-10-CM | POA: Diagnosis not present

## 2016-12-02 DIAGNOSIS — N952 Postmenopausal atrophic vaginitis: Secondary | ICD-10-CM | POA: Diagnosis not present

## 2016-12-11 DIAGNOSIS — M810 Age-related osteoporosis without current pathological fracture: Secondary | ICD-10-CM | POA: Diagnosis not present

## 2016-12-11 DIAGNOSIS — J302 Other seasonal allergic rhinitis: Secondary | ICD-10-CM | POA: Diagnosis not present

## 2016-12-11 DIAGNOSIS — E784 Other hyperlipidemia: Secondary | ICD-10-CM | POA: Diagnosis not present

## 2016-12-11 DIAGNOSIS — S0231XD Fracture of orbital floor, right side, subsequent encounter for fracture with routine healing: Secondary | ICD-10-CM | POA: Diagnosis not present

## 2016-12-11 DIAGNOSIS — I872 Venous insufficiency (chronic) (peripheral): Secondary | ICD-10-CM | POA: Diagnosis not present

## 2016-12-11 DIAGNOSIS — N390 Urinary tract infection, site not specified: Secondary | ICD-10-CM | POA: Diagnosis not present

## 2016-12-11 DIAGNOSIS — R8299 Other abnormal findings in urine: Secondary | ICD-10-CM | POA: Diagnosis not present

## 2016-12-11 DIAGNOSIS — I10 Essential (primary) hypertension: Secondary | ICD-10-CM | POA: Diagnosis not present

## 2016-12-11 DIAGNOSIS — M1990 Unspecified osteoarthritis, unspecified site: Secondary | ICD-10-CM | POA: Diagnosis not present

## 2016-12-11 DIAGNOSIS — Z6830 Body mass index (BMI) 30.0-30.9, adult: Secondary | ICD-10-CM | POA: Diagnosis not present

## 2016-12-11 DIAGNOSIS — N318 Other neuromuscular dysfunction of bladder: Secondary | ICD-10-CM | POA: Diagnosis not present

## 2017-01-23 IMAGING — CT CT MAXILLOFACIAL W/O CM
3 of 5 series · 15 of 47 positions shown, 18 images · non-contrast
Comparison: None.

CLINICAL DATA: 87-year-old female with acute headache and right
facial pain following fall today. Initial encounter.

EXAM:
CT HEAD WITHOUT CONTRAST
CT MAXILLOFACIAL WITHOUT CONTRAST
TECHNIQUE: Multidetector CT imaging of the head and maxillofacial structures
were performed using the standard protocol without intravenous
contrast. Multiplanar CT image reconstructions of the maxillofacial
structures were also generated.

[Series 5: facial st · axial · 0.33mm/px · z∈[-233,-113]mm · 9 of 72 slices shown, 12 images]
[im 6/72  brain]
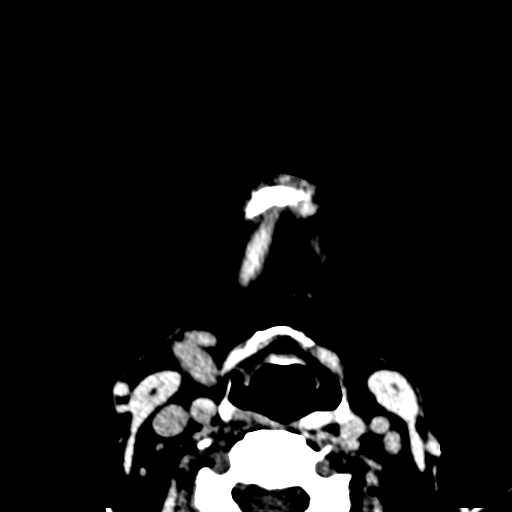
[im 6/72  bone]
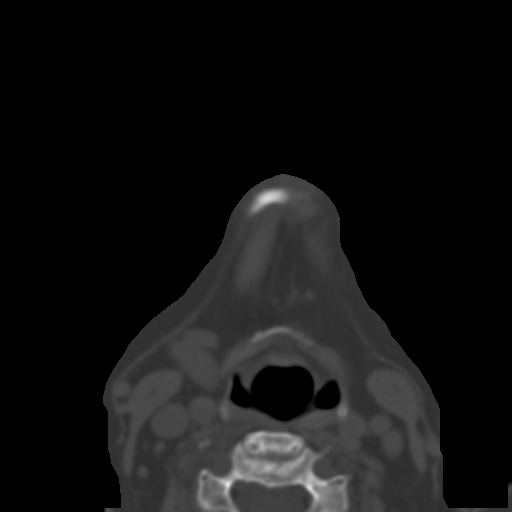
[im 17/72  bone]
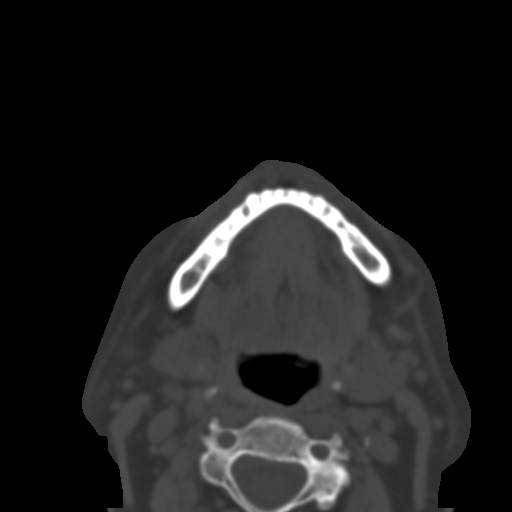
[im 22/72  bone]
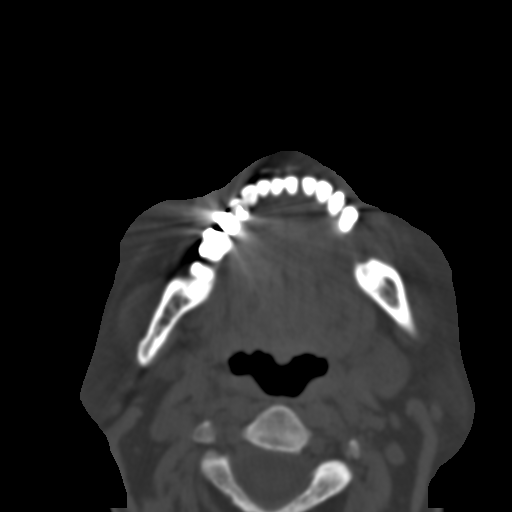
[im 28/72  bone]
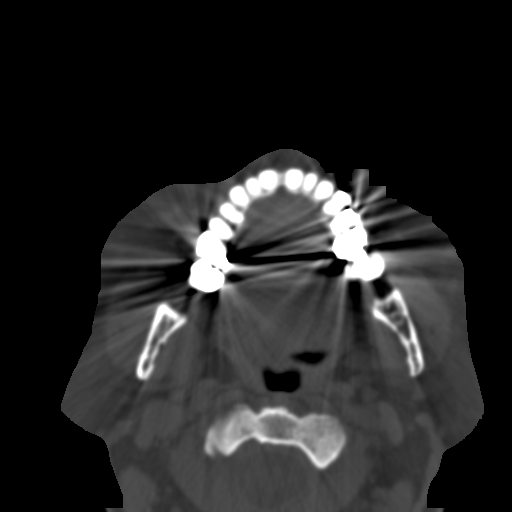
[im 39/72  brain]
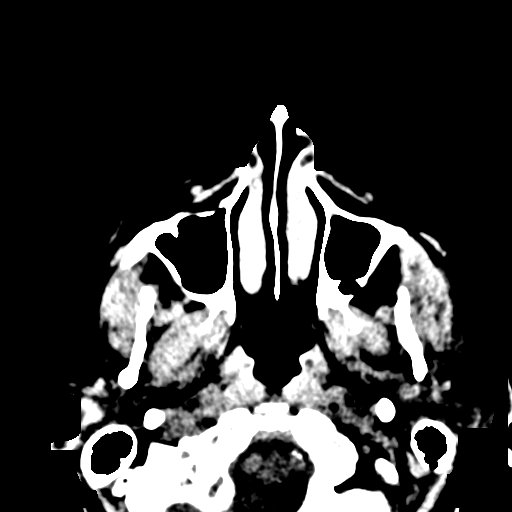
[im 39/72  bone]
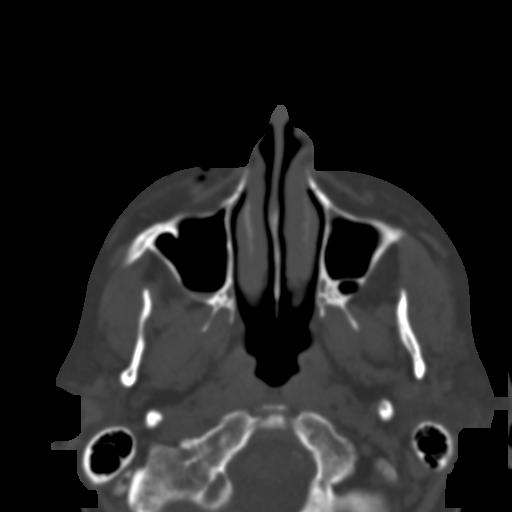
[im 44/72  bone]
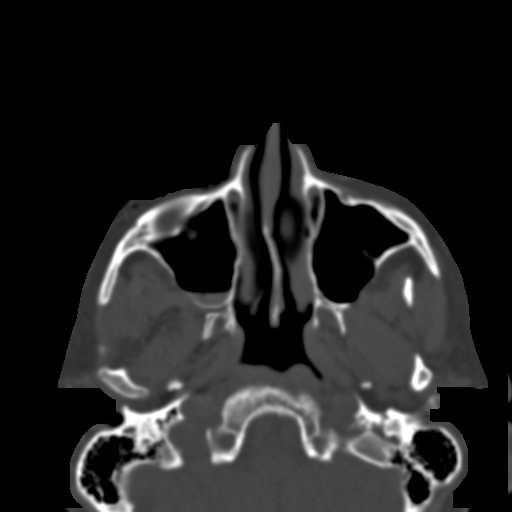
[im 50/72  bone]
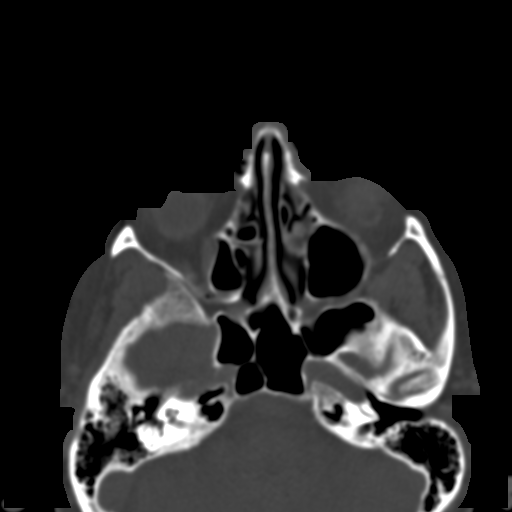
[im 61/72  bone]
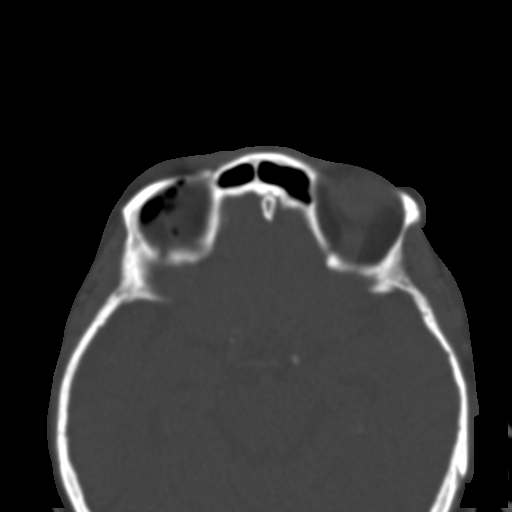
[im 66/72  brain]
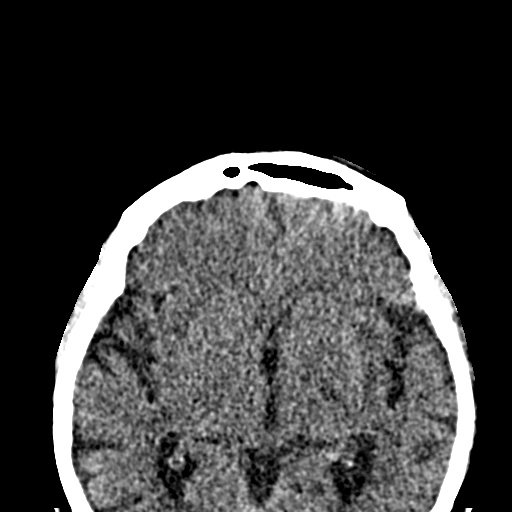
[im 66/72  bone]
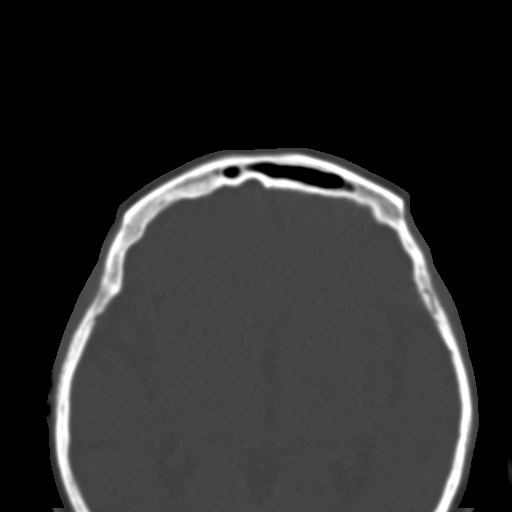

[Series 7: coronal st · coronal · 0.28mm/px · 3 of 61 slices shown]
[im 21/61  bone]
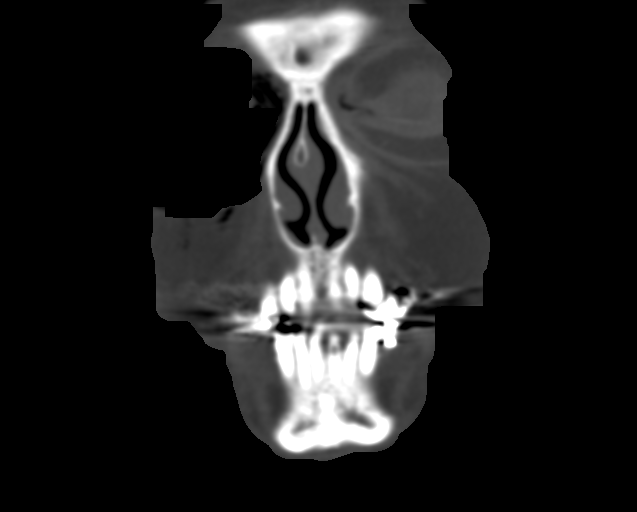
[im 27/61  bone]
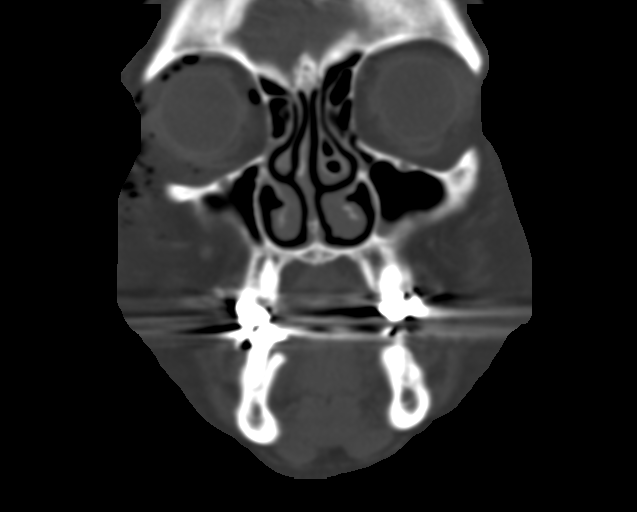
[im 34/61  bone]
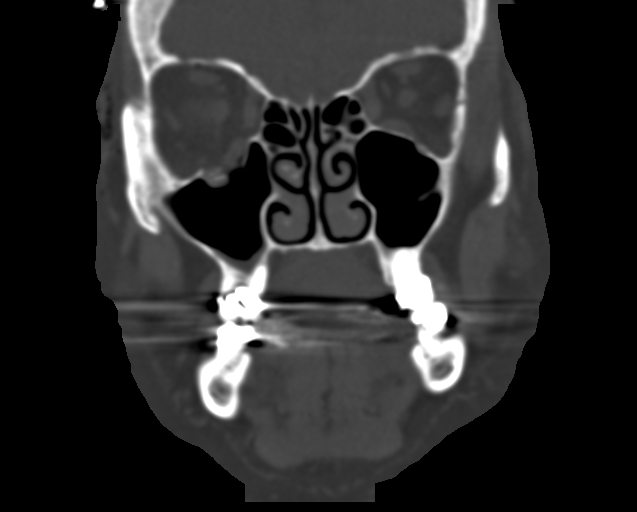

[Series 8: sagittal st · sagittal · 0.25mm/px · 3 of 79 slices shown]
[im 27/79  bone]
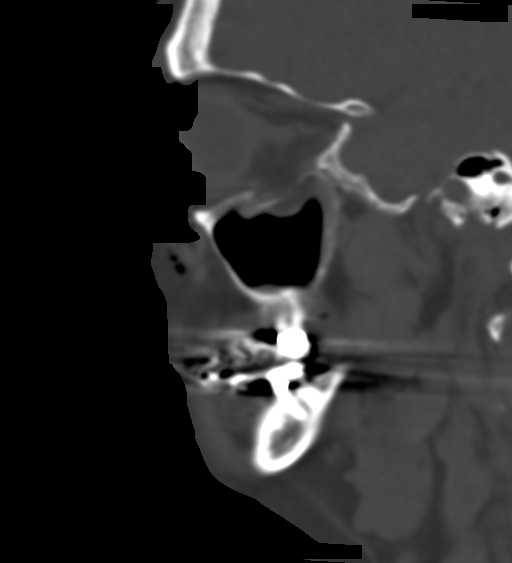
[im 40/79  bone]
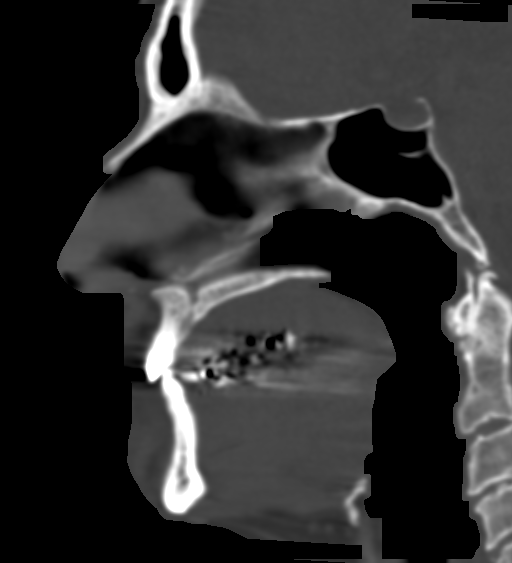
[im 53/79  bone]
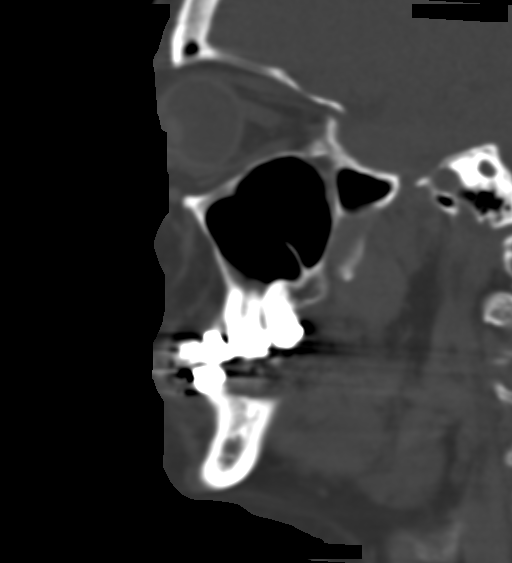

[15 of 47 positions shown; findings below may reference images not displayed]

FINDINGS: CT HEAD FINDINGS

Mild generalized cerebral and cerebellar volume loss and mild
chronic small-vessel white matter ischemic changes are noted.

No acute intracranial abnormalities are identified, including mass
lesion or mass effect, hydrocephalus, extra-axial fluid collection,
midline shift, hemorrhage, or acute infarction.

CT MAXILLOFACIAL FINDINGS

A right orbital floor fracture is identified (3-5 mm inferior
displacement) with some herniation of fat. The inferior orbital
musculature lies along the fracture site.

Right orbital and facial emphysema is identified.

The globes bilaterally retain their spherical shape. There is no
evidence of intraconal abnormality.

No other acute fracture, subluxation or dislocation identified.

A small amount of fluid/blood within the right maxillary sinus is
noted.

No other significant abnormalities are identified with the paranasal
sinuses, mastoid air cells or middle/inner ears.
IMPRESSION: Right orbital floor fracture as described with right orbital and
facial emphysema.

No evidence of acute intracranial abnormality. Atrophy and chronic
small-vessel white matter ischemic changes.

## 2017-03-01 DIAGNOSIS — R3914 Feeling of incomplete bladder emptying: Secondary | ICD-10-CM | POA: Diagnosis not present

## 2017-03-01 DIAGNOSIS — N3020 Other chronic cystitis without hematuria: Secondary | ICD-10-CM | POA: Diagnosis not present

## 2017-03-17 DIAGNOSIS — N3020 Other chronic cystitis without hematuria: Secondary | ICD-10-CM | POA: Diagnosis not present

## 2017-03-17 DIAGNOSIS — R3914 Feeling of incomplete bladder emptying: Secondary | ICD-10-CM | POA: Diagnosis not present

## 2017-03-17 DIAGNOSIS — N359 Urethral stricture, unspecified: Secondary | ICD-10-CM | POA: Diagnosis not present

## 2017-04-13 DIAGNOSIS — H401112 Primary open-angle glaucoma, right eye, moderate stage: Secondary | ICD-10-CM | POA: Diagnosis not present

## 2017-04-13 DIAGNOSIS — H40002 Preglaucoma, unspecified, left eye: Secondary | ICD-10-CM | POA: Diagnosis not present

## 2017-05-10 DIAGNOSIS — R350 Frequency of micturition: Secondary | ICD-10-CM | POA: Diagnosis not present

## 2017-05-10 DIAGNOSIS — R3914 Feeling of incomplete bladder emptying: Secondary | ICD-10-CM | POA: Diagnosis not present

## 2017-05-10 DIAGNOSIS — N3020 Other chronic cystitis without hematuria: Secondary | ICD-10-CM | POA: Diagnosis not present

## 2017-05-26 DIAGNOSIS — Z23 Encounter for immunization: Secondary | ICD-10-CM | POA: Diagnosis not present

## 2017-06-02 DIAGNOSIS — M1990 Unspecified osteoarthritis, unspecified site: Secondary | ICD-10-CM | POA: Diagnosis not present

## 2017-06-02 DIAGNOSIS — I10 Essential (primary) hypertension: Secondary | ICD-10-CM | POA: Diagnosis not present

## 2017-06-02 DIAGNOSIS — M810 Age-related osteoporosis without current pathological fracture: Secondary | ICD-10-CM | POA: Diagnosis not present

## 2017-06-02 DIAGNOSIS — E7849 Other hyperlipidemia: Secondary | ICD-10-CM | POA: Diagnosis not present

## 2017-06-02 DIAGNOSIS — Z1389 Encounter for screening for other disorder: Secondary | ICD-10-CM | POA: Diagnosis not present

## 2017-06-02 DIAGNOSIS — N318 Other neuromuscular dysfunction of bladder: Secondary | ICD-10-CM | POA: Diagnosis not present

## 2017-06-02 DIAGNOSIS — Z6829 Body mass index (BMI) 29.0-29.9, adult: Secondary | ICD-10-CM | POA: Diagnosis not present

## 2017-06-02 DIAGNOSIS — I872 Venous insufficiency (chronic) (peripheral): Secondary | ICD-10-CM | POA: Diagnosis not present

## 2017-07-12 DIAGNOSIS — N3020 Other chronic cystitis without hematuria: Secondary | ICD-10-CM | POA: Diagnosis not present

## 2017-07-12 DIAGNOSIS — R3914 Feeling of incomplete bladder emptying: Secondary | ICD-10-CM | POA: Diagnosis not present

## 2017-07-12 DIAGNOSIS — R351 Nocturia: Secondary | ICD-10-CM | POA: Diagnosis not present

## 2017-07-15 DIAGNOSIS — Z6829 Body mass index (BMI) 29.0-29.9, adult: Secondary | ICD-10-CM | POA: Diagnosis not present

## 2017-07-15 DIAGNOSIS — I10 Essential (primary) hypertension: Secondary | ICD-10-CM | POA: Diagnosis not present

## 2017-07-15 DIAGNOSIS — R600 Localized edema: Secondary | ICD-10-CM | POA: Diagnosis not present

## 2017-07-15 DIAGNOSIS — Z01818 Encounter for other preprocedural examination: Secondary | ICD-10-CM | POA: Diagnosis not present

## 2017-07-20 ENCOUNTER — Other Ambulatory Visit: Payer: Self-pay | Admitting: Urology

## 2017-07-29 ENCOUNTER — Encounter (HOSPITAL_BASED_OUTPATIENT_CLINIC_OR_DEPARTMENT_OTHER): Payer: Self-pay | Admitting: *Deleted

## 2017-07-29 ENCOUNTER — Other Ambulatory Visit: Payer: Self-pay

## 2017-07-29 NOTE — Progress Notes (Signed)
Npo after midnight arrive 800am 12-18 [facility] needs I stat 4 chest xray [facility] 07-15-17 on chart, ekg 07-15-17 [facility] on chart, medical clearance 07-12-17 dr Dagmar Hait on chart for 08-03-17 surgery, lov dr Dagmar Hait 07-15-17 on chart driver mary benton friend.

## 2017-08-02 NOTE — H&P (Signed)
CC/HPI: CC: Frequency.   Tara Bennett returns today in f/u. She has increased symptoms with urgency. She has nocturia q52mn. She has daytime frequency but it is not is severe. The stream has weakened and she has to strain to void. Her PVR is up to 1874m She has tried anticholinergics in the past but had retention. She has a urethral stricture that was last dilated in 7/16. She didn't tolerate office dilations. She has no associated signs or symptoms.   She remains on Estrace cream for atrophic vaginitis.        ALLERGIES: Detrol TABS Ditropan XL TBCR furosemide    MEDICATIONS: Aspirin 81 MG TABS 0 Oral  Calcium 600 + D TABS Oral  Estrace 0.01 % cream with applicator Vaginal  Glucosamine CAPS Oral  Multiple Vitamin TABS Oral  Olmesartan-Hydrochlorothiazide 20 mg-12.5 mg tablet  Simvastatin 20 mg tablet Oral  Tylenol TABS Oral  Vitamin D 1000 UNIT Oral Tablet 0 Oral  Voltaren 1 % gel Transdermal     GU PSH: Catheterization For Collection Of Specimen, Single Patient, All Places Of Service - 06/24/2016 Catheterize For Residual - 06/24/2016 Complex Uroflow - 03/17/2017, 05/20/2016 Cysto Dilate Stricture (M or F) - 2016-02-20202015/02/202012, 2012 Cystoscopy Fulguration - 20February 20, 2016  NON-GU PSH: None   GU PMH: Chronic cystitis (w/o hematuria) (Chronic), F/U post UDS for RUS and OV w/Dr.Wreen - 05/20/2016 Incomplete bladder emptying (Worsening, Chronic), Proceed with UDS and f/u w/Dr. WrJeffie Pollock 05/20/2016 Nocturia (Worsening, Chronic), Culture urine. PVR: 51 ml Will empirically begin Cephalexin 500 mg 1 po BID X 7 days till culture complete - 05/08/2016, Nocturia, - 202017-02-20ostmenopausal atrophic vaginitis, Atrophic vaginitis - 2020-Feb-2017rethral Stricture, Unspec, Urethral stricture - 202017-02-20rge incontinence, Urge incontinence of urine - 2002/20/2017rinary Retention, Unspec, Incomplete bladder emptying - 2002/20/2016Acute Urinary Retention, - 2002/20/14rinary Tract Inf, Unspec site, Pyuria - 20February 20, 2016Pyuria, - 202015/02/20training on  Urination, Initiating Urination Requires Straining - 20February 20, 2014    PMH Notes:  2010-07-09 13:58:46 - Note: Arthritis   NON-GU PMH: Encounter for general adult medical examination without abnormal findings, Encounter for preventive health examination - 2020-Feb-2015usion of labia, Labial fusion - 20Feb 20, 2014ersonal history of other diseases of the circulatory system, History of hypertension - 2002-20-2014ersonal history of other endocrine, nutritional and metabolic disease, History of hypercholesterolemia - 202014-02-20  FAMILY HISTORY: Death In The Family Father - Mother Death In The Family Mother - Mother Stroke Syndrome - Mother   SOCIAL HISTORY: Marital Status: Single Preferred Language: English; Race: White Current Smoking Status: Patient does not smoke anymore.  Does not drink caffeine.    REVIEW OF SYSTEMS:    GU Review Female:   Patient reports frequent urination, hard to postpone urination, and get up at night to urinate. Patient denies burning /pain with urination, leakage of urine, stream starts and stops, trouble starting your stream, have to strain to urinate, and being pregnant.  Gastrointestinal (Upper):   Patient denies nausea, vomiting, and indigestion/ heartburn.  Gastrointestinal (Lower):   Patient denies diarrhea and constipation.  Constitutional:   Patient denies weight loss, fatigue, fever, and night sweats.  Skin:   Patient denies skin rash/ lesion and itching.  Eyes:   Patient denies blurred vision and double vision.  Ears/ Nose/ Throat:   Patient denies sore throat and sinus problems.  Hematologic/Lymphatic:   Patient denies swollen glands and easy bruising.  Cardiovascular:   Patient reports leg swelling. Patient denies chest pains.  Respiratory:   Patient denies cough and shortness of breath.  Endocrine:   Patient denies excessive thirst.  Musculoskeletal:   Patient denies back pain and joint pain.  Neurological:   Patient denies headaches and dizziness.  Psychologic:   Patient  denies depression and anxiety.   VITAL SIGNS:      07/12/2017 11:34 AM  BP 135/76 mmHg  Heart Rate 82 /min   MULTI-SYSTEM PHYSICAL EXAMINATION:    Constitutional: Well-nourished. No physical deformities. Normally developed. Good grooming.  Respiratory: No labored breathing, no use of accessory muscles. CTA  Cardiovascular: Normal temperature, RRR without murmur, marked bilateral ankle edema left > right, no varicosities.      PAST DATA REVIEWED:  Source Of History:  Patient  Urine Test Review:   Urinalysis  Urodynamics Review:   Review Bladder Scan   PROCEDURES:           PVR Ultrasound - 19622  Scanned Volume: 188 cc         Catheter / SP Tube - 51701 In and Out Catheterization  A 14 French red rubber or straight catheter was inserted into the bladder using sterile technique. the attempt was difficult. A urinalysis was sent to the lab. A urine culture was sent to the lab. 160 cc of urine was obtained.         Urinalysis w/Scope Dipstick Dipstick Cont'd Micro  Color: Yellow Bilirubin: Neg WBC/hpf: 0 - 5/hpf  Appearance: Clear Ketones: Neg RBC/hpf: 3 - 10/hpf  Specific Gravity: 1.015 Blood: 2+ Bacteria: Few (10-25/hpf)  pH: 7.5 Protein: 1+ Cystals: NS (Not Seen)  Glucose: Neg Urobilinogen: 0.2 Casts: NS (Not Seen)    Nitrites: Neg Trichomonas: Not Present    Leukocyte Esterase: Neg Mucous: Not Present      Epithelial Cells: NS (Not Seen)      Yeast: NS (Not Seen)      Sperm: Not Present    ASSESSMENT:      ICD-10 Details  1 GU:   Urethral Stricture, Unspec - N35.9 Worsening - She has increased difficulty with voiding and a more elevated PVR. I will get her set up for outpatient urethral dilation under MAC. I have reviewed the risks of bleeding, infection, urethral injury and possible sedation risks.   2   Chronic cystitis (w/o hematuria) - N30.20 UA has moderate bacteria and a few RBC's but minimal WBC's. I will get a culture.   3   Incomplete bladder emptying - R39.14  Worsening - PVR is 124m  4   Nocturia - R35.1 Worsening   PLAN:           Orders Labs Urine Culture          Schedule Return Visit/Planned Activity: Next Available Appointment - Schedule Surgery

## 2017-08-03 ENCOUNTER — Encounter (HOSPITAL_BASED_OUTPATIENT_CLINIC_OR_DEPARTMENT_OTHER): Payer: Self-pay

## 2017-08-03 ENCOUNTER — Ambulatory Visit (HOSPITAL_BASED_OUTPATIENT_CLINIC_OR_DEPARTMENT_OTHER)
Admission: RE | Admit: 2017-08-03 | Discharge: 2017-08-03 | Disposition: A | Discharge status: Home / Self Care | Payer: Medicare Other | Source: Ambulatory Visit | Attending: Urology | Admitting: Urology

## 2017-08-03 ENCOUNTER — Encounter (HOSPITAL_BASED_OUTPATIENT_CLINIC_OR_DEPARTMENT_OTHER)
Admission: RE | Disposition: A | Discharge status: Home / Self Care | Payer: Self-pay | Source: Ambulatory Visit | Attending: Urology

## 2017-08-03 ENCOUNTER — Ambulatory Visit (HOSPITAL_BASED_OUTPATIENT_CLINIC_OR_DEPARTMENT_OTHER): Payer: Medicare Other | Admitting: Anesthesiology

## 2017-08-03 DIAGNOSIS — Z79890 Hormone replacement therapy (postmenopausal): Secondary | ICD-10-CM | POA: Diagnosis not present

## 2017-08-03 DIAGNOSIS — R3915 Urgency of urination: Secondary | ICD-10-CM | POA: Diagnosis present

## 2017-08-03 DIAGNOSIS — R351 Nocturia: Secondary | ICD-10-CM | POA: Diagnosis not present

## 2017-08-03 DIAGNOSIS — Z79899 Other long term (current) drug therapy: Secondary | ICD-10-CM | POA: Diagnosis not present

## 2017-08-03 DIAGNOSIS — R339 Retention of urine, unspecified: Secondary | ICD-10-CM | POA: Diagnosis not present

## 2017-08-03 DIAGNOSIS — N952 Postmenopausal atrophic vaginitis: Secondary | ICD-10-CM | POA: Insufficient documentation

## 2017-08-03 DIAGNOSIS — I10 Essential (primary) hypertension: Secondary | ICD-10-CM | POA: Diagnosis not present

## 2017-08-03 DIAGNOSIS — Z87891 Personal history of nicotine dependence: Secondary | ICD-10-CM | POA: Diagnosis not present

## 2017-08-03 DIAGNOSIS — N3020 Other chronic cystitis without hematuria: Secondary | ICD-10-CM | POA: Diagnosis not present

## 2017-08-03 DIAGNOSIS — N3592 Unspecified urethral stricture, female: Secondary | ICD-10-CM | POA: Diagnosis not present

## 2017-08-03 DIAGNOSIS — Z7982 Long term (current) use of aspirin: Secondary | ICD-10-CM | POA: Insufficient documentation

## 2017-08-03 DIAGNOSIS — N3090 Cystitis, unspecified without hematuria: Secondary | ICD-10-CM | POA: Diagnosis not present

## 2017-08-03 DIAGNOSIS — N3582 Other urethral stricture, female: Secondary | ICD-10-CM | POA: Diagnosis not present

## 2017-08-03 HISTORY — DX: Edema, unspecified: R60.9

## 2017-08-03 HISTORY — PX: CYSTOSCOPY WITH URETHRAL DILATATION: SHX5125

## 2017-08-03 LAB — POCT I-STAT 4, (NA,K, GLUC, HGB,HCT)
GLUCOSE: 109 mg/dL — AB (ref 65–99)
HCT: 34 % — ABNORMAL LOW (ref 36.0–46.0)
Hemoglobin: 11.6 g/dL — ABNORMAL LOW (ref 12.0–15.0)
POTASSIUM: 3.6 mmol/L (ref 3.5–5.1)
Sodium: 140 mmol/L (ref 135–145)

## 2017-08-03 SURGERY — CYSTOSCOPY, WITH URETHRAL DILATION
Anesthesia: Monitor Anesthesia Care | Site: Bladder

## 2017-08-03 MED ORDER — ONDANSETRON HCL 4 MG/2ML IJ SOLN
INTRAMUSCULAR | Status: DC | PRN
  Administered 2017-08-03: 4 mg via INTRAVENOUS

## 2017-08-03 MED ORDER — STERILE WATER FOR IRRIGATION IR SOLN
Status: DC | PRN
  Administered 2017-08-03: 3000 mL via INTRAVESICAL

## 2017-08-03 MED ORDER — SODIUM CHLORIDE 0.9 % IV SOLN
250.0000 mL | INTRAVENOUS | Status: DC | PRN
  Filled 2017-08-03: qty 250

## 2017-08-03 MED ORDER — LIDOCAINE 2% (20 MG/ML) 5 ML SYRINGE
INTRAMUSCULAR | Status: AC
  Filled 2017-08-03: qty 10

## 2017-08-03 MED ORDER — PROPOFOL 500 MG/50ML IV EMUL
INTRAVENOUS | Status: AC
  Filled 2017-08-03: qty 50

## 2017-08-03 MED ORDER — LACTATED RINGERS IV SOLN
INTRAVENOUS | Status: DC
  Administered 2017-08-03: 1000 mL via INTRAVENOUS
  Filled 2017-08-03: qty 1000

## 2017-08-03 MED ORDER — CEFAZOLIN SODIUM-DEXTROSE 2-4 GM/100ML-% IV SOLN
INTRAVENOUS | Status: AC
  Filled 2017-08-03: qty 100

## 2017-08-03 MED ORDER — CEFAZOLIN SODIUM-DEXTROSE 2-4 GM/100ML-% IV SOLN
2.0000 g | INTRAVENOUS | Status: AC
  Administered 2017-08-03: 2 g via INTRAVENOUS
  Filled 2017-08-03: qty 100

## 2017-08-03 MED ORDER — FENTANYL CITRATE (PF) 100 MCG/2ML IJ SOLN
INTRAMUSCULAR | Status: DC | PRN
  Administered 2017-08-03 (×3): 25 ug via INTRAVENOUS

## 2017-08-03 MED ORDER — PROPOFOL 500 MG/50ML IV EMUL
INTRAVENOUS | Status: DC | PRN
  Administered 2017-08-03: 200 ug/kg/min via INTRAVENOUS

## 2017-08-03 MED ORDER — OXYCODONE HCL 5 MG PO TABS
5.0000 mg | ORAL_TABLET | ORAL | Status: DC | PRN
  Filled 2017-08-03: qty 2

## 2017-08-03 MED ORDER — MORPHINE SULFATE (PF) 2 MG/ML IV SOLN
1.0000 mg | INTRAVENOUS | Status: DC | PRN
  Filled 2017-08-03: qty 0.5

## 2017-08-03 MED ORDER — CEPHALEXIN 250 MG PO CAPS: 250.0000 mg | ORAL_CAPSULE | Freq: Four times a day (QID) | ORAL | 0 refills | Status: AC

## 2017-08-03 MED ORDER — FENTANYL CITRATE (PF) 100 MCG/2ML IJ SOLN
INTRAMUSCULAR | Status: AC
  Filled 2017-08-03: qty 2

## 2017-08-03 MED ORDER — SODIUM CHLORIDE 0.9% FLUSH
3.0000 mL | Freq: Two times a day (BID) | INTRAVENOUS | Status: DC
  Filled 2017-08-03: qty 3

## 2017-08-03 MED ORDER — ACETAMINOPHEN 650 MG RE SUPP
650.0000 mg | RECTAL | Status: DC | PRN
  Filled 2017-08-03: qty 1

## 2017-08-03 MED ORDER — SODIUM CHLORIDE 0.9% FLUSH
3.0000 mL | INTRAVENOUS | Status: DC | PRN
  Filled 2017-08-03: qty 3

## 2017-08-03 MED ORDER — ACETAMINOPHEN 325 MG PO TABS
650.0000 mg | ORAL_TABLET | ORAL | Status: DC | PRN
  Filled 2017-08-03: qty 2

## 2017-08-03 MED ORDER — ONDANSETRON HCL 4 MG/2ML IJ SOLN
INTRAMUSCULAR | Status: AC
  Filled 2017-08-03: qty 2

## 2017-08-03 SURGICAL SUPPLY — 27 items
BAG DRAIN URO-CYSTO SKYTR STRL (DRAIN) ×3 IMPLANT
BAG DRN UROCATH (DRAIN) ×1
BALLN NEPHROSTOMY (BALLOONS) ×3
BALLOON NEPHROSTOMY (BALLOONS) ×1 IMPLANT
CATH FOLEY 2WAY SLVR  5CC 16FR (CATHETERS)
CATH FOLEY 2WAY SLVR 5CC 16FR (CATHETERS) IMPLANT
CLOTH BEACON ORANGE TIMEOUT ST (SAFETY) ×6 IMPLANT
ELECT REM PT RETURN 9FT ADLT (ELECTROSURGICAL)
ELECTRODE REM PT RTRN 9FT ADLT (ELECTROSURGICAL) IMPLANT
GLOVE SURG SS PI 8.0 STRL IVOR (GLOVE) ×3 IMPLANT
GOWN STRL REUS W/ TWL LRG LVL3 (GOWN DISPOSABLE) ×1 IMPLANT
GOWN STRL REUS W/ TWL XL LVL3 (GOWN DISPOSABLE) ×1 IMPLANT
GOWN STRL REUS W/TWL LRG LVL3 (GOWN DISPOSABLE) ×3
GOWN STRL REUS W/TWL XL LVL3 (GOWN DISPOSABLE) ×3
GUIDEWIRE STR DUAL SENSOR (WIRE) IMPLANT
KIT RM TURNOVER CYSTO AR (KITS) ×3 IMPLANT
MANIFOLD NEPTUNE II (INSTRUMENTS) ×3 IMPLANT
NDL SAFETY ECLIPSE 18X1.5 (NEEDLE) IMPLANT
NEEDLE HYPO 18GX1.5 SHARP (NEEDLE)
NEEDLE HYPO 22GX1.5 SAFETY (NEEDLE) IMPLANT
NS IRRIG 500ML POUR BTL (IV SOLUTION) IMPLANT
PACK CYSTO (CUSTOM PROCEDURE TRAY) ×3 IMPLANT
SYR 20CC LL (SYRINGE) IMPLANT
SYR 30ML LL (SYRINGE) IMPLANT
TUBE CONNECTING 12'X1/4 (SUCTIONS)
TUBE CONNECTING 12X1/4 (SUCTIONS) IMPLANT
WATER STERILE IRR 3000ML UROMA (IV SOLUTION) ×3 IMPLANT

## 2017-08-03 NOTE — Discharge Instructions (Addendum)
CYSTOSCOPY HOME CARE INSTRUCTIONS  Activity: Rest for the remainder of the day.  Do not drive or operate equipment today.  You may resume normal activities in one to two days as instructed by your physician.   Meals: Drink plenty of liquids and eat light foods such as gelatin or soup this evening.  You may return to a normal meal plan tomorrow.  Return to Work: You may return to work in one to two days or as instructed by your physician.  Special Instructions / Symptoms: Call your physician if any of these symptoms occur:   -persistent or heavy bleeding  -bleeding which continues after first few urination  -large blood clots that are difficult to pass  -urine stream diminishes or stops completely  -fever equal to or higher than 101 degrees Farenheit.  -cloudy urine with a strong, foul odor  -severe pain  Females should always wipe from front to back after elimination.  You may feel some burning pain when you urinate.  This should disappear with time.  Applying moist heat to the lower abdomen or a hot tub bath may help relieve the pain. \     Post Anesthesia Home Care Instructions  Activity: Get plenty of rest for the remainder of the day. A responsible individual must stay with you for 24 hours following the procedure.  For the next 24 hours, DO NOT: -Drive a car -Operate machinery -Drink alcoholic beverages -Take any medication unless instructed by your physician -Make any legal decisions or sign important papers.  Meals: Start with liquid foods such as gelatin or soup. Progress to regular foods as tolerated. Avoid greasy, spicy, heavy foods. If nausea and/or vomiting occur, drink only clear liquids until the nausea and/or vomiting subsides. Call your physician if vomiting continues.  Special Instructions/Symptoms: Your throat may feel dry or sore from the anesthesia or the breathing tube placed in your throat during surgery. If this causes discomfort, gargle with warm salt  water. The discomfort should disappear within 24 hours.  If you had a scopolamine patch placed behind your ear for the management of post- operative nausea and/or vomiting:  1. The medication in the patch is effective for 72 hours, after which it should be removed.  Wrap patch in a tissue and discard in the trash. Wash hands thoroughly with soap and water. 2. You may remove the patch earlier than 72 hours if you experience unpleasant side effects which may include dry mouth, dizziness or visual disturbances. 3. Avoid touching the patch. Wash your hands with soap and water after contact with the patch.     

## 2017-08-03 NOTE — Transfer of Care (Signed)
Immediate Anesthesia Transfer of Care Note  Patient: Tara Bennett  Procedure(s) Performed: CYSTOSCOPY WITH URETHRAL DILATATION (N/A Bladder)  Patient Location: PACU  Anesthesia Type:MAC  Level of Consciousness: awake, alert , oriented and patient cooperative  Airway & Oxygen Therapy: Patient Spontanous Breathing and Patient connected to nasal cannula oxygen  Post-op Assessment: Report given to RN and Post -op Vital signs reviewed and stable  Post vital signs: Reviewed and stable  Last Vitals:  Vitals:   08/03/17 0755 08/03/17 1012  BP: (!) 131/59   Pulse: 84   Resp: 16   Temp: 36.7 C   SpO2: 99% (P) 96%    Last Pain:  Vitals:   08/03/17 0755  TempSrc: Oral      Patients Stated Pain Goal: 3 (94/85/46 2703)  Complications: No apparent anesthesia complications

## 2017-08-03 NOTE — Interval H&P Note (Signed)
History and Physical Interval Note:  08/03/2017 8:31 AM  Tara Bennett  has presented today for surgery, with the diagnosis of URETHRAL STRICTURE  The various methods of treatment have been discussed with the patient and family. After consideration of risks, benefits and other options for treatment, the patient has consented to  Procedure(s): CYSTOSCOPY WITH URETHRAL DILATATION (N/A) as a surgical intervention .  The patient's history has been reviewed, patient examined, no change in status, stable for surgery.  I have reviewed the patient's chart and labs.  Questions were answered to the patient's satisfaction.     Irine Seal

## 2017-08-03 NOTE — Op Note (Signed)
Procedure: Cystoscopy with urethral dilation and bladder biopsy with fulguration.  Preop diagnosis: Urethral stricture with incomplete emptying.  Postop diagnosis: Same with bladder mucosal abnormality.  Surgeon: Dr. Irine Seal.  Anesthesia: MAC.  Specimen: Bladder biopsy.  Drain: None.  EBL: None.  Complication: None.  Indication: Mrs Tara Bennett is an 83 year old white female with a history of urethral stricture and incomplete emptying he has had progressive voiding symptoms and it was felt that repeat dilation was indicated.  Procedure: She was taken the operating room where she was given Ancef.  She was placed in the lithotomy position and fitted with PAS hose.  She was given sedation as needed for the procedure.  Her perineum and genitalia were prepped with Betadine solution and she was draped in usual sterile fashion.  An initial attempt at cystoscopy with a 35 French scope was unsuccessful due to meatal stenosis/stricture.  She was then dilated with female sounds from 18-34 [place].  There was some urethral bleeding post dilation.  Cystoscopy was then performed with the 30 degree lens.  examination revealed disruption of the urethral stricture.  The bladder wall had severe trabeculation with multiple cellules and diverticuli.  There was erythematous mucosa primarily on the lateral and posterior walls that appeared most consistent with an inflammatory process but carcinoma in situ cannot be ruled out on visual inspection.  A cup biopsy forceps was used to biopsy 1 of the areas of abnormality.  The biopsy site was then fulgurated with a Bugbee electrode.  Once hemostasis was achieved the bladder was drained.  She was taken down from lithotomy position and she was taken to the recovery room in stable condition.  There were no complications.

## 2017-08-03 NOTE — Anesthesia Preprocedure Evaluation (Addendum)
Anesthesia Evaluation  Patient identified by MRN, date of birth, ID band Patient awake    Reviewed: Allergy & Precautions, H&P , NPO status , Patient's Chart, lab work & pertinent test results, reviewed documented beta blocker date and time   Airway Mallampati: II  TM Distance: >3 FB Neck ROM: full    Dental no notable dental hx.    Pulmonary former smoker,    Pulmonary exam normal breath sounds clear to auscultation       Cardiovascular hypertension,  Rhythm:regular Rate:Normal     Neuro/Psych    GI/Hepatic   Endo/Other  obese  Renal/GU      Musculoskeletal  (+) Arthritis ,   Abdominal   Peds  Hematology   Anesthesia Other Findings   Reproductive/Obstetrics                            Anesthesia Physical  Anesthesia Plan  ASA: II  Anesthesia Plan: MAC   Post-op Pain Management:    Induction: Intravenous  PONV Risk Score and Plan:   Airway Management Planned: Mask and Natural Airway  Additional Equipment:   Intra-op Plan:   Post-operative Plan:   Informed Consent: I have reviewed the patients History and Physical, chart, labs and discussed the procedure including the risks, benefits and alternatives for the proposed anesthesia with the patient or authorized representative who has indicated his/her understanding and acceptance.   Dental Advisory Given  Plan Discussed with: CRNA and Surgeon  Anesthesia Plan Comments:        Anesthesia Quick Evaluation

## 2017-08-03 NOTE — Anesthesia Postprocedure Evaluation (Signed)
Anesthesia Post Note  Patient: Tara Bennett  Procedure(s) Performed: CYSTOSCOPY WITH URETHRAL DILATATION (N/A Bladder)     Patient location during evaluation: PACU Anesthesia Type: MAC Level of consciousness: awake and alert Pain management: pain level controlled Vital Signs Assessment: post-procedure vital signs reviewed and stable Respiratory status: spontaneous breathing, nonlabored ventilation, respiratory function stable and patient connected to nasal cannula oxygen Cardiovascular status: stable and blood pressure returned to baseline Postop Assessment: no apparent nausea or vomiting Anesthetic complications: no    Last Vitals:  Vitals:   08/03/17 1045 08/03/17 1125  BP: (!) 137/57 (!) 164/68  Pulse: (!) 50 65  Resp: (!) 9 14  Temp:  36.5 C  SpO2: 100% 100%    Last Pain:  Vitals:   08/03/17 0755  TempSrc: Oral                 [name] EDWARD

## 2017-08-04 ENCOUNTER — Encounter (HOSPITAL_BASED_OUTPATIENT_CLINIC_OR_DEPARTMENT_OTHER): Payer: Self-pay | Admitting: Urology

## 2017-08-19 DIAGNOSIS — N3020 Other chronic cystitis without hematuria: Secondary | ICD-10-CM | POA: Diagnosis not present

## 2017-08-19 DIAGNOSIS — R3914 Feeling of incomplete bladder emptying: Secondary | ICD-10-CM | POA: Diagnosis not present

## 2017-08-19 DIAGNOSIS — R351 Nocturia: Secondary | ICD-10-CM | POA: Diagnosis not present

## 2017-09-07 DIAGNOSIS — H401131 Primary open-angle glaucoma, bilateral, mild stage: Secondary | ICD-10-CM | POA: Diagnosis not present

## 2017-11-22 DIAGNOSIS — R351 Nocturia: Secondary | ICD-10-CM | POA: Diagnosis not present

## 2017-11-22 DIAGNOSIS — R3121 Asymptomatic microscopic hematuria: Secondary | ICD-10-CM | POA: Diagnosis not present

## 2017-11-22 DIAGNOSIS — R3914 Feeling of incomplete bladder emptying: Secondary | ICD-10-CM | POA: Diagnosis not present

## 2017-12-13 DIAGNOSIS — M1990 Unspecified osteoarthritis, unspecified site: Secondary | ICD-10-CM | POA: Diagnosis not present

## 2017-12-13 DIAGNOSIS — J302 Other seasonal allergic rhinitis: Secondary | ICD-10-CM | POA: Diagnosis not present

## 2017-12-13 DIAGNOSIS — Z1389 Encounter for screening for other disorder: Secondary | ICD-10-CM | POA: Diagnosis not present

## 2017-12-13 DIAGNOSIS — N318 Other neuromuscular dysfunction of bladder: Secondary | ICD-10-CM | POA: Diagnosis not present

## 2017-12-13 DIAGNOSIS — E7849 Other hyperlipidemia: Secondary | ICD-10-CM | POA: Diagnosis not present

## 2017-12-13 DIAGNOSIS — Z6829 Body mass index (BMI) 29.0-29.9, adult: Secondary | ICD-10-CM | POA: Diagnosis not present

## 2017-12-13 DIAGNOSIS — I10 Essential (primary) hypertension: Secondary | ICD-10-CM | POA: Diagnosis not present

## 2017-12-13 DIAGNOSIS — M810 Age-related osteoporosis without current pathological fracture: Secondary | ICD-10-CM | POA: Diagnosis not present

## 2017-12-13 DIAGNOSIS — I872 Venous insufficiency (chronic) (peripheral): Secondary | ICD-10-CM | POA: Diagnosis not present

## 2018-01-14 DIAGNOSIS — I10 Essential (primary) hypertension: Secondary | ICD-10-CM | POA: Diagnosis not present

## 2018-01-14 DIAGNOSIS — I872 Venous insufficiency (chronic) (peripheral): Secondary | ICD-10-CM | POA: Diagnosis not present

## 2018-01-14 DIAGNOSIS — Z6829 Body mass index (BMI) 29.0-29.9, adult: Secondary | ICD-10-CM | POA: Diagnosis not present

## 2018-02-03 DIAGNOSIS — R600 Localized edema: Secondary | ICD-10-CM | POA: Diagnosis not present

## 2018-02-03 DIAGNOSIS — I83893 Varicose veins of bilateral lower extremities with other complications: Secondary | ICD-10-CM | POA: Diagnosis not present

## 2018-02-09 DIAGNOSIS — L97211 Non-pressure chronic ulcer of right calf limited to breakdown of skin: Secondary | ICD-10-CM | POA: Diagnosis not present

## 2018-02-09 DIAGNOSIS — L97221 Non-pressure chronic ulcer of left calf limited to breakdown of skin: Secondary | ICD-10-CM | POA: Diagnosis not present

## 2018-02-16 DIAGNOSIS — R600 Localized edema: Secondary | ICD-10-CM | POA: Diagnosis not present

## 2018-02-16 DIAGNOSIS — I83893 Varicose veins of bilateral lower extremities with other complications: Secondary | ICD-10-CM | POA: Diagnosis not present

## 2018-02-21 DIAGNOSIS — R351 Nocturia: Secondary | ICD-10-CM | POA: Diagnosis not present

## 2018-02-21 DIAGNOSIS — R3914 Feeling of incomplete bladder emptying: Secondary | ICD-10-CM | POA: Diagnosis not present

## 2018-02-21 DIAGNOSIS — N3020 Other chronic cystitis without hematuria: Secondary | ICD-10-CM | POA: Diagnosis not present

## 2018-02-21 DIAGNOSIS — R8279 Other abnormal findings on microbiological examination of urine: Secondary | ICD-10-CM | POA: Diagnosis not present

## 2018-02-24 DIAGNOSIS — H401131 Primary open-angle glaucoma, bilateral, mild stage: Secondary | ICD-10-CM | POA: Diagnosis not present

## 2018-02-28 DIAGNOSIS — I8311 Varicose veins of right lower extremity with inflammation: Secondary | ICD-10-CM | POA: Diagnosis not present

## 2018-03-10 DIAGNOSIS — I83893 Varicose veins of bilateral lower extremities with other complications: Secondary | ICD-10-CM | POA: Diagnosis not present

## 2018-03-10 DIAGNOSIS — I890 Lymphedema, not elsewhere classified: Secondary | ICD-10-CM | POA: Diagnosis not present

## 2018-03-18 DIAGNOSIS — M2550 Pain in unspecified joint: Secondary | ICD-10-CM | POA: Diagnosis not present

## 2018-03-18 DIAGNOSIS — M1380 Other specified arthritis, unspecified site: Secondary | ICD-10-CM | POA: Diagnosis not present

## 2018-03-18 DIAGNOSIS — M6281 Muscle weakness (generalized): Secondary | ICD-10-CM | POA: Diagnosis not present

## 2018-03-18 DIAGNOSIS — Z7389 Other problems related to life management difficulty: Secondary | ICD-10-CM | POA: Diagnosis not present

## 2018-03-21 DIAGNOSIS — M6281 Muscle weakness (generalized): Secondary | ICD-10-CM | POA: Diagnosis not present

## 2018-03-21 DIAGNOSIS — M2550 Pain in unspecified joint: Secondary | ICD-10-CM | POA: Diagnosis not present

## 2018-03-21 DIAGNOSIS — Z7389 Other problems related to life management difficulty: Secondary | ICD-10-CM | POA: Diagnosis not present

## 2018-03-21 DIAGNOSIS — M1380 Other specified arthritis, unspecified site: Secondary | ICD-10-CM | POA: Diagnosis not present

## 2018-03-24 DIAGNOSIS — M6281 Muscle weakness (generalized): Secondary | ICD-10-CM | POA: Diagnosis not present

## 2018-03-24 DIAGNOSIS — Z7389 Other problems related to life management difficulty: Secondary | ICD-10-CM | POA: Diagnosis not present

## 2018-03-24 DIAGNOSIS — M1380 Other specified arthritis, unspecified site: Secondary | ICD-10-CM | POA: Diagnosis not present

## 2018-03-24 DIAGNOSIS — M2550 Pain in unspecified joint: Secondary | ICD-10-CM | POA: Diagnosis not present

## 2018-04-27 DIAGNOSIS — N952 Postmenopausal atrophic vaginitis: Secondary | ICD-10-CM | POA: Diagnosis not present

## 2018-04-27 DIAGNOSIS — N3020 Other chronic cystitis without hematuria: Secondary | ICD-10-CM | POA: Diagnosis not present

## 2018-04-27 DIAGNOSIS — R351 Nocturia: Secondary | ICD-10-CM | POA: Diagnosis not present

## 2018-05-19 DIAGNOSIS — Z23 Encounter for immunization: Secondary | ICD-10-CM | POA: Diagnosis not present

## 2018-06-07 DIAGNOSIS — M1990 Unspecified osteoarthritis, unspecified site: Secondary | ICD-10-CM | POA: Diagnosis not present

## 2018-06-07 DIAGNOSIS — Z1389 Encounter for screening for other disorder: Secondary | ICD-10-CM | POA: Diagnosis not present

## 2018-06-07 DIAGNOSIS — I872 Venous insufficiency (chronic) (peripheral): Secondary | ICD-10-CM | POA: Diagnosis not present

## 2018-06-07 DIAGNOSIS — N318 Other neuromuscular dysfunction of bladder: Secondary | ICD-10-CM | POA: Diagnosis not present

## 2018-06-07 DIAGNOSIS — K429 Umbilical hernia without obstruction or gangrene: Secondary | ICD-10-CM | POA: Diagnosis not present

## 2018-06-07 DIAGNOSIS — E7849 Other hyperlipidemia: Secondary | ICD-10-CM | POA: Diagnosis not present

## 2018-06-07 DIAGNOSIS — Z6828 Body mass index (BMI) 28.0-28.9, adult: Secondary | ICD-10-CM | POA: Diagnosis not present

## 2018-06-07 DIAGNOSIS — M810 Age-related osteoporosis without current pathological fracture: Secondary | ICD-10-CM | POA: Diagnosis not present

## 2018-06-07 DIAGNOSIS — I10 Essential (primary) hypertension: Secondary | ICD-10-CM | POA: Diagnosis not present

## 2018-06-07 DIAGNOSIS — R600 Localized edema: Secondary | ICD-10-CM | POA: Diagnosis not present

## 2018-06-22 DIAGNOSIS — I998 Other disorder of circulatory system: Secondary | ICD-10-CM | POA: Diagnosis not present

## 2018-06-22 DIAGNOSIS — M6281 Muscle weakness (generalized): Secondary | ICD-10-CM | POA: Diagnosis not present

## 2018-06-22 DIAGNOSIS — R2681 Unsteadiness on feet: Secondary | ICD-10-CM | POA: Diagnosis not present

## 2018-06-22 DIAGNOSIS — M25562 Pain in left knee: Secondary | ICD-10-CM | POA: Diagnosis not present

## 2018-06-23 DIAGNOSIS — R2681 Unsteadiness on feet: Secondary | ICD-10-CM | POA: Diagnosis not present

## 2018-06-23 DIAGNOSIS — I998 Other disorder of circulatory system: Secondary | ICD-10-CM | POA: Diagnosis not present

## 2018-06-23 DIAGNOSIS — M6281 Muscle weakness (generalized): Secondary | ICD-10-CM | POA: Diagnosis not present

## 2018-06-23 DIAGNOSIS — M25562 Pain in left knee: Secondary | ICD-10-CM | POA: Diagnosis not present

## 2018-06-24 DIAGNOSIS — I998 Other disorder of circulatory system: Secondary | ICD-10-CM | POA: Diagnosis not present

## 2018-06-24 DIAGNOSIS — R2681 Unsteadiness on feet: Secondary | ICD-10-CM | POA: Diagnosis not present

## 2018-06-24 DIAGNOSIS — M6281 Muscle weakness (generalized): Secondary | ICD-10-CM | POA: Diagnosis not present

## 2018-06-24 DIAGNOSIS — M25562 Pain in left knee: Secondary | ICD-10-CM | POA: Diagnosis not present

## 2018-06-27 DIAGNOSIS — M6281 Muscle weakness (generalized): Secondary | ICD-10-CM | POA: Diagnosis not present

## 2018-06-27 DIAGNOSIS — I998 Other disorder of circulatory system: Secondary | ICD-10-CM | POA: Diagnosis not present

## 2018-06-27 DIAGNOSIS — R2681 Unsteadiness on feet: Secondary | ICD-10-CM | POA: Diagnosis not present

## 2018-06-27 DIAGNOSIS — M25562 Pain in left knee: Secondary | ICD-10-CM | POA: Diagnosis not present

## 2018-06-29 DIAGNOSIS — I998 Other disorder of circulatory system: Secondary | ICD-10-CM | POA: Diagnosis not present

## 2018-06-29 DIAGNOSIS — M25562 Pain in left knee: Secondary | ICD-10-CM | POA: Diagnosis not present

## 2018-06-29 DIAGNOSIS — R2681 Unsteadiness on feet: Secondary | ICD-10-CM | POA: Diagnosis not present

## 2018-06-29 DIAGNOSIS — M6281 Muscle weakness (generalized): Secondary | ICD-10-CM | POA: Diagnosis not present

## 2018-06-30 DIAGNOSIS — M6281 Muscle weakness (generalized): Secondary | ICD-10-CM | POA: Diagnosis not present

## 2018-06-30 DIAGNOSIS — M25562 Pain in left knee: Secondary | ICD-10-CM | POA: Diagnosis not present

## 2018-06-30 DIAGNOSIS — R2681 Unsteadiness on feet: Secondary | ICD-10-CM | POA: Diagnosis not present

## 2018-06-30 DIAGNOSIS — I998 Other disorder of circulatory system: Secondary | ICD-10-CM | POA: Diagnosis not present

## 2018-07-04 DIAGNOSIS — I998 Other disorder of circulatory system: Secondary | ICD-10-CM | POA: Diagnosis not present

## 2018-07-04 DIAGNOSIS — M25562 Pain in left knee: Secondary | ICD-10-CM | POA: Diagnosis not present

## 2018-07-04 DIAGNOSIS — R2681 Unsteadiness on feet: Secondary | ICD-10-CM | POA: Diagnosis not present

## 2018-07-04 DIAGNOSIS — M6281 Muscle weakness (generalized): Secondary | ICD-10-CM | POA: Diagnosis not present

## 2018-07-05 DIAGNOSIS — R2681 Unsteadiness on feet: Secondary | ICD-10-CM | POA: Diagnosis not present

## 2018-07-05 DIAGNOSIS — I998 Other disorder of circulatory system: Secondary | ICD-10-CM | POA: Diagnosis not present

## 2018-07-05 DIAGNOSIS — M6281 Muscle weakness (generalized): Secondary | ICD-10-CM | POA: Diagnosis not present

## 2018-07-05 DIAGNOSIS — M25562 Pain in left knee: Secondary | ICD-10-CM | POA: Diagnosis not present

## 2018-07-07 DIAGNOSIS — I998 Other disorder of circulatory system: Secondary | ICD-10-CM | POA: Diagnosis not present

## 2018-07-07 DIAGNOSIS — M25562 Pain in left knee: Secondary | ICD-10-CM | POA: Diagnosis not present

## 2018-07-07 DIAGNOSIS — I890 Lymphedema, not elsewhere classified: Secondary | ICD-10-CM | POA: Diagnosis not present

## 2018-07-07 DIAGNOSIS — M6281 Muscle weakness (generalized): Secondary | ICD-10-CM | POA: Diagnosis not present

## 2018-07-07 DIAGNOSIS — R2681 Unsteadiness on feet: Secondary | ICD-10-CM | POA: Diagnosis not present

## 2018-07-07 DIAGNOSIS — I83893 Varicose veins of bilateral lower extremities with other complications: Secondary | ICD-10-CM | POA: Diagnosis not present

## 2018-07-11 DIAGNOSIS — I998 Other disorder of circulatory system: Secondary | ICD-10-CM | POA: Diagnosis not present

## 2018-07-11 DIAGNOSIS — M6281 Muscle weakness (generalized): Secondary | ICD-10-CM | POA: Diagnosis not present

## 2018-07-11 DIAGNOSIS — R2681 Unsteadiness on feet: Secondary | ICD-10-CM | POA: Diagnosis not present

## 2018-07-11 DIAGNOSIS — M25562 Pain in left knee: Secondary | ICD-10-CM | POA: Diagnosis not present

## 2018-07-12 DIAGNOSIS — H401131 Primary open-angle glaucoma, bilateral, mild stage: Secondary | ICD-10-CM | POA: Diagnosis not present

## 2018-07-13 DIAGNOSIS — R2681 Unsteadiness on feet: Secondary | ICD-10-CM | POA: Diagnosis not present

## 2018-07-13 DIAGNOSIS — M25562 Pain in left knee: Secondary | ICD-10-CM | POA: Diagnosis not present

## 2018-07-13 DIAGNOSIS — I998 Other disorder of circulatory system: Secondary | ICD-10-CM | POA: Diagnosis not present

## 2018-07-13 DIAGNOSIS — M6281 Muscle weakness (generalized): Secondary | ICD-10-CM | POA: Diagnosis not present

## 2018-07-18 DIAGNOSIS — M6281 Muscle weakness (generalized): Secondary | ICD-10-CM | POA: Diagnosis not present

## 2018-07-18 DIAGNOSIS — M25562 Pain in left knee: Secondary | ICD-10-CM | POA: Diagnosis not present

## 2018-07-18 DIAGNOSIS — I998 Other disorder of circulatory system: Secondary | ICD-10-CM | POA: Diagnosis not present

## 2018-07-18 DIAGNOSIS — R2681 Unsteadiness on feet: Secondary | ICD-10-CM | POA: Diagnosis not present

## 2018-07-19 DIAGNOSIS — M6281 Muscle weakness (generalized): Secondary | ICD-10-CM | POA: Diagnosis not present

## 2018-07-19 DIAGNOSIS — R2681 Unsteadiness on feet: Secondary | ICD-10-CM | POA: Diagnosis not present

## 2018-07-19 DIAGNOSIS — I998 Other disorder of circulatory system: Secondary | ICD-10-CM | POA: Diagnosis not present

## 2018-07-19 DIAGNOSIS — M25562 Pain in left knee: Secondary | ICD-10-CM | POA: Diagnosis not present

## 2018-07-21 DIAGNOSIS — M25562 Pain in left knee: Secondary | ICD-10-CM | POA: Diagnosis not present

## 2018-07-21 DIAGNOSIS — I998 Other disorder of circulatory system: Secondary | ICD-10-CM | POA: Diagnosis not present

## 2018-07-21 DIAGNOSIS — M6281 Muscle weakness (generalized): Secondary | ICD-10-CM | POA: Diagnosis not present

## 2018-07-21 DIAGNOSIS — R2681 Unsteadiness on feet: Secondary | ICD-10-CM | POA: Diagnosis not present

## 2018-07-27 DIAGNOSIS — M25562 Pain in left knee: Secondary | ICD-10-CM | POA: Diagnosis not present

## 2018-07-27 DIAGNOSIS — M6281 Muscle weakness (generalized): Secondary | ICD-10-CM | POA: Diagnosis not present

## 2018-07-27 DIAGNOSIS — I998 Other disorder of circulatory system: Secondary | ICD-10-CM | POA: Diagnosis not present

## 2018-07-27 DIAGNOSIS — R2681 Unsteadiness on feet: Secondary | ICD-10-CM | POA: Diagnosis not present

## 2018-08-01 DIAGNOSIS — R2681 Unsteadiness on feet: Secondary | ICD-10-CM | POA: Diagnosis not present

## 2018-08-01 DIAGNOSIS — M25562 Pain in left knee: Secondary | ICD-10-CM | POA: Diagnosis not present

## 2018-08-01 DIAGNOSIS — I998 Other disorder of circulatory system: Secondary | ICD-10-CM | POA: Diagnosis not present

## 2018-08-01 DIAGNOSIS — M6281 Muscle weakness (generalized): Secondary | ICD-10-CM | POA: Diagnosis not present

## 2018-08-03 DIAGNOSIS — M25562 Pain in left knee: Secondary | ICD-10-CM | POA: Diagnosis not present

## 2018-08-03 DIAGNOSIS — R2681 Unsteadiness on feet: Secondary | ICD-10-CM | POA: Diagnosis not present

## 2018-08-03 DIAGNOSIS — I998 Other disorder of circulatory system: Secondary | ICD-10-CM | POA: Diagnosis not present

## 2018-08-03 DIAGNOSIS — M6281 Muscle weakness (generalized): Secondary | ICD-10-CM | POA: Diagnosis not present

## 2018-08-04 DIAGNOSIS — M25562 Pain in left knee: Secondary | ICD-10-CM | POA: Diagnosis not present

## 2018-08-04 DIAGNOSIS — I998 Other disorder of circulatory system: Secondary | ICD-10-CM | POA: Diagnosis not present

## 2018-08-04 DIAGNOSIS — R2681 Unsteadiness on feet: Secondary | ICD-10-CM | POA: Diagnosis not present

## 2018-08-04 DIAGNOSIS — M6281 Muscle weakness (generalized): Secondary | ICD-10-CM | POA: Diagnosis not present

## 2018-12-14 DIAGNOSIS — E785 Hyperlipidemia, unspecified: Secondary | ICD-10-CM | POA: Diagnosis not present

## 2018-12-14 DIAGNOSIS — M810 Age-related osteoporosis without current pathological fracture: Secondary | ICD-10-CM | POA: Diagnosis not present

## 2018-12-14 DIAGNOSIS — J302 Other seasonal allergic rhinitis: Secondary | ICD-10-CM | POA: Diagnosis not present

## 2018-12-14 DIAGNOSIS — I872 Venous insufficiency (chronic) (peripheral): Secondary | ICD-10-CM | POA: Diagnosis not present

## 2018-12-14 DIAGNOSIS — M1990 Unspecified osteoarthritis, unspecified site: Secondary | ICD-10-CM | POA: Diagnosis not present

## 2018-12-14 DIAGNOSIS — K429 Umbilical hernia without obstruction or gangrene: Secondary | ICD-10-CM | POA: Diagnosis not present

## 2018-12-14 DIAGNOSIS — I10 Essential (primary) hypertension: Secondary | ICD-10-CM | POA: Diagnosis not present

## 2018-12-14 DIAGNOSIS — N318 Other neuromuscular dysfunction of bladder: Secondary | ICD-10-CM | POA: Diagnosis not present

## 2018-12-29 DIAGNOSIS — I890 Lymphedema, not elsewhere classified: Secondary | ICD-10-CM | POA: Diagnosis not present

## 2018-12-29 DIAGNOSIS — I83893 Varicose veins of bilateral lower extremities with other complications: Secondary | ICD-10-CM | POA: Diagnosis not present

## 2019-01-10 DIAGNOSIS — H401131 Primary open-angle glaucoma, bilateral, mild stage: Secondary | ICD-10-CM | POA: Diagnosis not present

## 2019-01-24 DIAGNOSIS — M216X1 Other acquired deformities of right foot: Secondary | ICD-10-CM | POA: Diagnosis not present

## 2019-01-24 DIAGNOSIS — L84 Corns and callosities: Secondary | ICD-10-CM | POA: Diagnosis not present

## 2019-02-09 DIAGNOSIS — I10 Essential (primary) hypertension: Secondary | ICD-10-CM | POA: Diagnosis not present

## 2019-02-09 DIAGNOSIS — K429 Umbilical hernia without obstruction or gangrene: Secondary | ICD-10-CM | POA: Diagnosis not present

## 2019-02-09 DIAGNOSIS — M1710 Unilateral primary osteoarthritis, unspecified knee: Secondary | ICD-10-CM | POA: Diagnosis not present

## 2019-02-13 DIAGNOSIS — N3941 Urge incontinence: Secondary | ICD-10-CM | POA: Diagnosis not present

## 2019-02-13 DIAGNOSIS — N952 Postmenopausal atrophic vaginitis: Secondary | ICD-10-CM | POA: Diagnosis not present

## 2019-03-01 DIAGNOSIS — M25562 Pain in left knee: Secondary | ICD-10-CM | POA: Diagnosis not present

## 2019-03-01 DIAGNOSIS — M1712 Unilateral primary osteoarthritis, left knee: Secondary | ICD-10-CM | POA: Diagnosis not present

## 2019-06-12 DIAGNOSIS — M810 Age-related osteoporosis without current pathological fracture: Secondary | ICD-10-CM | POA: Diagnosis not present

## 2019-06-12 DIAGNOSIS — I10 Essential (primary) hypertension: Secondary | ICD-10-CM | POA: Diagnosis not present

## 2019-06-12 DIAGNOSIS — J302 Other seasonal allergic rhinitis: Secondary | ICD-10-CM | POA: Diagnosis not present

## 2019-06-12 DIAGNOSIS — M1990 Unspecified osteoarthritis, unspecified site: Secondary | ICD-10-CM | POA: Diagnosis not present

## 2019-06-12 DIAGNOSIS — N318 Other neuromuscular dysfunction of bladder: Secondary | ICD-10-CM | POA: Diagnosis not present

## 2019-06-12 DIAGNOSIS — Z0000 Encounter for general adult medical examination without abnormal findings: Secondary | ICD-10-CM | POA: Diagnosis not present

## 2019-06-12 DIAGNOSIS — K429 Umbilical hernia without obstruction or gangrene: Secondary | ICD-10-CM | POA: Diagnosis not present

## 2019-06-12 DIAGNOSIS — E7849 Other hyperlipidemia: Secondary | ICD-10-CM | POA: Diagnosis not present

## 2019-06-12 DIAGNOSIS — I872 Venous insufficiency (chronic) (peripheral): Secondary | ICD-10-CM | POA: Diagnosis not present

## 2019-06-27 DIAGNOSIS — R600 Localized edema: Secondary | ICD-10-CM | POA: Diagnosis not present

## 2019-06-27 DIAGNOSIS — I83893 Varicose veins of bilateral lower extremities with other complications: Secondary | ICD-10-CM | POA: Diagnosis not present

## 2019-06-27 DIAGNOSIS — I8312 Varicose veins of left lower extremity with inflammation: Secondary | ICD-10-CM | POA: Diagnosis not present

## 2019-06-27 DIAGNOSIS — I8311 Varicose veins of right lower extremity with inflammation: Secondary | ICD-10-CM | POA: Diagnosis not present

## 2019-07-17 DIAGNOSIS — H401131 Primary open-angle glaucoma, bilateral, mild stage: Secondary | ICD-10-CM | POA: Diagnosis not present

## 2019-07-19 DIAGNOSIS — M25562 Pain in left knee: Secondary | ICD-10-CM | POA: Diagnosis not present

## 2019-07-19 DIAGNOSIS — M1712 Unilateral primary osteoarthritis, left knee: Secondary | ICD-10-CM | POA: Diagnosis not present

## 2019-08-21 DIAGNOSIS — Z23 Encounter for immunization: Secondary | ICD-10-CM | POA: Diagnosis not present

## 2019-09-18 DIAGNOSIS — Z23 Encounter for immunization: Secondary | ICD-10-CM | POA: Diagnosis not present

## 2019-11-20 DIAGNOSIS — H401131 Primary open-angle glaucoma, bilateral, mild stage: Secondary | ICD-10-CM | POA: Diagnosis not present

## 2019-11-22 DIAGNOSIS — M1712 Unilateral primary osteoarthritis, left knee: Secondary | ICD-10-CM | POA: Diagnosis not present

## 2019-11-22 DIAGNOSIS — M25562 Pain in left knee: Secondary | ICD-10-CM | POA: Diagnosis not present

## 2019-11-28 DIAGNOSIS — I83893 Varicose veins of bilateral lower extremities with other complications: Secondary | ICD-10-CM | POA: Diagnosis not present

## 2019-11-28 DIAGNOSIS — I890 Lymphedema, not elsewhere classified: Secondary | ICD-10-CM | POA: Diagnosis not present

## 2019-12-04 DIAGNOSIS — N318 Other neuromuscular dysfunction of bladder: Secondary | ICD-10-CM | POA: Diagnosis not present

## 2019-12-04 DIAGNOSIS — Z1331 Encounter for screening for depression: Secondary | ICD-10-CM | POA: Diagnosis not present

## 2019-12-04 DIAGNOSIS — I10 Essential (primary) hypertension: Secondary | ICD-10-CM | POA: Diagnosis not present

## 2019-12-04 DIAGNOSIS — I872 Venous insufficiency (chronic) (peripheral): Secondary | ICD-10-CM | POA: Diagnosis not present

## 2019-12-04 DIAGNOSIS — K429 Umbilical hernia without obstruction or gangrene: Secondary | ICD-10-CM | POA: Diagnosis not present

## 2019-12-04 DIAGNOSIS — M1990 Unspecified osteoarthritis, unspecified site: Secondary | ICD-10-CM | POA: Diagnosis not present

## 2019-12-04 DIAGNOSIS — J302 Other seasonal allergic rhinitis: Secondary | ICD-10-CM | POA: Diagnosis not present

## 2019-12-04 DIAGNOSIS — E785 Hyperlipidemia, unspecified: Secondary | ICD-10-CM | POA: Diagnosis not present

## 2019-12-04 DIAGNOSIS — M810 Age-related osteoporosis without current pathological fracture: Secondary | ICD-10-CM | POA: Diagnosis not present

## 2020-03-25 DIAGNOSIS — M25562 Pain in left knee: Secondary | ICD-10-CM | POA: Diagnosis not present

## 2020-03-25 DIAGNOSIS — M1712 Unilateral primary osteoarthritis, left knee: Secondary | ICD-10-CM | POA: Diagnosis not present

## 2020-05-06 DIAGNOSIS — I890 Lymphedema, not elsewhere classified: Secondary | ICD-10-CM | POA: Diagnosis not present

## 2020-05-06 DIAGNOSIS — I83893 Varicose veins of bilateral lower extremities with other complications: Secondary | ICD-10-CM | POA: Diagnosis not present

## 2020-05-29 DIAGNOSIS — Z23 Encounter for immunization: Secondary | ICD-10-CM | POA: Diagnosis not present

## 2020-06-14 DIAGNOSIS — M810 Age-related osteoporosis without current pathological fracture: Secondary | ICD-10-CM | POA: Diagnosis not present

## 2020-06-14 DIAGNOSIS — I872 Venous insufficiency (chronic) (peripheral): Secondary | ICD-10-CM | POA: Diagnosis not present

## 2020-06-14 DIAGNOSIS — J302 Other seasonal allergic rhinitis: Secondary | ICD-10-CM | POA: Diagnosis not present

## 2020-06-14 DIAGNOSIS — K429 Umbilical hernia without obstruction or gangrene: Secondary | ICD-10-CM | POA: Diagnosis not present

## 2020-06-14 DIAGNOSIS — I10 Essential (primary) hypertension: Secondary | ICD-10-CM | POA: Diagnosis not present

## 2020-06-14 DIAGNOSIS — E785 Hyperlipidemia, unspecified: Secondary | ICD-10-CM | POA: Diagnosis not present

## 2020-06-14 DIAGNOSIS — M1990 Unspecified osteoarthritis, unspecified site: Secondary | ICD-10-CM | POA: Diagnosis not present

## 2020-06-14 DIAGNOSIS — Z283 Underimmunization status: Secondary | ICD-10-CM | POA: Diagnosis not present

## 2020-06-14 DIAGNOSIS — N318 Other neuromuscular dysfunction of bladder: Secondary | ICD-10-CM | POA: Diagnosis not present

## 2020-06-14 DIAGNOSIS — K219 Gastro-esophageal reflux disease without esophagitis: Secondary | ICD-10-CM | POA: Diagnosis not present

## 2020-06-25 DIAGNOSIS — Z23 Encounter for immunization: Secondary | ICD-10-CM | POA: Diagnosis not present

## 2020-07-29 DIAGNOSIS — H401131 Primary open-angle glaucoma, bilateral, mild stage: Secondary | ICD-10-CM | POA: Diagnosis not present

## 2020-09-20 DIAGNOSIS — M1712 Unilateral primary osteoarthritis, left knee: Secondary | ICD-10-CM | POA: Diagnosis not present

## 2020-09-20 DIAGNOSIS — M25562 Pain in left knee: Secondary | ICD-10-CM | POA: Diagnosis not present

## 2020-12-09 DIAGNOSIS — I872 Venous insufficiency (chronic) (peripheral): Secondary | ICD-10-CM | POA: Diagnosis not present

## 2020-12-09 DIAGNOSIS — N318 Other neuromuscular dysfunction of bladder: Secondary | ICD-10-CM | POA: Diagnosis not present

## 2020-12-09 DIAGNOSIS — E785 Hyperlipidemia, unspecified: Secondary | ICD-10-CM | POA: Diagnosis not present

## 2020-12-09 DIAGNOSIS — K219 Gastro-esophageal reflux disease without esophagitis: Secondary | ICD-10-CM | POA: Diagnosis not present

## 2020-12-09 DIAGNOSIS — I10 Essential (primary) hypertension: Secondary | ICD-10-CM | POA: Diagnosis not present

## 2020-12-09 DIAGNOSIS — K429 Umbilical hernia without obstruction or gangrene: Secondary | ICD-10-CM | POA: Diagnosis not present

## 2020-12-09 DIAGNOSIS — J302 Other seasonal allergic rhinitis: Secondary | ICD-10-CM | POA: Diagnosis not present

## 2020-12-09 DIAGNOSIS — M1990 Unspecified osteoarthritis, unspecified site: Secondary | ICD-10-CM | POA: Diagnosis not present

## 2020-12-09 DIAGNOSIS — M810 Age-related osteoporosis without current pathological fracture: Secondary | ICD-10-CM | POA: Diagnosis not present

## 2020-12-23 DIAGNOSIS — K1370 Unspecified lesions of oral mucosa: Secondary | ICD-10-CM | POA: Diagnosis not present

## 2020-12-24 ENCOUNTER — Other Ambulatory Visit: Payer: Self-pay

## 2020-12-24 ENCOUNTER — Emergency Department (HOSPITAL_COMMUNITY)
Admission: EM | Admit: 2020-12-24 | Discharge: 2020-12-24 | Disposition: A | Discharge status: Home / Self Care | Payer: Medicare Other | Attending: Emergency Medicine | Admitting: Emergency Medicine

## 2020-12-24 ENCOUNTER — Emergency Department (HOSPITAL_COMMUNITY): Payer: Medicare Other

## 2020-12-24 DIAGNOSIS — U071 COVID-19: Secondary | ICD-10-CM | POA: Insufficient documentation

## 2020-12-24 DIAGNOSIS — Z7401 Bed confinement status: Secondary | ICD-10-CM | POA: Diagnosis not present

## 2020-12-24 DIAGNOSIS — M2550 Pain in unspecified joint: Secondary | ICD-10-CM | POA: Diagnosis not present

## 2020-12-24 DIAGNOSIS — R070 Pain in throat: Secondary | ICD-10-CM | POA: Diagnosis not present

## 2020-12-24 DIAGNOSIS — R509 Fever, unspecified: Secondary | ICD-10-CM | POA: Diagnosis not present

## 2020-12-24 DIAGNOSIS — Z87891 Personal history of nicotine dependence: Secondary | ICD-10-CM | POA: Diagnosis not present

## 2020-12-24 DIAGNOSIS — R0902 Hypoxemia: Secondary | ICD-10-CM | POA: Diagnosis not present

## 2020-12-24 DIAGNOSIS — Z79899 Other long term (current) drug therapy: Secondary | ICD-10-CM | POA: Insufficient documentation

## 2020-12-24 DIAGNOSIS — I10 Essential (primary) hypertension: Secondary | ICD-10-CM | POA: Diagnosis not present

## 2020-12-24 DIAGNOSIS — R5381 Other malaise: Secondary | ICD-10-CM | POA: Diagnosis not present

## 2020-12-24 DIAGNOSIS — R0689 Other abnormalities of breathing: Secondary | ICD-10-CM | POA: Diagnosis not present

## 2020-12-24 DIAGNOSIS — Z0389 Encounter for observation for other suspected diseases and conditions ruled out: Secondary | ICD-10-CM | POA: Diagnosis not present

## 2020-12-24 DIAGNOSIS — I708 Atherosclerosis of other arteries: Secondary | ICD-10-CM | POA: Diagnosis not present

## 2020-12-24 LAB — RESP PANEL BY RT-PCR (FLU A&B, COVID) ARPGX2
Influenza A by PCR: NEGATIVE
Influenza B by PCR: NEGATIVE
SARS Coronavirus 2 by RT PCR: POSITIVE — AB

## 2020-12-24 LAB — CBC WITH DIFFERENTIAL/PLATELET
Abs Immature Granulocytes: 0.02 10*3/uL (ref 0.00–0.07)
Basophils Absolute: 0 10*3/uL (ref 0.0–0.1)
Basophils Relative: 0 %
Eosinophils Absolute: 0 10*3/uL (ref 0.0–0.5)
Eosinophils Relative: 0 %
HCT: 37.7 % (ref 36.0–46.0)
Hemoglobin: 12.7 g/dL (ref 12.0–15.0)
Immature Granulocytes: 0 %
Lymphocytes Relative: 6 %
Lymphs Abs: 0.4 10*3/uL — ABNORMAL LOW (ref 0.7–4.0)
MCH: 30.3 pg (ref 26.0–34.0)
MCHC: 33.7 g/dL (ref 30.0–36.0)
MCV: 90 fL (ref 80.0–100.0)
Monocytes Absolute: 0.5 10*3/uL (ref 0.1–1.0)
Monocytes Relative: 8 %
Neutro Abs: 5.7 10*3/uL (ref 1.7–7.7)
Neutrophils Relative %: 86 %
Platelets: 193 10*3/uL (ref 150–400)
RBC: 4.19 MIL/uL (ref 3.87–5.11)
RDW: 12.7 % (ref 11.5–15.5)
WBC: 6.7 10*3/uL (ref 4.0–10.5)
nRBC: 0 % (ref 0.0–0.2)

## 2020-12-24 LAB — URINALYSIS, ROUTINE W REFLEX MICROSCOPIC
Bacteria, UA: NONE SEEN
Bilirubin Urine: NEGATIVE
Glucose, UA: NEGATIVE mg/dL
Ketones, ur: NEGATIVE mg/dL
Leukocytes,Ua: NEGATIVE
Nitrite: NEGATIVE
Protein, ur: NEGATIVE mg/dL
Specific Gravity, Urine: 1.014 (ref 1.005–1.030)
pH: 5 (ref 5.0–8.0)

## 2020-12-24 LAB — COMPREHENSIVE METABOLIC PANEL
ALT: 14 U/L (ref 0–44)
AST: 22 U/L (ref 15–41)
Albumin: 4 g/dL (ref 3.5–5.0)
Alkaline Phosphatase: 51 U/L (ref 38–126)
Anion gap: 12 (ref 5–15)
BUN: 23 mg/dL (ref 8–23)
CO2: 22 mmol/L (ref 22–32)
Calcium: 9.1 mg/dL (ref 8.9–10.3)
Chloride: 101 mmol/L (ref 98–111)
Creatinine, Ser: 0.96 mg/dL (ref 0.44–1.00)
GFR, Estimated: 56 mL/min — ABNORMAL LOW (ref >60)
Glucose, Bld: 174 mg/dL — ABNORMAL HIGH (ref 70–99)
Potassium: 3.3 mmol/L — ABNORMAL LOW (ref 3.5–5.1)
Sodium: 135 mmol/L (ref 135–145)
Total Bilirubin: 1 mg/dL (ref 0.3–1.2)
Total Protein: 6.9 g/dL (ref 6.5–8.1)

## 2020-12-24 LAB — GROUP A STREP BY PCR: Group A Strep by PCR: NOT DETECTED

## 2020-12-24 LAB — LACTIC ACID, PLASMA
Lactic Acid, Venous: 2.5 mmol/L (ref 0.5–1.9)
Lactic Acid, Venous: 1.3 mmol/L (ref 0.5–1.9)

## 2020-12-24 LAB — APTT: aPTT: 28 seconds (ref 24–36)

## 2020-12-24 LAB — PROTIME-INR
INR: 1.1 (ref 0.8–1.2)
Prothrombin Time: 14.2 seconds (ref 11.4–15.2)

## 2020-12-24 LAB — CBG MONITORING, ED: Glucose-Capillary: 170 mg/dL — ABNORMAL HIGH (ref 70–99)

## 2020-12-24 MED ORDER — MOLNUPIRAVIR EUA 200MG CAPSULE: 4.0000 | ORAL_CAPSULE | Freq: Two times a day (BID) | ORAL | 0 refills | Status: AC

## 2020-12-24 MED ORDER — SODIUM CHLORIDE 0.9 % IV BOLUS
1000.0000 mL | Freq: Once | INTRAVENOUS | Status: AC
  Administered 2020-12-24: 1000 mL via INTRAVENOUS

## 2020-12-24 NOTE — ED Provider Notes (Signed)
[name] DEPT Provider Note   CSN: [id number] Arrival date & time: 12/24/20  1012     History Chief Complaint  Patient presents with  . Fever  . Weakness    Tara Bennett is a 87 y.o. female.  Patient comes in with fever myalgias.   Fever Weakness Associated symptoms: fever        Past Medical History:  Diagnosis Date  . Arthritis   . Edema    bilateral ankles  . History of urethral stricture    freq/ urge  . HOH (hard of hearing)    both ears  . Hypertension    elevates legs   . Urethral stricture   . Urinary retention   . Wears hearing aid     Patient Active Problem List   Diagnosis Date Noted  . Orbital floor (blow-out) open fracture ([place]) 07/29/2015  . Urethral stenosis 07/02/2011    Past Surgical History:  Procedure Laterality Date  . CYSTO/ URETHRAL DILATION/ FULGERATION OF BLEEDER  10-16-10  . CYSTOSCOPY WITH URETHRAL DILATATION  07/02/2011   Procedure: CYSTOSCOPY WITH URETHRAL DILATATION;  Surgeon: Malka So;  Location: [facility];  Service: Urology;  Laterality: N/A;  . CYSTOSCOPY WITH URETHRAL DILATATION N/A 08/24/2013   Procedure: CYSTOSCOPY WITH URETHRAL DILATATION;  Surgeon: Irine Seal, MD;  Location: [facility];  Service: Urology;  Laterality: N/A;  . CYSTOSCOPY WITH URETHRAL DILATATION N/A 05/02/2015   Procedure: CYSTOSCOPY WITH URETHRAL DILATATION, FULGURATION BLADDER NECK;  Surgeon: Irine Seal, MD;  Location: [facility];  Service: Urology;  Laterality: N/A;  . CYSTOSCOPY WITH URETHRAL DILATATION N/A 08/03/2017   Procedure: CYSTOSCOPY WITH URETHRAL DILATATION;  Surgeon: Irine Seal, MD;  Location: [facility];  Service: Urology;  Laterality: N/A;  . ORIF ORBITAL FRACTURE Right 07/29/2015   Procedure: RIGHT ORBITAL FLOOR REPAIR ;  Surgeon: [name], MD;  Location: [facility];  Service: ENT;  Laterality: Right;     OB  History   No obstetric history on file.     No family history on file.  Social History   Tobacco Use  . Smoking status: Former Smoker    Years: 2.00    Types: Cigarettes    Quit date: 07/01/1964    Years since quitting: 56.5  . Smokeless tobacco: Never Used  Vaping Use  . Vaping Use: Never used  Substance Use Topics  . Alcohol use: No  . Drug use: No    Home Medications Prior to Admission medications   Medication Sig Start Date End Date Taking? Authorizing Provider  molnupiravir EUA 200 mg CAPS Take 4 capsules (800 mg total) by mouth 2 (two) times daily for 5 days. 12/24/20 12/29/20 Yes Milton Ferguson, MD  acetaminophen (TYLENOL) 325 MG tablet Take 325 mg by mouth every 6 (six) hours as needed for headache.    [provider]  aspirin 81 MG tablet Take 81 mg by mouth at bedtime.     [provider]  calcium citrate-vitamin D 200-200 MG-UNIT TABS Take 1 tablet by mouth 3 (three) times daily.     [provider]  diclofenac sodium (VOLTAREN) 1 % GEL Apply topically as needed.    [provider]  Multiple Vitamin (MULTIVITAMIN WITH MINERALS) TABS tablet Take 1 tablet by mouth daily.    [provider]  olmesartan-hydrochlorothiazide (BENICAR HCT) 20-12.5 MG tablet Take 1 tablet by mouth daily.    [provider]  simvastatin (ZOCOR) 20 MG tablet Take 20 mg by mouth at bedtime.      [provider]  UNABLE TO FIND Vitamin d 3 2000 iu    [provider]    Allergies    Furosemide  Review of Systems   Review of Systems  Constitutional: Positive for fever.  Neurological: Positive for weakness.    Physical Exam Updated Vital Signs BP 129/64   Pulse (!) 56   Temp 98.2 F (36.8 C) (Oral)   Resp 14   Ht 5\' 2"  (1.575 m)   Wt 72.6 kg   SpO2 95%   BMI 29.26 kg/m   Physical Exam  ED Results / Procedures / Treatments   Labs (all labs ordered are listed, but only abnormal results are displayed) Labs  Reviewed  RESP PANEL BY RT-PCR (FLU A&B, COVID) ARPGX2 - Abnormal; Notable for the following components:      Result Value   SARS Coronavirus 2 by RT PCR POSITIVE (*)    All other components within normal limits  LACTIC ACID, PLASMA - Abnormal; Notable for the following components:   Lactic Acid, Venous 2.5 (*)    All other components within normal limits  COMPREHENSIVE METABOLIC PANEL - Abnormal; Notable for the following components:   Potassium 3.3 (*)    Glucose, Bld 174 (*)    GFR, Estimated 56 (*)    All other components within normal limits  CBC WITH DIFFERENTIAL/PLATELET - Abnormal; Notable for the following components:   Lymphs Abs 0.4 (*)    All other components within normal limits  URINALYSIS, ROUTINE W REFLEX MICROSCOPIC - Abnormal; Notable for the following components:   Hgb urine dipstick MODERATE (*)    All other components within normal limits  CBG MONITORING, ED - Abnormal; Notable for the following components:   Glucose-Capillary 170 (*)    All other components within normal limits  GROUP A STREP BY PCR  CULTURE, BLOOD (SINGLE)  URINE CULTURE  LACTIC ACID, PLASMA  PROTIME-INR  APTT    EKG None  Radiology DG Pelvis Portable  Result Date: 12/24/2020 CLINICAL DATA:  87 year old female with possible sepsis. EXAM: PORTABLE PELVIS 1-2 VIEWS COMPARISON:  None. FINDINGS: Portable AP view at 1103 hours. Femoral heads are normally located. Hip joint spaces appear symmetric and normal for age. Bone mineralization is within normal limits for age. Normal SI joints. No acute osseous abnormality identified. Calcified femoral artery atherosclerosis. Visible bowel gas pattern is within normal limits. IMPRESSION: Normal for age radiographic appearance of the pelvis. Electronically Signed   By: Genevie Ann M.D.   On: 12/24/2020 11:22   DG Chest Port 1 View  Result Date: 12/24/2020 CLINICAL DATA:  87 year old female with possible sepsis. EXAM: PORTABLE CHEST 1 VIEW COMPARISON:   Chest radiographs 10/16/2010. FINDINGS: Portable AP semi upright view at 1101 hours. Chronically large lung volumes. Mediastinal contours are stable and within normal limits. Visualized tracheal air column is within normal limits. Allowing for portable technique the lungs are clear. No pneumothorax or pleural effusion. No acute osseous abnormality identified. Paucity of bowel gas in the upper abdomen. IMPRESSION: No acute cardiopulmonary abnormality. Electronically Signed   By: Genevie Ann M.D.   On: 12/24/2020 11:21    Procedures Procedures   Medications Ordered in ED Medications  sodium chloride 0.9 % bolus 1,000 mL (1,000 mLs Intravenous Bolus 12/24/20 1047)    ED Course  I have reviewed the triage vital signs and the nursing notes.  Pertinent labs &  imaging results that were available during my care of the patient were reviewed by me and considered in my medical decision making (see chart for details).    MDM Rules/Calculators/A&P                          Patient with recent COVID infection.  She will be placed on molnujpiravir tx.   patient is nontoxic and not hypoxic Final Clinical Impression(s) / ED Diagnoses Final diagnoses:  None    Rx / DC Orders ED Discharge Orders         Ordered    molnupiravir EUA 200 mg CAPS  2 times daily        12/24/20 1432           Milton Ferguson, MD 12/24/20 1435

## 2020-12-24 NOTE — Discharge Instructions (Signed)
Take Tylenol for fever drink plenty of fluids and get your medicine today at the [facility]

## 2020-12-24 NOTE — ED Notes (Signed)
Attempted to call report to [facility] x1.

## 2020-12-24 NOTE — ED Triage Notes (Addendum)
BIB [facility] from [facility]. EMS was initially called out for a fall due to weakness. No LOC and No use of blood thinners. Pt reported she started having a sore throat yesterday. No known covid exposure.  EMS gave 1000 mg tylenol and 300 ml NS 18 G left AC BP-148/76 HR-94 RR-26 T-100.2 temporal 96% room air 161 CBG A&O

## 2020-12-24 NOTE — ED Notes (Signed)
[facility] called for transport.  

## 2020-12-24 NOTE — ED Notes (Signed)
Report called and given to Barnet Pall, social worker at [facility].

## 2020-12-25 LAB — URINE CULTURE: Culture: NO GROWTH

## 2020-12-29 LAB — CULTURE, BLOOD (SINGLE): Culture: NO GROWTH

## 2021-03-26 DIAGNOSIS — M1712 Unilateral primary osteoarthritis, left knee: Secondary | ICD-10-CM | POA: Diagnosis not present

## 2021-03-26 DIAGNOSIS — M25562 Pain in left knee: Secondary | ICD-10-CM | POA: Diagnosis not present

## 2021-04-01 DIAGNOSIS — H26493 Other secondary cataract, bilateral: Secondary | ICD-10-CM | POA: Diagnosis not present

## 2021-04-01 DIAGNOSIS — H401131 Primary open-angle glaucoma, bilateral, mild stage: Secondary | ICD-10-CM | POA: Diagnosis not present

## 2021-04-16 DIAGNOSIS — R3914 Feeling of incomplete bladder emptying: Secondary | ICD-10-CM | POA: Diagnosis not present

## 2021-04-16 DIAGNOSIS — N3941 Urge incontinence: Secondary | ICD-10-CM | POA: Diagnosis not present

## 2021-04-16 DIAGNOSIS — N952 Postmenopausal atrophic vaginitis: Secondary | ICD-10-CM | POA: Diagnosis not present

## 2021-04-16 DIAGNOSIS — N3020 Other chronic cystitis without hematuria: Secondary | ICD-10-CM | POA: Diagnosis not present

## 2021-04-28 DIAGNOSIS — I890 Lymphedema, not elsewhere classified: Secondary | ICD-10-CM | POA: Diagnosis not present

## 2021-05-31 DIAGNOSIS — Z23 Encounter for immunization: Secondary | ICD-10-CM | POA: Diagnosis not present

## 2021-06-13 DIAGNOSIS — M1990 Unspecified osteoarthritis, unspecified site: Secondary | ICD-10-CM | POA: Diagnosis not present

## 2021-06-13 DIAGNOSIS — N318 Other neuromuscular dysfunction of bladder: Secondary | ICD-10-CM | POA: Diagnosis not present

## 2021-06-13 DIAGNOSIS — E785 Hyperlipidemia, unspecified: Secondary | ICD-10-CM | POA: Diagnosis not present

## 2021-06-13 DIAGNOSIS — K219 Gastro-esophageal reflux disease without esophagitis: Secondary | ICD-10-CM | POA: Diagnosis not present

## 2021-06-13 DIAGNOSIS — I872 Venous insufficiency (chronic) (peripheral): Secondary | ICD-10-CM | POA: Diagnosis not present

## 2021-06-13 DIAGNOSIS — M810 Age-related osteoporosis without current pathological fracture: Secondary | ICD-10-CM | POA: Diagnosis not present

## 2021-06-13 DIAGNOSIS — I10 Essential (primary) hypertension: Secondary | ICD-10-CM | POA: Diagnosis not present

## 2021-06-13 DIAGNOSIS — K429 Umbilical hernia without obstruction or gangrene: Secondary | ICD-10-CM | POA: Diagnosis not present

## 2021-06-13 DIAGNOSIS — J302 Other seasonal allergic rhinitis: Secondary | ICD-10-CM | POA: Diagnosis not present

## 2021-07-30 DIAGNOSIS — Z23 Encounter for immunization: Secondary | ICD-10-CM | POA: Diagnosis not present

## 2021-09-03 DIAGNOSIS — M1712 Unilateral primary osteoarthritis, left knee: Secondary | ICD-10-CM | POA: Diagnosis not present

## 2021-09-03 DIAGNOSIS — M25562 Pain in left knee: Secondary | ICD-10-CM | POA: Diagnosis not present

## 2021-09-15 ENCOUNTER — Other Ambulatory Visit: Payer: Self-pay | Admitting: Internal Medicine

## 2021-09-15 ENCOUNTER — Other Ambulatory Visit (HOSPITAL_COMMUNITY): Payer: Self-pay | Admitting: Internal Medicine

## 2021-09-15 DIAGNOSIS — R4789 Other speech disturbances: Secondary | ICD-10-CM | POA: Diagnosis not present

## 2021-09-15 DIAGNOSIS — I10 Essential (primary) hypertension: Secondary | ICD-10-CM | POA: Diagnosis not present

## 2021-09-15 DIAGNOSIS — E785 Hyperlipidemia, unspecified: Secondary | ICD-10-CM | POA: Diagnosis not present

## 2021-09-15 DIAGNOSIS — K219 Gastro-esophageal reflux disease without esophagitis: Secondary | ICD-10-CM | POA: Diagnosis not present

## 2021-09-17 ENCOUNTER — Ambulatory Visit (HOSPITAL_COMMUNITY)
Admission: RE | Admit: 2021-09-17 | Discharge: 2021-09-17 | Disposition: A | Discharge status: Home / Self Care | Payer: Medicare Other | Source: Ambulatory Visit | Attending: Internal Medicine | Admitting: Internal Medicine

## 2021-09-17 ENCOUNTER — Other Ambulatory Visit: Payer: Self-pay

## 2021-09-17 DIAGNOSIS — R4789 Other speech disturbances: Secondary | ICD-10-CM | POA: Diagnosis not present

## 2021-09-18 DIAGNOSIS — K219 Gastro-esophageal reflux disease without esophagitis: Secondary | ICD-10-CM | POA: Diagnosis not present

## 2021-09-18 DIAGNOSIS — R490 Dysphonia: Secondary | ICD-10-CM | POA: Diagnosis not present

## 2021-09-18 DIAGNOSIS — M6281 Muscle weakness (generalized): Secondary | ICD-10-CM | POA: Diagnosis not present

## 2021-09-18 DIAGNOSIS — R1313 Dysphagia, pharyngeal phase: Secondary | ICD-10-CM | POA: Diagnosis not present

## 2021-09-18 DIAGNOSIS — N3946 Mixed incontinence: Secondary | ICD-10-CM | POA: Diagnosis not present

## 2021-09-18 DIAGNOSIS — R471 Dysarthria and anarthria: Secondary | ICD-10-CM | POA: Diagnosis not present

## 2021-09-22 DIAGNOSIS — N3946 Mixed incontinence: Secondary | ICD-10-CM | POA: Diagnosis not present

## 2021-09-22 DIAGNOSIS — R471 Dysarthria and anarthria: Secondary | ICD-10-CM | POA: Diagnosis not present

## 2021-09-22 DIAGNOSIS — R490 Dysphonia: Secondary | ICD-10-CM | POA: Diagnosis not present

## 2021-09-22 DIAGNOSIS — K219 Gastro-esophageal reflux disease without esophagitis: Secondary | ICD-10-CM | POA: Diagnosis not present

## 2021-09-22 DIAGNOSIS — M6281 Muscle weakness (generalized): Secondary | ICD-10-CM | POA: Diagnosis not present

## 2021-09-22 DIAGNOSIS — R1313 Dysphagia, pharyngeal phase: Secondary | ICD-10-CM | POA: Diagnosis not present

## 2021-09-24 ENCOUNTER — Other Ambulatory Visit: Payer: Self-pay

## 2021-09-24 ENCOUNTER — Ambulatory Visit (HOSPITAL_COMMUNITY)
Admission: RE | Admit: 2021-09-24 | Discharge: 2021-09-24 | Disposition: A | Discharge status: Home / Self Care | Payer: Medicare Other | Source: Ambulatory Visit | Attending: Internal Medicine | Admitting: Internal Medicine

## 2021-09-24 ENCOUNTER — Other Ambulatory Visit (HOSPITAL_COMMUNITY): Payer: Self-pay | Admitting: Internal Medicine

## 2021-09-24 DIAGNOSIS — K219 Gastro-esophageal reflux disease without esophagitis: Secondary | ICD-10-CM | POA: Diagnosis not present

## 2021-09-24 DIAGNOSIS — M6281 Muscle weakness (generalized): Secondary | ICD-10-CM | POA: Diagnosis not present

## 2021-09-24 DIAGNOSIS — I639 Cerebral infarction, unspecified: Secondary | ICD-10-CM | POA: Insufficient documentation

## 2021-09-24 DIAGNOSIS — N3946 Mixed incontinence: Secondary | ICD-10-CM | POA: Diagnosis not present

## 2021-09-24 DIAGNOSIS — R490 Dysphonia: Secondary | ICD-10-CM | POA: Diagnosis not present

## 2021-09-24 DIAGNOSIS — R471 Dysarthria and anarthria: Secondary | ICD-10-CM | POA: Diagnosis not present

## 2021-09-24 DIAGNOSIS — R1313 Dysphagia, pharyngeal phase: Secondary | ICD-10-CM | POA: Diagnosis not present

## 2021-09-26 DIAGNOSIS — M6281 Muscle weakness (generalized): Secondary | ICD-10-CM | POA: Diagnosis not present

## 2021-09-26 DIAGNOSIS — N3946 Mixed incontinence: Secondary | ICD-10-CM | POA: Diagnosis not present

## 2021-09-26 DIAGNOSIS — K219 Gastro-esophageal reflux disease without esophagitis: Secondary | ICD-10-CM | POA: Diagnosis not present

## 2021-09-26 DIAGNOSIS — R1313 Dysphagia, pharyngeal phase: Secondary | ICD-10-CM | POA: Diagnosis not present

## 2021-09-26 DIAGNOSIS — R490 Dysphonia: Secondary | ICD-10-CM | POA: Diagnosis not present

## 2021-09-26 DIAGNOSIS — R471 Dysarthria and anarthria: Secondary | ICD-10-CM | POA: Diagnosis not present

## 2021-09-29 DIAGNOSIS — N3946 Mixed incontinence: Secondary | ICD-10-CM | POA: Diagnosis not present

## 2021-09-29 DIAGNOSIS — M6281 Muscle weakness (generalized): Secondary | ICD-10-CM | POA: Diagnosis not present

## 2021-09-29 DIAGNOSIS — R490 Dysphonia: Secondary | ICD-10-CM | POA: Diagnosis not present

## 2021-09-29 DIAGNOSIS — R1313 Dysphagia, pharyngeal phase: Secondary | ICD-10-CM | POA: Diagnosis not present

## 2021-09-29 DIAGNOSIS — K219 Gastro-esophageal reflux disease without esophagitis: Secondary | ICD-10-CM | POA: Diagnosis not present

## 2021-09-29 DIAGNOSIS — R471 Dysarthria and anarthria: Secondary | ICD-10-CM | POA: Diagnosis not present

## 2021-09-30 DIAGNOSIS — R490 Dysphonia: Secondary | ICD-10-CM | POA: Diagnosis not present

## 2021-09-30 DIAGNOSIS — N3946 Mixed incontinence: Secondary | ICD-10-CM | POA: Diagnosis not present

## 2021-09-30 DIAGNOSIS — M6281 Muscle weakness (generalized): Secondary | ICD-10-CM | POA: Diagnosis not present

## 2021-09-30 DIAGNOSIS — R471 Dysarthria and anarthria: Secondary | ICD-10-CM | POA: Diagnosis not present

## 2021-09-30 DIAGNOSIS — K219 Gastro-esophageal reflux disease without esophagitis: Secondary | ICD-10-CM | POA: Diagnosis not present

## 2021-09-30 DIAGNOSIS — R1313 Dysphagia, pharyngeal phase: Secondary | ICD-10-CM | POA: Diagnosis not present

## 2021-10-01 DIAGNOSIS — N3946 Mixed incontinence: Secondary | ICD-10-CM | POA: Diagnosis not present

## 2021-10-01 DIAGNOSIS — R490 Dysphonia: Secondary | ICD-10-CM | POA: Diagnosis not present

## 2021-10-01 DIAGNOSIS — K219 Gastro-esophageal reflux disease without esophagitis: Secondary | ICD-10-CM | POA: Diagnosis not present

## 2021-10-01 DIAGNOSIS — R1313 Dysphagia, pharyngeal phase: Secondary | ICD-10-CM | POA: Diagnosis not present

## 2021-10-01 DIAGNOSIS — M6281 Muscle weakness (generalized): Secondary | ICD-10-CM | POA: Diagnosis not present

## 2021-10-01 DIAGNOSIS — R471 Dysarthria and anarthria: Secondary | ICD-10-CM | POA: Diagnosis not present

## 2021-10-02 DIAGNOSIS — R471 Dysarthria and anarthria: Secondary | ICD-10-CM | POA: Diagnosis not present

## 2021-10-02 DIAGNOSIS — N3946 Mixed incontinence: Secondary | ICD-10-CM | POA: Diagnosis not present

## 2021-10-02 DIAGNOSIS — R490 Dysphonia: Secondary | ICD-10-CM | POA: Diagnosis not present

## 2021-10-02 DIAGNOSIS — R1313 Dysphagia, pharyngeal phase: Secondary | ICD-10-CM | POA: Diagnosis not present

## 2021-10-02 DIAGNOSIS — M6281 Muscle weakness (generalized): Secondary | ICD-10-CM | POA: Diagnosis not present

## 2021-10-02 DIAGNOSIS — K219 Gastro-esophageal reflux disease without esophagitis: Secondary | ICD-10-CM | POA: Diagnosis not present

## 2021-10-03 DIAGNOSIS — R471 Dysarthria and anarthria: Secondary | ICD-10-CM | POA: Diagnosis not present

## 2021-10-03 DIAGNOSIS — M6281 Muscle weakness (generalized): Secondary | ICD-10-CM | POA: Diagnosis not present

## 2021-10-03 DIAGNOSIS — N3946 Mixed incontinence: Secondary | ICD-10-CM | POA: Diagnosis not present

## 2021-10-03 DIAGNOSIS — K219 Gastro-esophageal reflux disease without esophagitis: Secondary | ICD-10-CM | POA: Diagnosis not present

## 2021-10-03 DIAGNOSIS — R490 Dysphonia: Secondary | ICD-10-CM | POA: Diagnosis not present

## 2021-10-03 DIAGNOSIS — R1313 Dysphagia, pharyngeal phase: Secondary | ICD-10-CM | POA: Diagnosis not present

## 2021-10-06 DIAGNOSIS — R471 Dysarthria and anarthria: Secondary | ICD-10-CM | POA: Diagnosis not present

## 2021-10-06 DIAGNOSIS — R490 Dysphonia: Secondary | ICD-10-CM | POA: Diagnosis not present

## 2021-10-06 DIAGNOSIS — M6281 Muscle weakness (generalized): Secondary | ICD-10-CM | POA: Diagnosis not present

## 2021-10-06 DIAGNOSIS — K219 Gastro-esophageal reflux disease without esophagitis: Secondary | ICD-10-CM | POA: Diagnosis not present

## 2021-10-06 DIAGNOSIS — N3946 Mixed incontinence: Secondary | ICD-10-CM | POA: Diagnosis not present

## 2021-10-06 DIAGNOSIS — R1313 Dysphagia, pharyngeal phase: Secondary | ICD-10-CM | POA: Diagnosis not present

## 2021-10-07 DIAGNOSIS — R471 Dysarthria and anarthria: Secondary | ICD-10-CM | POA: Diagnosis not present

## 2021-10-07 DIAGNOSIS — R1313 Dysphagia, pharyngeal phase: Secondary | ICD-10-CM | POA: Diagnosis not present

## 2021-10-07 DIAGNOSIS — N3946 Mixed incontinence: Secondary | ICD-10-CM | POA: Diagnosis not present

## 2021-10-07 DIAGNOSIS — R490 Dysphonia: Secondary | ICD-10-CM | POA: Diagnosis not present

## 2021-10-07 DIAGNOSIS — M6281 Muscle weakness (generalized): Secondary | ICD-10-CM | POA: Diagnosis not present

## 2021-10-07 DIAGNOSIS — K219 Gastro-esophageal reflux disease without esophagitis: Secondary | ICD-10-CM | POA: Diagnosis not present

## 2021-10-08 DIAGNOSIS — K219 Gastro-esophageal reflux disease without esophagitis: Secondary | ICD-10-CM | POA: Diagnosis not present

## 2021-10-08 DIAGNOSIS — N3946 Mixed incontinence: Secondary | ICD-10-CM | POA: Diagnosis not present

## 2021-10-08 DIAGNOSIS — M6281 Muscle weakness (generalized): Secondary | ICD-10-CM | POA: Diagnosis not present

## 2021-10-08 DIAGNOSIS — R471 Dysarthria and anarthria: Secondary | ICD-10-CM | POA: Diagnosis not present

## 2021-10-08 DIAGNOSIS — R1313 Dysphagia, pharyngeal phase: Secondary | ICD-10-CM | POA: Diagnosis not present

## 2021-10-08 DIAGNOSIS — R490 Dysphonia: Secondary | ICD-10-CM | POA: Diagnosis not present

## 2021-10-09 DIAGNOSIS — R490 Dysphonia: Secondary | ICD-10-CM | POA: Diagnosis not present

## 2021-10-09 DIAGNOSIS — M6281 Muscle weakness (generalized): Secondary | ICD-10-CM | POA: Diagnosis not present

## 2021-10-09 DIAGNOSIS — N3946 Mixed incontinence: Secondary | ICD-10-CM | POA: Diagnosis not present

## 2021-10-09 DIAGNOSIS — R1313 Dysphagia, pharyngeal phase: Secondary | ICD-10-CM | POA: Diagnosis not present

## 2021-10-09 DIAGNOSIS — K219 Gastro-esophageal reflux disease without esophagitis: Secondary | ICD-10-CM | POA: Diagnosis not present

## 2021-10-09 DIAGNOSIS — R471 Dysarthria and anarthria: Secondary | ICD-10-CM | POA: Diagnosis not present

## 2021-10-10 DIAGNOSIS — K219 Gastro-esophageal reflux disease without esophagitis: Secondary | ICD-10-CM | POA: Diagnosis not present

## 2021-10-10 DIAGNOSIS — R490 Dysphonia: Secondary | ICD-10-CM | POA: Diagnosis not present

## 2021-10-10 DIAGNOSIS — N3946 Mixed incontinence: Secondary | ICD-10-CM | POA: Diagnosis not present

## 2021-10-10 DIAGNOSIS — M6281 Muscle weakness (generalized): Secondary | ICD-10-CM | POA: Diagnosis not present

## 2021-10-10 DIAGNOSIS — R1313 Dysphagia, pharyngeal phase: Secondary | ICD-10-CM | POA: Diagnosis not present

## 2021-10-10 DIAGNOSIS — R471 Dysarthria and anarthria: Secondary | ICD-10-CM | POA: Diagnosis not present

## 2021-10-13 DIAGNOSIS — R471 Dysarthria and anarthria: Secondary | ICD-10-CM | POA: Diagnosis not present

## 2021-10-13 DIAGNOSIS — M6281 Muscle weakness (generalized): Secondary | ICD-10-CM | POA: Diagnosis not present

## 2021-10-13 DIAGNOSIS — N3946 Mixed incontinence: Secondary | ICD-10-CM | POA: Diagnosis not present

## 2021-10-13 DIAGNOSIS — K219 Gastro-esophageal reflux disease without esophagitis: Secondary | ICD-10-CM | POA: Diagnosis not present

## 2021-10-13 DIAGNOSIS — R490 Dysphonia: Secondary | ICD-10-CM | POA: Diagnosis not present

## 2021-10-13 DIAGNOSIS — R1313 Dysphagia, pharyngeal phase: Secondary | ICD-10-CM | POA: Diagnosis not present

## 2021-10-14 DIAGNOSIS — R1313 Dysphagia, pharyngeal phase: Secondary | ICD-10-CM | POA: Diagnosis not present

## 2021-10-14 DIAGNOSIS — M6281 Muscle weakness (generalized): Secondary | ICD-10-CM | POA: Diagnosis not present

## 2021-10-14 DIAGNOSIS — R490 Dysphonia: Secondary | ICD-10-CM | POA: Diagnosis not present

## 2021-10-14 DIAGNOSIS — R471 Dysarthria and anarthria: Secondary | ICD-10-CM | POA: Diagnosis not present

## 2021-10-14 DIAGNOSIS — N3946 Mixed incontinence: Secondary | ICD-10-CM | POA: Diagnosis not present

## 2021-10-14 DIAGNOSIS — K219 Gastro-esophageal reflux disease without esophagitis: Secondary | ICD-10-CM | POA: Diagnosis not present

## 2021-10-15 DIAGNOSIS — R490 Dysphonia: Secondary | ICD-10-CM | POA: Diagnosis not present

## 2021-10-15 DIAGNOSIS — N3946 Mixed incontinence: Secondary | ICD-10-CM | POA: Diagnosis not present

## 2021-10-15 DIAGNOSIS — R1313 Dysphagia, pharyngeal phase: Secondary | ICD-10-CM | POA: Diagnosis not present

## 2021-10-15 DIAGNOSIS — R471 Dysarthria and anarthria: Secondary | ICD-10-CM | POA: Diagnosis not present

## 2021-10-15 DIAGNOSIS — M6281 Muscle weakness (generalized): Secondary | ICD-10-CM | POA: Diagnosis not present

## 2021-10-15 DIAGNOSIS — K219 Gastro-esophageal reflux disease without esophagitis: Secondary | ICD-10-CM | POA: Diagnosis not present

## 2021-10-17 DIAGNOSIS — M6281 Muscle weakness (generalized): Secondary | ICD-10-CM | POA: Diagnosis not present

## 2021-10-17 DIAGNOSIS — K219 Gastro-esophageal reflux disease without esophagitis: Secondary | ICD-10-CM | POA: Diagnosis not present

## 2021-10-17 DIAGNOSIS — R1313 Dysphagia, pharyngeal phase: Secondary | ICD-10-CM | POA: Diagnosis not present

## 2021-10-17 DIAGNOSIS — R471 Dysarthria and anarthria: Secondary | ICD-10-CM | POA: Diagnosis not present

## 2021-10-17 DIAGNOSIS — N3946 Mixed incontinence: Secondary | ICD-10-CM | POA: Diagnosis not present

## 2021-10-17 DIAGNOSIS — R490 Dysphonia: Secondary | ICD-10-CM | POA: Diagnosis not present

## 2021-10-21 DIAGNOSIS — R471 Dysarthria and anarthria: Secondary | ICD-10-CM | POA: Diagnosis not present

## 2021-10-21 DIAGNOSIS — R1313 Dysphagia, pharyngeal phase: Secondary | ICD-10-CM | POA: Diagnosis not present

## 2021-10-21 DIAGNOSIS — M6281 Muscle weakness (generalized): Secondary | ICD-10-CM | POA: Diagnosis not present

## 2021-10-21 DIAGNOSIS — N3946 Mixed incontinence: Secondary | ICD-10-CM | POA: Diagnosis not present

## 2021-10-21 DIAGNOSIS — K219 Gastro-esophageal reflux disease without esophagitis: Secondary | ICD-10-CM | POA: Diagnosis not present

## 2021-10-21 DIAGNOSIS — R490 Dysphonia: Secondary | ICD-10-CM | POA: Diagnosis not present

## 2021-10-22 DIAGNOSIS — R1313 Dysphagia, pharyngeal phase: Secondary | ICD-10-CM | POA: Diagnosis not present

## 2021-10-22 DIAGNOSIS — N3946 Mixed incontinence: Secondary | ICD-10-CM | POA: Diagnosis not present

## 2021-10-22 DIAGNOSIS — K219 Gastro-esophageal reflux disease without esophagitis: Secondary | ICD-10-CM | POA: Diagnosis not present

## 2021-10-22 DIAGNOSIS — R471 Dysarthria and anarthria: Secondary | ICD-10-CM | POA: Diagnosis not present

## 2021-10-22 DIAGNOSIS — R490 Dysphonia: Secondary | ICD-10-CM | POA: Diagnosis not present

## 2021-10-22 DIAGNOSIS — M6281 Muscle weakness (generalized): Secondary | ICD-10-CM | POA: Diagnosis not present

## 2021-10-28 DIAGNOSIS — K219 Gastro-esophageal reflux disease without esophagitis: Secondary | ICD-10-CM | POA: Diagnosis not present

## 2021-10-28 DIAGNOSIS — R1313 Dysphagia, pharyngeal phase: Secondary | ICD-10-CM | POA: Diagnosis not present

## 2021-10-28 DIAGNOSIS — R490 Dysphonia: Secondary | ICD-10-CM | POA: Diagnosis not present

## 2021-10-28 DIAGNOSIS — R471 Dysarthria and anarthria: Secondary | ICD-10-CM | POA: Diagnosis not present

## 2021-10-28 DIAGNOSIS — M6281 Muscle weakness (generalized): Secondary | ICD-10-CM | POA: Diagnosis not present

## 2021-10-28 DIAGNOSIS — N3946 Mixed incontinence: Secondary | ICD-10-CM | POA: Diagnosis not present

## 2021-11-18 DIAGNOSIS — R490 Dysphonia: Secondary | ICD-10-CM | POA: Diagnosis not present

## 2021-11-18 DIAGNOSIS — K219 Gastro-esophageal reflux disease without esophagitis: Secondary | ICD-10-CM | POA: Diagnosis not present

## 2021-11-18 DIAGNOSIS — R1313 Dysphagia, pharyngeal phase: Secondary | ICD-10-CM | POA: Diagnosis not present

## 2021-11-18 DIAGNOSIS — R471 Dysarthria and anarthria: Secondary | ICD-10-CM | POA: Diagnosis not present

## 2021-12-10 DIAGNOSIS — K219 Gastro-esophageal reflux disease without esophagitis: Secondary | ICD-10-CM | POA: Diagnosis not present

## 2021-12-10 DIAGNOSIS — I10 Essential (primary) hypertension: Secondary | ICD-10-CM | POA: Diagnosis not present

## 2021-12-10 DIAGNOSIS — K429 Umbilical hernia without obstruction or gangrene: Secondary | ICD-10-CM | POA: Diagnosis not present

## 2021-12-10 DIAGNOSIS — M810 Age-related osteoporosis without current pathological fracture: Secondary | ICD-10-CM | POA: Diagnosis not present

## 2021-12-10 DIAGNOSIS — I639 Cerebral infarction, unspecified: Secondary | ICD-10-CM | POA: Diagnosis not present

## 2021-12-10 DIAGNOSIS — E785 Hyperlipidemia, unspecified: Secondary | ICD-10-CM | POA: Diagnosis not present

## 2021-12-10 DIAGNOSIS — M1990 Unspecified osteoarthritis, unspecified site: Secondary | ICD-10-CM | POA: Diagnosis not present

## 2021-12-10 DIAGNOSIS — I872 Venous insufficiency (chronic) (peripheral): Secondary | ICD-10-CM | POA: Diagnosis not present

## 2021-12-10 DIAGNOSIS — N318 Other neuromuscular dysfunction of bladder: Secondary | ICD-10-CM | POA: Diagnosis not present

## 2022-01-02 DIAGNOSIS — Z23 Encounter for immunization: Secondary | ICD-10-CM | POA: Diagnosis not present

## 2022-04-06 DIAGNOSIS — H401131 Primary open-angle glaucoma, bilateral, mild stage: Secondary | ICD-10-CM | POA: Diagnosis not present

## 2022-06-02 DIAGNOSIS — Z23 Encounter for immunization: Secondary | ICD-10-CM | POA: Diagnosis not present

## 2022-06-18 DIAGNOSIS — Z23 Encounter for immunization: Secondary | ICD-10-CM | POA: Diagnosis not present

## 2022-06-30 DIAGNOSIS — I872 Venous insufficiency (chronic) (peripheral): Secondary | ICD-10-CM | POA: Diagnosis not present

## 2022-06-30 DIAGNOSIS — E785 Hyperlipidemia, unspecified: Secondary | ICD-10-CM | POA: Diagnosis not present

## 2022-06-30 DIAGNOSIS — J302 Other seasonal allergic rhinitis: Secondary | ICD-10-CM | POA: Diagnosis not present

## 2022-06-30 DIAGNOSIS — K429 Umbilical hernia without obstruction or gangrene: Secondary | ICD-10-CM | POA: Diagnosis not present

## 2022-06-30 DIAGNOSIS — K219 Gastro-esophageal reflux disease without esophagitis: Secondary | ICD-10-CM | POA: Diagnosis not present

## 2022-06-30 DIAGNOSIS — M1990 Unspecified osteoarthritis, unspecified site: Secondary | ICD-10-CM | POA: Diagnosis not present

## 2022-06-30 DIAGNOSIS — N318 Other neuromuscular dysfunction of bladder: Secondary | ICD-10-CM | POA: Diagnosis not present

## 2022-06-30 DIAGNOSIS — I639 Cerebral infarction, unspecified: Secondary | ICD-10-CM | POA: Diagnosis not present

## 2022-06-30 DIAGNOSIS — M810 Age-related osteoporosis without current pathological fracture: Secondary | ICD-10-CM | POA: Diagnosis not present

## 2022-06-30 DIAGNOSIS — I10 Essential (primary) hypertension: Secondary | ICD-10-CM | POA: Diagnosis not present

## 2022-08-07 DIAGNOSIS — M25511 Pain in right shoulder: Secondary | ICD-10-CM | POA: Diagnosis not present

## 2022-08-07 DIAGNOSIS — M1712 Unilateral primary osteoarthritis, left knee: Secondary | ICD-10-CM | POA: Diagnosis not present

## 2022-08-07 DIAGNOSIS — M25811 Other specified joint disorders, right shoulder: Secondary | ICD-10-CM | POA: Diagnosis not present

## 2022-08-07 DIAGNOSIS — M25562 Pain in left knee: Secondary | ICD-10-CM | POA: Diagnosis not present

## 2022-09-09 ENCOUNTER — Emergency Department (HOSPITAL_BASED_OUTPATIENT_CLINIC_OR_DEPARTMENT_OTHER): Payer: Medicare Other

## 2022-09-09 ENCOUNTER — Emergency Department (HOSPITAL_BASED_OUTPATIENT_CLINIC_OR_DEPARTMENT_OTHER)
Admission: EM | Admit: 2022-09-09 | Discharge: 2022-09-09 | Disposition: A | Discharge status: Home / Self Care | Payer: Medicare Other | Attending: Emergency Medicine | Admitting: Emergency Medicine

## 2022-09-09 ENCOUNTER — Other Ambulatory Visit: Payer: Self-pay

## 2022-09-09 ENCOUNTER — Encounter (HOSPITAL_BASED_OUTPATIENT_CLINIC_OR_DEPARTMENT_OTHER): Payer: Self-pay

## 2022-09-09 ENCOUNTER — Emergency Department (HOSPITAL_BASED_OUTPATIENT_CLINIC_OR_DEPARTMENT_OTHER): Payer: Medicare Other | Admitting: Radiology

## 2022-09-09 DIAGNOSIS — S40011A Contusion of right shoulder, initial encounter: Secondary | ICD-10-CM | POA: Diagnosis not present

## 2022-09-09 DIAGNOSIS — W010XXA Fall on same level from slipping, tripping and stumbling without subsequent striking against object, initial encounter: Secondary | ICD-10-CM | POA: Insufficient documentation

## 2022-09-09 DIAGNOSIS — S0990XA Unspecified injury of head, initial encounter: Secondary | ICD-10-CM

## 2022-09-09 DIAGNOSIS — S0083XA Contusion of other part of head, initial encounter: Secondary | ICD-10-CM | POA: Insufficient documentation

## 2022-09-09 DIAGNOSIS — Z043 Encounter for examination and observation following other accident: Secondary | ICD-10-CM | POA: Diagnosis not present

## 2022-09-09 DIAGNOSIS — R41841 Cognitive communication deficit: Secondary | ICD-10-CM | POA: Diagnosis not present

## 2022-09-09 DIAGNOSIS — Z7982 Long term (current) use of aspirin: Secondary | ICD-10-CM | POA: Diagnosis not present

## 2022-09-09 DIAGNOSIS — R29898 Other symptoms and signs involving the musculoskeletal system: Secondary | ICD-10-CM | POA: Diagnosis not present

## 2022-09-09 DIAGNOSIS — M6281 Muscle weakness (generalized): Secondary | ICD-10-CM | POA: Diagnosis not present

## 2022-09-09 DIAGNOSIS — S5001XA Contusion of right elbow, initial encounter: Secondary | ICD-10-CM | POA: Insufficient documentation

## 2022-09-09 DIAGNOSIS — M47812 Spondylosis without myelopathy or radiculopathy, cervical region: Secondary | ICD-10-CM | POA: Diagnosis not present

## 2022-09-09 DIAGNOSIS — R2681 Unsteadiness on feet: Secondary | ICD-10-CM | POA: Diagnosis not present

## 2022-09-09 DIAGNOSIS — S60212A Contusion of left wrist, initial encounter: Secondary | ICD-10-CM | POA: Insufficient documentation

## 2022-09-09 DIAGNOSIS — I10 Essential (primary) hypertension: Secondary | ICD-10-CM | POA: Insufficient documentation

## 2022-09-09 DIAGNOSIS — Z79899 Other long term (current) drug therapy: Secondary | ICD-10-CM | POA: Diagnosis not present

## 2022-09-09 DIAGNOSIS — Y92129 Unspecified place in nursing home as the place of occurrence of the external cause: Secondary | ICD-10-CM | POA: Diagnosis not present

## 2022-09-09 DIAGNOSIS — R471 Dysarthria and anarthria: Secondary | ICD-10-CM | POA: Diagnosis not present

## 2022-09-09 DIAGNOSIS — W19XXXA Unspecified fall, initial encounter: Secondary | ICD-10-CM

## 2022-09-09 DIAGNOSIS — S0003XA Contusion of scalp, initial encounter: Secondary | ICD-10-CM | POA: Diagnosis not present

## 2022-09-09 MED ORDER — ACETAMINOPHEN 500 MG PO TABS
1000.0000 mg | ORAL_TABLET | Freq: Once | ORAL | Status: DC
  Filled 2022-09-09: qty 2

## 2022-09-09 NOTE — ED Notes (Signed)
Pt has bruising to right shoulder and large hematoma to right arm. Pt doesn't know where bruise came from

## 2022-09-09 NOTE — Discharge Instructions (Signed)
Thank you for coming to [facility] Emergency Department. You were seen for fall. We did an exam, labs, and imaging, and these showed no acute findings.   You can take tylenol 650 mg every 6 hours as needed for pain.   Please follow up with your primary care provider within 1 week.   Do not hesitate to return to the ED or call 911 if you experience: -Worsening symptoms -Another fall -Changes to your vision, severe headache, numbness/tingling, weakness on one side or another -Lightheadedness, passing out -Fevers/chills -Anything else that concerns you

## 2022-09-09 NOTE — ED Notes (Signed)
Family report Pt has had some issues with swallowing and is due to start working with a Electrical engineer. Pt is not in pain and tylenol was held

## 2022-09-09 NOTE — ED Triage Notes (Signed)
Patient here POV from [facility] with Family.  Endorses Fall today. Typically utilizes a Teacher, adult education but today she was using her [place] [employer]ile and turned and lost her balance falling forward onto the [employer]. Occurred at 1400 today approximately.  No LOC. Bruising to Forehead. No Anticoagulants.   NAD noted during Triage. Active and Alert.

## 2022-09-09 NOTE — ED Provider Notes (Signed)
[facility] Provider Note   CSN: [id number] Arrival date & time: 09/09/22  1604     History  Chief Complaint  Patient presents with   Tara Bennett    Tara Bennett is a 89 y.o. female with osteoarthritis, urethral stenosis who presents with fall.  Presents with her nephew and his wife who provide additional history.  Patient is presenting from her facility after a fall that occurred today.  She typically uses a scooter to get around but today she used her walker and walked all the way down the hallway..  She states she turned around and lost her balance and fell flat on her face.  Did not lose consciousness.  Occurred at 2 PM today.  She does not take any blood thinners.  She endorses some pain in her right shoulder as well as her forehead.  Denies any numbness tingling, asymmetric weakness, fever/chills, cough, chest pain, abdominal pain, recent illnesses, urinary symptoms, dizziness.  They note some issues with swallowing that have been chronic and they are working with a Electrical engineer.   Fall       Home Medications Prior to Admission medications   Medication Sig Start Date End Date Taking? Authorizing Provider  acetaminophen (TYLENOL) 325 MG tablet Take 325 mg by mouth every 6 (six) hours as needed for headache.    [provider]  aspirin 81 MG tablet Take 81 mg by mouth at bedtime.     [provider]  calcium citrate-vitamin D 200-200 MG-UNIT TABS Take 1 tablet by mouth 3 (three) times daily.     [provider]  diclofenac sodium (VOLTAREN) 1 % GEL Apply topically as needed.    [provider]  Multiple Vitamin (MULTIVITAMIN WITH MINERALS) TABS tablet Take 1 tablet by mouth daily.    [provider]  olmesartan-hydrochlorothiazide (BENICAR HCT) 20-12.5 MG tablet Take 1 tablet by mouth daily.    [provider]  simvastatin (ZOCOR) 20 MG tablet Take 20 mg by mouth at  bedtime.      [provider]  UNABLE TO FIND Vitamin d 3 2000 iu    [provider]      Allergies    Furosemide    Review of Systems   Review of Systems Review of systems Negative for f/c, LOC.  A 10 point review of systems was performed and is negative unless otherwise reported in HPI.  Physical Exam Updated Vital Signs BP 120/78 (BP Location: Right Arm)   Pulse 72   Temp 97.9 F (36.6 C) (Oral)   Resp 18   Ht '5\' 2"'$  (1.575 m)   Wt 72.6 kg   SpO2 98%   BMI 29.27 kg/m  Physical Exam  PRIMARY SURVEY  Airway Airway intact  Breathing Bilateral breath sounds  Circulation Carotid/femoral pulses 2+ intact bilaterally  GCS E =  4 V =  5 M =  6 Total = 15  Environment All clothes removed      SECONDARY SURVEY  Gen: -NAD  HEENT: -Head: NCAT. Scalp is clear of lacerations or wounds. Skull is clear of deformities or depressions -Forehead: 2x1 cm ecchymosis with small hematoma to middle of forehead without significant TTP and no deformity -Midface: Stable -Eyes: No visible injury to eyelids or eye, PERRL, EOMI -Nose: No gross deformities, no septal hematoma -Mouth: No injuries to lips, tongue or teeth. No trismus or malposition -Ears: No auricular hematoma -Neck: Trachea is midline, no distended neck veins  Chest: -No tenderness, deformities, bruising or crepitus to clavicles or chest -Normal chest expansion -Normal heart sounds, S1/S2 normal, no m/r/g -No wheezes, rales, rhonchi  Abdomen: -No tenderness, bruising or penetrating injury  Pelvis: -Pelvis is stable and non-tender  Extremities: Right Upper Extremity: -Ecchymosis to R shoulder and R elbow with mild swelling, no significant hematoma, compartment is soft -No deformity or other signs of injury -Radial pulse intact RUE, cap refill good -Normal sensation -ROM limited in R shoulder d/t prior stroke but with good strength in elbow/wrist, patient states this is her baseline Left Upper  Extremity: -Small ecchymosis to medial left wrist that is nontender -No point tenderness, deformity or other signs of injury -Radial pulse intact LUE, cap refill good -Normal sensation -Normal ROM, good strength Right Lower Extremity: -No point tenderness, deformity or other signs of injury -DP intact RLE -Normal sensation -Normal ROM, good strength Left Lower Extremity: -No point tenderness, deformity or other signs of injury -DP intact LLE -Normal sensation -Normal ROM, good strength  Back/Spine: -No midline C, T, or L spine tenderness or step-offs  Other: N/A     ED Results / Procedures / Treatments   Labs (all labs ordered are listed, but only abnormal results are displayed) Labs Reviewed - No data to display  EKG None  Radiology DG Shoulder Right  Result Date: 09/09/2022 CLINICAL DATA:  Fall today. Lost balance leading to fall on to ground. EXAM: RIGHT SHOULDER - 2+ VIEW COMPARISON:  None Available. FINDINGS: There is no evidence of fracture or dislocation. Mild acromioclavicular degenerative spurring. Subcortical cystic change the lateral humeral head. Soft tissue edema laterally. IMPRESSION: 1. Lateral soft tissue edema without acute fracture or dislocation. 2. Mild acromioclavicular degenerative spurring. Electronically Signed   By: Keith Rake M.D.   On: 09/09/2022 18:26   DG Elbow Complete Right  Result Date: 09/09/2022 CLINICAL DATA:  Fall today. Lost balance leading to fall on to ground. EXAM: RIGHT ELBOW - COMPLETE 3+ VIEW COMPARISON:  None Available. FINDINGS: No acute fracture or dislocation. Lateral view is slightly limited due to positioning, allowing for this no significant joint effusion. Mild degenerative spurring. There is soft tissue edema laterally. IMPRESSION: Soft tissue edema without acute fracture or dislocation. Electronically Signed   By: Keith Rake M.D.   On: 09/09/2022 18:24   DG Chest 1 View  Result Date: 09/09/2022 CLINICAL DATA:   Fall today. Lost balance leading to fall on to ground. EXAM: CHEST  1 VIEW COMPARISON:  Radiograph 12/24/2020 FINDINGS: Patient is rotated. Normal heart size with stable mediastinal contours. Aortic atherosclerosis. No pneumothorax, large pleural effusion or focal airspace disease. No evidence of acute fracture. IMPRESSION: No acute abnormality or evidence of traumatic injury. Electronically Signed   By: Keith Rake M.D.   On: 09/09/2022 18:23   DG Humerus Right  Result Date: 09/09/2022 CLINICAL DATA:  Fall today. Lost balance leading to fall on to ground. EXAM: RIGHT HUMERUS - 2+ VIEW COMPARISON:  None Available. FINDINGS: There is no evidence of fracture or other focal bone lesions. Cortical margins of the humerus are intact. Soft tissue edema is seen proximal laterally. IMPRESSION: No acute fracture of the humerus. Electronically Signed   By: Keith Rake M.D.   On: 09/09/2022 18:19   CT Head Wo Contrast  Result Date: 09/09/2022 CLINICAL DATA:  Provided history: Polytrauma, blunt.  Fall. EXAM: CT HEAD WITHOUT CONTRAST CT CERVICAL SPINE WITHOUT CONTRAST TECHNIQUE: Multidetector CT imaging of the head and cervical spine was performed following  the standard protocol without intravenous contrast. Multiplanar CT image reconstructions of the cervical spine were also generated. RADIATION DOSE REDUCTION: This exam was performed according to the departmental dose-optimization program which includes automated exposure control, adjustment of the mA and/or kV according to patient size and/or use of iterative reconstruction technique. COMPARISON:  Brain MRI 09/17/2021. Head CT 07/15/2015. Maxillofacial CT 07/15/2015. FINDINGS: CT HEAD FINDINGS Brain: Generalized cerebral atrophy. Moderate patchy and ill-defined hypoattenuation within the cerebral white matter, nonspecific but compatible with chronic small vessel disease. As before, there are multiple small hypodensities within the bilateral deep gray nuclei  and left internal capsule which likely reflect a combination of prominent perivascular spaces and chronic lacunar infarcts. There is no acute intracranial hemorrhage. No demarcated cortical infarct. No extra-axial fluid collection. No evidence of an intracranial mass. No midline shift. Vascular: No hyperdense vessel. Atherosclerotic calcifications. Skull: No fracture or aggressive osseous lesion. Sinuses/Orbits: No mass or acute finding within the imaged orbits. Prior metallic mesh repair of a right orbital floor fracture. Mild mucosal thickening within the left maxillary sinus. Other: Right frontal scalp/forehead hematoma. CT CERVICAL SPINE FINDINGS Alignment: Straightening of the expected cervical lordosis. No significant spondylolisthesis. Skull base and vertebrae: The basion-dental and atlanto-dental intervals are maintained.No evidence of acute fracture to the cervical spine. Soft tissues and spinal canal: No prevertebral fluid or swelling. No visible canal hematoma. Disc levels: Cervical spondylosis with multilevel disc space narrowing, disc bulges/central disc protrusions, endplate spurring, uncovertebral hypertrophy and facet arthrosis. No appreciable high-grade spinal canal stenosis. Multilevel neural foraminal narrowing. Upper chest: No consolidation within the imaged lung apices. Biapical pleuroparenchymal scarring. IMPRESSION: CT head: 1. No evidence of acute intracranial abnormality. 2. Right anterior scalp/forehead hematoma. 3. Parenchymal atrophy and chronic small vessel disease, as described. 4. Mild mucosal thickening within the left maxillary sinus. CT cervical spine: 1. No evidence of acute fracture to the cervical spine. 2. Nonspecific straightening of the expected cervical lordosis. 3. Cervical spondylosis. Electronically Signed   By: Kellie Simmering D.O.   On: 09/09/2022 16:49   CT Cervical Spine Wo Contrast  Result Date: 09/09/2022 CLINICAL DATA:  Provided history: Polytrauma, blunt.  Fall.  EXAM: CT HEAD WITHOUT CONTRAST CT CERVICAL SPINE WITHOUT CONTRAST TECHNIQUE: Multidetector CT imaging of the head and cervical spine was performed following the standard protocol without intravenous contrast. Multiplanar CT image reconstructions of the cervical spine were also generated. RADIATION DOSE REDUCTION: This exam was performed according to the departmental dose-optimization program which includes automated exposure control, adjustment of the mA and/or kV according to patient size and/or use of iterative reconstruction technique. COMPARISON:  Brain MRI 09/17/2021. Head CT 07/15/2015. Maxillofacial CT 07/15/2015. FINDINGS: CT HEAD FINDINGS Brain: Generalized cerebral atrophy. Moderate patchy and ill-defined hypoattenuation within the cerebral white matter, nonspecific but compatible with chronic small vessel disease. As before, there are multiple small hypodensities within the bilateral deep gray nuclei and left internal capsule which likely reflect a combination of prominent perivascular spaces and chronic lacunar infarcts. There is no acute intracranial hemorrhage. No demarcated cortical infarct. No extra-axial fluid collection. No evidence of an intracranial mass. No midline shift. Vascular: No hyperdense vessel. Atherosclerotic calcifications. Skull: No fracture or aggressive osseous lesion. Sinuses/Orbits: No mass or acute finding within the imaged orbits. Prior metallic mesh repair of a right orbital floor fracture. Mild mucosal thickening within the left maxillary sinus. Other: Right frontal scalp/forehead hematoma. CT CERVICAL SPINE FINDINGS Alignment: Straightening of the expected cervical lordosis. No significant spondylolisthesis. Skull base and vertebrae: The basion-dental  and atlanto-dental intervals are maintained.No evidence of acute fracture to the cervical spine. Soft tissues and spinal canal: No prevertebral fluid or swelling. No visible canal hematoma. Disc levels: Cervical spondylosis  with multilevel disc space narrowing, disc bulges/central disc protrusions, endplate spurring, uncovertebral hypertrophy and facet arthrosis. No appreciable high-grade spinal canal stenosis. Multilevel neural foraminal narrowing. Upper chest: No consolidation within the imaged lung apices. Biapical pleuroparenchymal scarring. IMPRESSION: CT head: 1. No evidence of acute intracranial abnormality. 2. Right anterior scalp/forehead hematoma. 3. Parenchymal atrophy and chronic small vessel disease, as described. 4. Mild mucosal thickening within the left maxillary sinus. CT cervical spine: 1. No evidence of acute fracture to the cervical spine. 2. Nonspecific straightening of the expected cervical lordosis. 3. Cervical spondylosis. Electronically Signed   By: Kellie Simmering D.O.   On: 09/09/2022 16:49    Procedures Procedures    Medications Ordered in ED Medications  acetaminophen (TYLENOL) tablet 1,000 mg (1,000 mg Oral Not Given 09/09/22 1738)    ED Course/ Medical Decision Making/ A&P                          Medical Decision Making Amount and/or Complexity of Data Reviewed Radiology: ordered. Decision-making details documented in ED Course.  Risk OTC drugs.    This patient presents to the ED for concern of fall with head trauma, this involves an extensive number of treatment options, and is a complaint that carries with it a high risk of complications and morbidity.  I considered the following differential and admission for this acute, potentially life threatening condition.   MDM:    DDX for trauma includes but is not limited to:  -Head Injury such as skull fx or ICH -Chest Injury and Abdominal Injury - patient with no chest pain, SOB, or e/o trauma to chest/abdomen/pelvis -Spinal Cord or Vertebral injury - CT c-spine ordered from triage, no significant pain to neck -Fractures - consider R shoulder/humerus/elbow fracture/dislocation given extent of ecchymosis but no deformities or  significant TTP, will eval w/ XRs -Patient otherwise has been at a completely normal state of health and states that she does normally walk with a scooter but chose not to today, states this is why she fell because she got overtired walking with a rollator.  She does not have any other localizing symptoms of infection or generalized weakness, no asymmetric weakness numbness tingling to indicate CVA as a cause of fall, did not lose consciousness, eating and drinking well, otherwise at her baseline.  Do not believe other labs or imaging are necessary at this time.  She is afebrile and well-appearing.  Clinical Course as of 09/09/22 1842  Wed Sep 09, 2022  1704 CT Head Wo Contrast CT head:  1. No evidence of acute intracranial abnormality. 2. Right anterior scalp/forehead hematoma. 3. Parenchymal atrophy and chronic small vessel disease, as described. 4. Mild mucosal thickening within the left maxillary sinus.  CT cervical spine:  1. No evidence of acute fracture to the cervical spine. 2. Nonspecific straightening of the expected cervical lordosis. 3. Cervical spondylosis.   [HN]  1829 DG Shoulder Right 1. Lateral soft tissue edema without acute fracture or dislocation. 2. Mild acromioclavicular degenerative spurring.   [HN]  1830 DG Elbow Complete Right Soft tissue edema without acute fracture or dislocation. [HN]  1830 DG Chest 1 View No acute abnormality or evidence of traumatic injury. [HN]  0737 DG Humerus Right No acute fracture of the humerus. [HN]  Clinical Course User Index [HN] Audley Hose, MD   Imaging Studies ordered: I ordered imaging studies including CTH, RUE XRs I independently visualized and interpreted imaging. I agree with the radiologist interpretation  Additional history obtained from nephew at bedside, chart review.    Cardiac Monitoring: The patient was maintained on a cardiac monitor.  I personally viewed and interpreted the cardiac monitored  which showed an underlying rhythm of: Normal sinus rhythm  Reevaluation: After the interventions noted above, I reevaluated the patient and found that they have :stayed the same  Social Determinants of Health:  patient lives at a facility  Disposition: Patient is offered Tylenol but she states she has no pain and so the Tylenol was held.  Patient has very reassuring images including negative CT head and C-spine as well as negative right upper extremity x-rays.  Patient is in her normal state of health and has no pain.  Patient be discharged back to her facility, given discharge instructions and return precautions, all questions answered to patient satisfaction.  Co morbidities that complicate the patient evaluation  Past Medical History:  Diagnosis Date   Arthritis    Edema    bilateral ankles   History of urethral stricture    freq/ urge   HOH (hard of hearing)    both ears   Hypertension    elevates legs    Urethral stricture    Urinary retention    Wears hearing aid      Medicines Meds ordered this encounter  Medications   acetaminophen (TYLENOL) tablet 1,000 mg    I have reviewed the patients home medicines and have made adjustments as needed  Problem List / ED Course: Problem List Items Addressed This Visit   None Visit Diagnoses     Fall, initial encounter    -  Primary   Traumatic injury of head, initial encounter                       This note was created using dictation software, which may contain spelling or grammatical errors.    Audley Hose, MD 09/09/22 [phone]

## 2022-09-09 NOTE — ED Notes (Signed)
Family at bedside.Pt verbalized understanding of d/c instructions, meds, and followup care. Denies questions. VSS, no distress noted. Steady gait to exit with walker all belongings. Family to transport home. [facility] called for report.

## 2022-09-09 NOTE — ED Notes (Signed)
Pt remembers fall and called for assist with electronic device. Pt.  denis pain  dizziness or Nausea. Pt is alert and oriented x 4. Pt is calm and can follow instructions.

## 2022-09-11 DIAGNOSIS — R29898 Other symptoms and signs involving the musculoskeletal system: Secondary | ICD-10-CM | POA: Diagnosis not present

## 2022-09-11 DIAGNOSIS — R2681 Unsteadiness on feet: Secondary | ICD-10-CM | POA: Diagnosis not present

## 2022-09-11 DIAGNOSIS — R41841 Cognitive communication deficit: Secondary | ICD-10-CM | POA: Diagnosis not present

## 2022-09-11 DIAGNOSIS — M6281 Muscle weakness (generalized): Secondary | ICD-10-CM | POA: Diagnosis not present

## 2022-09-11 DIAGNOSIS — R471 Dysarthria and anarthria: Secondary | ICD-10-CM | POA: Diagnosis not present

## 2022-09-14 DIAGNOSIS — M6281 Muscle weakness (generalized): Secondary | ICD-10-CM | POA: Diagnosis not present

## 2022-09-14 DIAGNOSIS — R2681 Unsteadiness on feet: Secondary | ICD-10-CM | POA: Diagnosis not present

## 2022-09-14 DIAGNOSIS — R471 Dysarthria and anarthria: Secondary | ICD-10-CM | POA: Diagnosis not present

## 2022-09-14 DIAGNOSIS — R29898 Other symptoms and signs involving the musculoskeletal system: Secondary | ICD-10-CM | POA: Diagnosis not present

## 2022-09-14 DIAGNOSIS — R41841 Cognitive communication deficit: Secondary | ICD-10-CM | POA: Diagnosis not present

## 2022-09-15 DIAGNOSIS — R471 Dysarthria and anarthria: Secondary | ICD-10-CM | POA: Diagnosis not present

## 2022-09-15 DIAGNOSIS — R29898 Other symptoms and signs involving the musculoskeletal system: Secondary | ICD-10-CM | POA: Diagnosis not present

## 2022-09-15 DIAGNOSIS — R41841 Cognitive communication deficit: Secondary | ICD-10-CM | POA: Diagnosis not present

## 2022-09-15 DIAGNOSIS — M6281 Muscle weakness (generalized): Secondary | ICD-10-CM | POA: Diagnosis not present

## 2022-09-15 DIAGNOSIS — R2681 Unsteadiness on feet: Secondary | ICD-10-CM | POA: Diagnosis not present

## 2022-09-17 DIAGNOSIS — R471 Dysarthria and anarthria: Secondary | ICD-10-CM | POA: Diagnosis not present

## 2022-09-17 DIAGNOSIS — R41841 Cognitive communication deficit: Secondary | ICD-10-CM | POA: Diagnosis not present

## 2022-09-17 DIAGNOSIS — R29898 Other symptoms and signs involving the musculoskeletal system: Secondary | ICD-10-CM | POA: Diagnosis not present

## 2022-09-17 DIAGNOSIS — R2681 Unsteadiness on feet: Secondary | ICD-10-CM | POA: Diagnosis not present

## 2022-09-17 DIAGNOSIS — M6281 Muscle weakness (generalized): Secondary | ICD-10-CM | POA: Diagnosis not present

## 2022-09-21 DIAGNOSIS — M6281 Muscle weakness (generalized): Secondary | ICD-10-CM | POA: Diagnosis not present

## 2022-09-21 DIAGNOSIS — R471 Dysarthria and anarthria: Secondary | ICD-10-CM | POA: Diagnosis not present

## 2022-09-21 DIAGNOSIS — R29898 Other symptoms and signs involving the musculoskeletal system: Secondary | ICD-10-CM | POA: Diagnosis not present

## 2022-09-21 DIAGNOSIS — R2681 Unsteadiness on feet: Secondary | ICD-10-CM | POA: Diagnosis not present

## 2022-09-21 DIAGNOSIS — R41841 Cognitive communication deficit: Secondary | ICD-10-CM | POA: Diagnosis not present

## 2022-09-22 DIAGNOSIS — R471 Dysarthria and anarthria: Secondary | ICD-10-CM | POA: Diagnosis not present

## 2022-09-22 DIAGNOSIS — R29898 Other symptoms and signs involving the musculoskeletal system: Secondary | ICD-10-CM | POA: Diagnosis not present

## 2022-09-22 DIAGNOSIS — R2681 Unsteadiness on feet: Secondary | ICD-10-CM | POA: Diagnosis not present

## 2022-09-22 DIAGNOSIS — R41841 Cognitive communication deficit: Secondary | ICD-10-CM | POA: Diagnosis not present

## 2022-09-22 DIAGNOSIS — M6281 Muscle weakness (generalized): Secondary | ICD-10-CM | POA: Diagnosis not present

## 2022-09-23 DIAGNOSIS — R41841 Cognitive communication deficit: Secondary | ICD-10-CM | POA: Diagnosis not present

## 2022-09-23 DIAGNOSIS — R29898 Other symptoms and signs involving the musculoskeletal system: Secondary | ICD-10-CM | POA: Diagnosis not present

## 2022-09-23 DIAGNOSIS — M6281 Muscle weakness (generalized): Secondary | ICD-10-CM | POA: Diagnosis not present

## 2022-09-23 DIAGNOSIS — R2681 Unsteadiness on feet: Secondary | ICD-10-CM | POA: Diagnosis not present

## 2022-09-23 DIAGNOSIS — R471 Dysarthria and anarthria: Secondary | ICD-10-CM | POA: Diagnosis not present

## 2022-09-24 DIAGNOSIS — R29898 Other symptoms and signs involving the musculoskeletal system: Secondary | ICD-10-CM | POA: Diagnosis not present

## 2022-09-24 DIAGNOSIS — R2681 Unsteadiness on feet: Secondary | ICD-10-CM | POA: Diagnosis not present

## 2022-09-24 DIAGNOSIS — R41841 Cognitive communication deficit: Secondary | ICD-10-CM | POA: Diagnosis not present

## 2022-09-24 DIAGNOSIS — R471 Dysarthria and anarthria: Secondary | ICD-10-CM | POA: Diagnosis not present

## 2022-09-24 DIAGNOSIS — M6281 Muscle weakness (generalized): Secondary | ICD-10-CM | POA: Diagnosis not present

## 2022-09-28 DIAGNOSIS — R41841 Cognitive communication deficit: Secondary | ICD-10-CM | POA: Diagnosis not present

## 2022-09-28 DIAGNOSIS — R2681 Unsteadiness on feet: Secondary | ICD-10-CM | POA: Diagnosis not present

## 2022-09-28 DIAGNOSIS — M6281 Muscle weakness (generalized): Secondary | ICD-10-CM | POA: Diagnosis not present

## 2022-09-28 DIAGNOSIS — R471 Dysarthria and anarthria: Secondary | ICD-10-CM | POA: Diagnosis not present

## 2022-09-28 DIAGNOSIS — R29898 Other symptoms and signs involving the musculoskeletal system: Secondary | ICD-10-CM | POA: Diagnosis not present

## 2022-09-29 DIAGNOSIS — R41841 Cognitive communication deficit: Secondary | ICD-10-CM | POA: Diagnosis not present

## 2022-09-29 DIAGNOSIS — R2681 Unsteadiness on feet: Secondary | ICD-10-CM | POA: Diagnosis not present

## 2022-09-29 DIAGNOSIS — M6281 Muscle weakness (generalized): Secondary | ICD-10-CM | POA: Diagnosis not present

## 2022-09-29 DIAGNOSIS — R471 Dysarthria and anarthria: Secondary | ICD-10-CM | POA: Diagnosis not present

## 2022-09-29 DIAGNOSIS — R29898 Other symptoms and signs involving the musculoskeletal system: Secondary | ICD-10-CM | POA: Diagnosis not present

## 2022-10-01 DIAGNOSIS — R29898 Other symptoms and signs involving the musculoskeletal system: Secondary | ICD-10-CM | POA: Diagnosis not present

## 2022-10-01 DIAGNOSIS — R2681 Unsteadiness on feet: Secondary | ICD-10-CM | POA: Diagnosis not present

## 2022-10-01 DIAGNOSIS — M6281 Muscle weakness (generalized): Secondary | ICD-10-CM | POA: Diagnosis not present

## 2022-10-01 DIAGNOSIS — R471 Dysarthria and anarthria: Secondary | ICD-10-CM | POA: Diagnosis not present

## 2022-10-01 DIAGNOSIS — R41841 Cognitive communication deficit: Secondary | ICD-10-CM | POA: Diagnosis not present

## 2022-10-05 DIAGNOSIS — R41841 Cognitive communication deficit: Secondary | ICD-10-CM | POA: Diagnosis not present

## 2022-10-05 DIAGNOSIS — M6281 Muscle weakness (generalized): Secondary | ICD-10-CM | POA: Diagnosis not present

## 2022-10-05 DIAGNOSIS — R2681 Unsteadiness on feet: Secondary | ICD-10-CM | POA: Diagnosis not present

## 2022-10-05 DIAGNOSIS — R29898 Other symptoms and signs involving the musculoskeletal system: Secondary | ICD-10-CM | POA: Diagnosis not present

## 2022-10-05 DIAGNOSIS — R471 Dysarthria and anarthria: Secondary | ICD-10-CM | POA: Diagnosis not present

## 2022-10-06 DIAGNOSIS — R471 Dysarthria and anarthria: Secondary | ICD-10-CM | POA: Diagnosis not present

## 2022-10-06 DIAGNOSIS — R29898 Other symptoms and signs involving the musculoskeletal system: Secondary | ICD-10-CM | POA: Diagnosis not present

## 2022-10-06 DIAGNOSIS — R41841 Cognitive communication deficit: Secondary | ICD-10-CM | POA: Diagnosis not present

## 2022-10-06 DIAGNOSIS — M6281 Muscle weakness (generalized): Secondary | ICD-10-CM | POA: Diagnosis not present

## 2022-10-06 DIAGNOSIS — R2681 Unsteadiness on feet: Secondary | ICD-10-CM | POA: Diagnosis not present

## 2022-10-08 DIAGNOSIS — R471 Dysarthria and anarthria: Secondary | ICD-10-CM | POA: Diagnosis not present

## 2022-10-08 DIAGNOSIS — R41841 Cognitive communication deficit: Secondary | ICD-10-CM | POA: Diagnosis not present

## 2022-10-08 DIAGNOSIS — M6281 Muscle weakness (generalized): Secondary | ICD-10-CM | POA: Diagnosis not present

## 2022-10-08 DIAGNOSIS — R2681 Unsteadiness on feet: Secondary | ICD-10-CM | POA: Diagnosis not present

## 2022-10-08 DIAGNOSIS — R29898 Other symptoms and signs involving the musculoskeletal system: Secondary | ICD-10-CM | POA: Diagnosis not present

## 2022-10-13 DIAGNOSIS — R471 Dysarthria and anarthria: Secondary | ICD-10-CM | POA: Diagnosis not present

## 2022-10-13 DIAGNOSIS — R41841 Cognitive communication deficit: Secondary | ICD-10-CM | POA: Diagnosis not present

## 2022-10-13 DIAGNOSIS — M6281 Muscle weakness (generalized): Secondary | ICD-10-CM | POA: Diagnosis not present

## 2022-10-13 DIAGNOSIS — R2681 Unsteadiness on feet: Secondary | ICD-10-CM | POA: Diagnosis not present

## 2022-10-13 DIAGNOSIS — R29898 Other symptoms and signs involving the musculoskeletal system: Secondary | ICD-10-CM | POA: Diagnosis not present

## 2022-10-15 DIAGNOSIS — R2681 Unsteadiness on feet: Secondary | ICD-10-CM | POA: Diagnosis not present

## 2022-10-15 DIAGNOSIS — M6281 Muscle weakness (generalized): Secondary | ICD-10-CM | POA: Diagnosis not present

## 2022-10-15 DIAGNOSIS — R41841 Cognitive communication deficit: Secondary | ICD-10-CM | POA: Diagnosis not present

## 2022-10-15 DIAGNOSIS — R471 Dysarthria and anarthria: Secondary | ICD-10-CM | POA: Diagnosis not present

## 2022-10-15 DIAGNOSIS — R29898 Other symptoms and signs involving the musculoskeletal system: Secondary | ICD-10-CM | POA: Diagnosis not present

## 2022-10-16 DIAGNOSIS — R471 Dysarthria and anarthria: Secondary | ICD-10-CM | POA: Diagnosis not present

## 2022-10-16 DIAGNOSIS — R29898 Other symptoms and signs involving the musculoskeletal system: Secondary | ICD-10-CM | POA: Diagnosis not present

## 2022-10-16 DIAGNOSIS — M6281 Muscle weakness (generalized): Secondary | ICD-10-CM | POA: Diagnosis not present

## 2022-10-16 DIAGNOSIS — R41841 Cognitive communication deficit: Secondary | ICD-10-CM | POA: Diagnosis not present

## 2022-10-16 DIAGNOSIS — R2681 Unsteadiness on feet: Secondary | ICD-10-CM | POA: Diagnosis not present

## 2022-10-19 DIAGNOSIS — R41841 Cognitive communication deficit: Secondary | ICD-10-CM | POA: Diagnosis not present

## 2022-10-19 DIAGNOSIS — R29898 Other symptoms and signs involving the musculoskeletal system: Secondary | ICD-10-CM | POA: Diagnosis not present

## 2022-10-19 DIAGNOSIS — R471 Dysarthria and anarthria: Secondary | ICD-10-CM | POA: Diagnosis not present

## 2022-10-19 DIAGNOSIS — R2681 Unsteadiness on feet: Secondary | ICD-10-CM | POA: Diagnosis not present

## 2022-10-19 DIAGNOSIS — M6281 Muscle weakness (generalized): Secondary | ICD-10-CM | POA: Diagnosis not present

## 2022-10-20 DIAGNOSIS — R2681 Unsteadiness on feet: Secondary | ICD-10-CM | POA: Diagnosis not present

## 2022-10-20 DIAGNOSIS — R41841 Cognitive communication deficit: Secondary | ICD-10-CM | POA: Diagnosis not present

## 2022-10-20 DIAGNOSIS — M6281 Muscle weakness (generalized): Secondary | ICD-10-CM | POA: Diagnosis not present

## 2022-10-20 DIAGNOSIS — R29898 Other symptoms and signs involving the musculoskeletal system: Secondary | ICD-10-CM | POA: Diagnosis not present

## 2022-10-20 DIAGNOSIS — R471 Dysarthria and anarthria: Secondary | ICD-10-CM | POA: Diagnosis not present

## 2022-10-21 DIAGNOSIS — R41841 Cognitive communication deficit: Secondary | ICD-10-CM | POA: Diagnosis not present

## 2022-10-21 DIAGNOSIS — R29898 Other symptoms and signs involving the musculoskeletal system: Secondary | ICD-10-CM | POA: Diagnosis not present

## 2022-10-21 DIAGNOSIS — R471 Dysarthria and anarthria: Secondary | ICD-10-CM | POA: Diagnosis not present

## 2022-10-21 DIAGNOSIS — R2681 Unsteadiness on feet: Secondary | ICD-10-CM | POA: Diagnosis not present

## 2022-10-21 DIAGNOSIS — M6281 Muscle weakness (generalized): Secondary | ICD-10-CM | POA: Diagnosis not present

## 2022-10-22 DIAGNOSIS — R471 Dysarthria and anarthria: Secondary | ICD-10-CM | POA: Diagnosis not present

## 2022-10-22 DIAGNOSIS — R41841 Cognitive communication deficit: Secondary | ICD-10-CM | POA: Diagnosis not present

## 2022-10-22 DIAGNOSIS — M6281 Muscle weakness (generalized): Secondary | ICD-10-CM | POA: Diagnosis not present

## 2022-10-22 DIAGNOSIS — R29898 Other symptoms and signs involving the musculoskeletal system: Secondary | ICD-10-CM | POA: Diagnosis not present

## 2022-10-22 DIAGNOSIS — R2681 Unsteadiness on feet: Secondary | ICD-10-CM | POA: Diagnosis not present

## 2022-10-26 DIAGNOSIS — R471 Dysarthria and anarthria: Secondary | ICD-10-CM | POA: Diagnosis not present

## 2022-10-26 DIAGNOSIS — M6281 Muscle weakness (generalized): Secondary | ICD-10-CM | POA: Diagnosis not present

## 2022-10-26 DIAGNOSIS — R2681 Unsteadiness on feet: Secondary | ICD-10-CM | POA: Diagnosis not present

## 2022-10-26 DIAGNOSIS — R41841 Cognitive communication deficit: Secondary | ICD-10-CM | POA: Diagnosis not present

## 2022-10-26 DIAGNOSIS — R29898 Other symptoms and signs involving the musculoskeletal system: Secondary | ICD-10-CM | POA: Diagnosis not present

## 2022-10-27 DIAGNOSIS — R41841 Cognitive communication deficit: Secondary | ICD-10-CM | POA: Diagnosis not present

## 2022-10-27 DIAGNOSIS — R2681 Unsteadiness on feet: Secondary | ICD-10-CM | POA: Diagnosis not present

## 2022-10-27 DIAGNOSIS — R471 Dysarthria and anarthria: Secondary | ICD-10-CM | POA: Diagnosis not present

## 2022-10-27 DIAGNOSIS — R29898 Other symptoms and signs involving the musculoskeletal system: Secondary | ICD-10-CM | POA: Diagnosis not present

## 2022-10-27 DIAGNOSIS — M6281 Muscle weakness (generalized): Secondary | ICD-10-CM | POA: Diagnosis not present

## 2022-10-28 DIAGNOSIS — R41841 Cognitive communication deficit: Secondary | ICD-10-CM | POA: Diagnosis not present

## 2022-10-28 DIAGNOSIS — M6281 Muscle weakness (generalized): Secondary | ICD-10-CM | POA: Diagnosis not present

## 2022-10-28 DIAGNOSIS — R2681 Unsteadiness on feet: Secondary | ICD-10-CM | POA: Diagnosis not present

## 2022-10-28 DIAGNOSIS — R29898 Other symptoms and signs involving the musculoskeletal system: Secondary | ICD-10-CM | POA: Diagnosis not present

## 2022-10-28 DIAGNOSIS — R471 Dysarthria and anarthria: Secondary | ICD-10-CM | POA: Diagnosis not present

## 2022-10-29 DIAGNOSIS — R2681 Unsteadiness on feet: Secondary | ICD-10-CM | POA: Diagnosis not present

## 2022-10-29 DIAGNOSIS — R41841 Cognitive communication deficit: Secondary | ICD-10-CM | POA: Diagnosis not present

## 2022-10-29 DIAGNOSIS — R471 Dysarthria and anarthria: Secondary | ICD-10-CM | POA: Diagnosis not present

## 2022-10-29 DIAGNOSIS — R29898 Other symptoms and signs involving the musculoskeletal system: Secondary | ICD-10-CM | POA: Diagnosis not present

## 2022-10-29 DIAGNOSIS — M6281 Muscle weakness (generalized): Secondary | ICD-10-CM | POA: Diagnosis not present

## 2022-11-02 DIAGNOSIS — M6281 Muscle weakness (generalized): Secondary | ICD-10-CM | POA: Diagnosis not present

## 2022-11-02 DIAGNOSIS — R471 Dysarthria and anarthria: Secondary | ICD-10-CM | POA: Diagnosis not present

## 2022-11-02 DIAGNOSIS — R29898 Other symptoms and signs involving the musculoskeletal system: Secondary | ICD-10-CM | POA: Diagnosis not present

## 2022-11-02 DIAGNOSIS — R41841 Cognitive communication deficit: Secondary | ICD-10-CM | POA: Diagnosis not present

## 2022-11-02 DIAGNOSIS — R2681 Unsteadiness on feet: Secondary | ICD-10-CM | POA: Diagnosis not present

## 2022-11-03 DIAGNOSIS — R471 Dysarthria and anarthria: Secondary | ICD-10-CM | POA: Diagnosis not present

## 2022-11-03 DIAGNOSIS — R29898 Other symptoms and signs involving the musculoskeletal system: Secondary | ICD-10-CM | POA: Diagnosis not present

## 2022-11-03 DIAGNOSIS — R2681 Unsteadiness on feet: Secondary | ICD-10-CM | POA: Diagnosis not present

## 2022-11-03 DIAGNOSIS — M6281 Muscle weakness (generalized): Secondary | ICD-10-CM | POA: Diagnosis not present

## 2022-11-03 DIAGNOSIS — R41841 Cognitive communication deficit: Secondary | ICD-10-CM | POA: Diagnosis not present

## 2022-11-04 DIAGNOSIS — R471 Dysarthria and anarthria: Secondary | ICD-10-CM | POA: Diagnosis not present

## 2022-11-04 DIAGNOSIS — R41841 Cognitive communication deficit: Secondary | ICD-10-CM | POA: Diagnosis not present

## 2022-11-04 DIAGNOSIS — R29898 Other symptoms and signs involving the musculoskeletal system: Secondary | ICD-10-CM | POA: Diagnosis not present

## 2022-11-04 DIAGNOSIS — M6281 Muscle weakness (generalized): Secondary | ICD-10-CM | POA: Diagnosis not present

## 2022-11-04 DIAGNOSIS — R2681 Unsteadiness on feet: Secondary | ICD-10-CM | POA: Diagnosis not present

## 2022-11-05 DIAGNOSIS — R41841 Cognitive communication deficit: Secondary | ICD-10-CM | POA: Diagnosis not present

## 2022-11-05 DIAGNOSIS — R29898 Other symptoms and signs involving the musculoskeletal system: Secondary | ICD-10-CM | POA: Diagnosis not present

## 2022-11-05 DIAGNOSIS — R2681 Unsteadiness on feet: Secondary | ICD-10-CM | POA: Diagnosis not present

## 2022-11-05 DIAGNOSIS — M6281 Muscle weakness (generalized): Secondary | ICD-10-CM | POA: Diagnosis not present

## 2022-11-05 DIAGNOSIS — R471 Dysarthria and anarthria: Secondary | ICD-10-CM | POA: Diagnosis not present

## 2022-11-09 DIAGNOSIS — M6281 Muscle weakness (generalized): Secondary | ICD-10-CM | POA: Diagnosis not present

## 2022-11-09 DIAGNOSIS — R29898 Other symptoms and signs involving the musculoskeletal system: Secondary | ICD-10-CM | POA: Diagnosis not present

## 2022-11-09 DIAGNOSIS — R2681 Unsteadiness on feet: Secondary | ICD-10-CM | POA: Diagnosis not present

## 2022-11-09 DIAGNOSIS — R471 Dysarthria and anarthria: Secondary | ICD-10-CM | POA: Diagnosis not present

## 2022-11-09 DIAGNOSIS — R41841 Cognitive communication deficit: Secondary | ICD-10-CM | POA: Diagnosis not present

## 2022-11-10 DIAGNOSIS — R471 Dysarthria and anarthria: Secondary | ICD-10-CM | POA: Diagnosis not present

## 2022-11-10 DIAGNOSIS — R2681 Unsteadiness on feet: Secondary | ICD-10-CM | POA: Diagnosis not present

## 2022-11-10 DIAGNOSIS — R29898 Other symptoms and signs involving the musculoskeletal system: Secondary | ICD-10-CM | POA: Diagnosis not present

## 2022-11-10 DIAGNOSIS — M6281 Muscle weakness (generalized): Secondary | ICD-10-CM | POA: Diagnosis not present

## 2022-11-10 DIAGNOSIS — R41841 Cognitive communication deficit: Secondary | ICD-10-CM | POA: Diagnosis not present

## 2022-11-11 DIAGNOSIS — M6281 Muscle weakness (generalized): Secondary | ICD-10-CM | POA: Diagnosis not present

## 2022-11-11 DIAGNOSIS — R2681 Unsteadiness on feet: Secondary | ICD-10-CM | POA: Diagnosis not present

## 2022-11-11 DIAGNOSIS — R41841 Cognitive communication deficit: Secondary | ICD-10-CM | POA: Diagnosis not present

## 2022-11-11 DIAGNOSIS — R471 Dysarthria and anarthria: Secondary | ICD-10-CM | POA: Diagnosis not present

## 2022-11-11 DIAGNOSIS — R29898 Other symptoms and signs involving the musculoskeletal system: Secondary | ICD-10-CM | POA: Diagnosis not present

## 2022-11-12 DIAGNOSIS — R41841 Cognitive communication deficit: Secondary | ICD-10-CM | POA: Diagnosis not present

## 2022-11-12 DIAGNOSIS — R2681 Unsteadiness on feet: Secondary | ICD-10-CM | POA: Diagnosis not present

## 2022-11-12 DIAGNOSIS — M6281 Muscle weakness (generalized): Secondary | ICD-10-CM | POA: Diagnosis not present

## 2022-11-12 DIAGNOSIS — R29898 Other symptoms and signs involving the musculoskeletal system: Secondary | ICD-10-CM | POA: Diagnosis not present

## 2022-11-12 DIAGNOSIS — R471 Dysarthria and anarthria: Secondary | ICD-10-CM | POA: Diagnosis not present

## 2022-11-17 DIAGNOSIS — R471 Dysarthria and anarthria: Secondary | ICD-10-CM | POA: Diagnosis not present

## 2022-11-17 DIAGNOSIS — R2681 Unsteadiness on feet: Secondary | ICD-10-CM | POA: Diagnosis not present

## 2022-11-17 DIAGNOSIS — R41841 Cognitive communication deficit: Secondary | ICD-10-CM | POA: Diagnosis not present

## 2022-11-17 DIAGNOSIS — R29898 Other symptoms and signs involving the musculoskeletal system: Secondary | ICD-10-CM | POA: Diagnosis not present

## 2022-11-17 DIAGNOSIS — M6281 Muscle weakness (generalized): Secondary | ICD-10-CM | POA: Diagnosis not present

## 2022-11-18 DIAGNOSIS — M6281 Muscle weakness (generalized): Secondary | ICD-10-CM | POA: Diagnosis not present

## 2022-11-18 DIAGNOSIS — R41841 Cognitive communication deficit: Secondary | ICD-10-CM | POA: Diagnosis not present

## 2022-11-18 DIAGNOSIS — R2681 Unsteadiness on feet: Secondary | ICD-10-CM | POA: Diagnosis not present

## 2022-11-18 DIAGNOSIS — R471 Dysarthria and anarthria: Secondary | ICD-10-CM | POA: Diagnosis not present

## 2022-11-18 DIAGNOSIS — R29898 Other symptoms and signs involving the musculoskeletal system: Secondary | ICD-10-CM | POA: Diagnosis not present

## 2022-11-19 DIAGNOSIS — R29898 Other symptoms and signs involving the musculoskeletal system: Secondary | ICD-10-CM | POA: Diagnosis not present

## 2022-11-19 DIAGNOSIS — R471 Dysarthria and anarthria: Secondary | ICD-10-CM | POA: Diagnosis not present

## 2022-11-19 DIAGNOSIS — R41841 Cognitive communication deficit: Secondary | ICD-10-CM | POA: Diagnosis not present

## 2022-11-19 DIAGNOSIS — R2681 Unsteadiness on feet: Secondary | ICD-10-CM | POA: Diagnosis not present

## 2022-11-19 DIAGNOSIS — M6281 Muscle weakness (generalized): Secondary | ICD-10-CM | POA: Diagnosis not present

## 2022-11-20 DIAGNOSIS — R41841 Cognitive communication deficit: Secondary | ICD-10-CM | POA: Diagnosis not present

## 2022-11-20 DIAGNOSIS — R2681 Unsteadiness on feet: Secondary | ICD-10-CM | POA: Diagnosis not present

## 2022-11-20 DIAGNOSIS — R29898 Other symptoms and signs involving the musculoskeletal system: Secondary | ICD-10-CM | POA: Diagnosis not present

## 2022-11-20 DIAGNOSIS — R471 Dysarthria and anarthria: Secondary | ICD-10-CM | POA: Diagnosis not present

## 2022-11-20 DIAGNOSIS — M6281 Muscle weakness (generalized): Secondary | ICD-10-CM | POA: Diagnosis not present

## 2022-11-24 DIAGNOSIS — R29898 Other symptoms and signs involving the musculoskeletal system: Secondary | ICD-10-CM | POA: Diagnosis not present

## 2022-11-24 DIAGNOSIS — M6281 Muscle weakness (generalized): Secondary | ICD-10-CM | POA: Diagnosis not present

## 2022-11-24 DIAGNOSIS — R471 Dysarthria and anarthria: Secondary | ICD-10-CM | POA: Diagnosis not present

## 2022-11-24 DIAGNOSIS — R2681 Unsteadiness on feet: Secondary | ICD-10-CM | POA: Diagnosis not present

## 2022-11-24 DIAGNOSIS — R41841 Cognitive communication deficit: Secondary | ICD-10-CM | POA: Diagnosis not present

## 2022-11-25 DIAGNOSIS — R41841 Cognitive communication deficit: Secondary | ICD-10-CM | POA: Diagnosis not present

## 2022-11-25 DIAGNOSIS — R471 Dysarthria and anarthria: Secondary | ICD-10-CM | POA: Diagnosis not present

## 2022-11-25 DIAGNOSIS — R29898 Other symptoms and signs involving the musculoskeletal system: Secondary | ICD-10-CM | POA: Diagnosis not present

## 2022-11-25 DIAGNOSIS — M6281 Muscle weakness (generalized): Secondary | ICD-10-CM | POA: Diagnosis not present

## 2022-11-25 DIAGNOSIS — R2681 Unsteadiness on feet: Secondary | ICD-10-CM | POA: Diagnosis not present

## 2022-11-26 DIAGNOSIS — R471 Dysarthria and anarthria: Secondary | ICD-10-CM | POA: Diagnosis not present

## 2022-11-26 DIAGNOSIS — R41841 Cognitive communication deficit: Secondary | ICD-10-CM | POA: Diagnosis not present

## 2022-11-26 DIAGNOSIS — R29898 Other symptoms and signs involving the musculoskeletal system: Secondary | ICD-10-CM | POA: Diagnosis not present

## 2022-11-26 DIAGNOSIS — R2681 Unsteadiness on feet: Secondary | ICD-10-CM | POA: Diagnosis not present

## 2022-11-26 DIAGNOSIS — M6281 Muscle weakness (generalized): Secondary | ICD-10-CM | POA: Diagnosis not present

## 2022-11-30 DIAGNOSIS — M6281 Muscle weakness (generalized): Secondary | ICD-10-CM | POA: Diagnosis not present

## 2022-11-30 DIAGNOSIS — R29898 Other symptoms and signs involving the musculoskeletal system: Secondary | ICD-10-CM | POA: Diagnosis not present

## 2022-11-30 DIAGNOSIS — R2681 Unsteadiness on feet: Secondary | ICD-10-CM | POA: Diagnosis not present

## 2022-11-30 DIAGNOSIS — R471 Dysarthria and anarthria: Secondary | ICD-10-CM | POA: Diagnosis not present

## 2022-11-30 DIAGNOSIS — R41841 Cognitive communication deficit: Secondary | ICD-10-CM | POA: Diagnosis not present

## 2022-12-01 DIAGNOSIS — R471 Dysarthria and anarthria: Secondary | ICD-10-CM | POA: Diagnosis not present

## 2022-12-01 DIAGNOSIS — R29898 Other symptoms and signs involving the musculoskeletal system: Secondary | ICD-10-CM | POA: Diagnosis not present

## 2022-12-01 DIAGNOSIS — M6281 Muscle weakness (generalized): Secondary | ICD-10-CM | POA: Diagnosis not present

## 2022-12-01 DIAGNOSIS — R41841 Cognitive communication deficit: Secondary | ICD-10-CM | POA: Diagnosis not present

## 2022-12-01 DIAGNOSIS — R2681 Unsteadiness on feet: Secondary | ICD-10-CM | POA: Diagnosis not present

## 2022-12-02 DIAGNOSIS — R41841 Cognitive communication deficit: Secondary | ICD-10-CM | POA: Diagnosis not present

## 2022-12-02 DIAGNOSIS — R471 Dysarthria and anarthria: Secondary | ICD-10-CM | POA: Diagnosis not present

## 2022-12-02 DIAGNOSIS — R29898 Other symptoms and signs involving the musculoskeletal system: Secondary | ICD-10-CM | POA: Diagnosis not present

## 2022-12-02 DIAGNOSIS — M6281 Muscle weakness (generalized): Secondary | ICD-10-CM | POA: Diagnosis not present

## 2022-12-02 DIAGNOSIS — R2681 Unsteadiness on feet: Secondary | ICD-10-CM | POA: Diagnosis not present

## 2022-12-03 DIAGNOSIS — R471 Dysarthria and anarthria: Secondary | ICD-10-CM | POA: Diagnosis not present

## 2022-12-03 DIAGNOSIS — R2681 Unsteadiness on feet: Secondary | ICD-10-CM | POA: Diagnosis not present

## 2022-12-03 DIAGNOSIS — R41841 Cognitive communication deficit: Secondary | ICD-10-CM | POA: Diagnosis not present

## 2022-12-03 DIAGNOSIS — M6281 Muscle weakness (generalized): Secondary | ICD-10-CM | POA: Diagnosis not present

## 2022-12-03 DIAGNOSIS — R29898 Other symptoms and signs involving the musculoskeletal system: Secondary | ICD-10-CM | POA: Diagnosis not present

## 2022-12-08 DIAGNOSIS — M6281 Muscle weakness (generalized): Secondary | ICD-10-CM | POA: Diagnosis not present

## 2022-12-08 DIAGNOSIS — R471 Dysarthria and anarthria: Secondary | ICD-10-CM | POA: Diagnosis not present

## 2022-12-08 DIAGNOSIS — R29898 Other symptoms and signs involving the musculoskeletal system: Secondary | ICD-10-CM | POA: Diagnosis not present

## 2022-12-08 DIAGNOSIS — R41841 Cognitive communication deficit: Secondary | ICD-10-CM | POA: Diagnosis not present

## 2022-12-08 DIAGNOSIS — R2681 Unsteadiness on feet: Secondary | ICD-10-CM | POA: Diagnosis not present

## 2022-12-10 DIAGNOSIS — M6281 Muscle weakness (generalized): Secondary | ICD-10-CM | POA: Diagnosis not present

## 2022-12-10 DIAGNOSIS — R471 Dysarthria and anarthria: Secondary | ICD-10-CM | POA: Diagnosis not present

## 2022-12-10 DIAGNOSIS — R2681 Unsteadiness on feet: Secondary | ICD-10-CM | POA: Diagnosis not present

## 2022-12-10 DIAGNOSIS — R41841 Cognitive communication deficit: Secondary | ICD-10-CM | POA: Diagnosis not present

## 2022-12-10 DIAGNOSIS — R29898 Other symptoms and signs involving the musculoskeletal system: Secondary | ICD-10-CM | POA: Diagnosis not present

## 2022-12-15 DIAGNOSIS — M6281 Muscle weakness (generalized): Secondary | ICD-10-CM | POA: Diagnosis not present

## 2022-12-15 DIAGNOSIS — R2681 Unsteadiness on feet: Secondary | ICD-10-CM | POA: Diagnosis not present

## 2022-12-15 DIAGNOSIS — R29898 Other symptoms and signs involving the musculoskeletal system: Secondary | ICD-10-CM | POA: Diagnosis not present

## 2022-12-15 DIAGNOSIS — R471 Dysarthria and anarthria: Secondary | ICD-10-CM | POA: Diagnosis not present

## 2022-12-15 DIAGNOSIS — R41841 Cognitive communication deficit: Secondary | ICD-10-CM | POA: Diagnosis not present

## 2022-12-16 DIAGNOSIS — R471 Dysarthria and anarthria: Secondary | ICD-10-CM | POA: Diagnosis not present

## 2022-12-16 DIAGNOSIS — R41841 Cognitive communication deficit: Secondary | ICD-10-CM | POA: Diagnosis not present

## 2022-12-21 DIAGNOSIS — I639 Cerebral infarction, unspecified: Secondary | ICD-10-CM | POA: Diagnosis not present

## 2022-12-21 DIAGNOSIS — E785 Hyperlipidemia, unspecified: Secondary | ICD-10-CM | POA: Diagnosis not present

## 2022-12-21 DIAGNOSIS — I10 Essential (primary) hypertension: Secondary | ICD-10-CM | POA: Diagnosis not present

## 2022-12-21 DIAGNOSIS — J302 Other seasonal allergic rhinitis: Secondary | ICD-10-CM | POA: Diagnosis not present

## 2022-12-21 DIAGNOSIS — N318 Other neuromuscular dysfunction of bladder: Secondary | ICD-10-CM | POA: Diagnosis not present

## 2022-12-21 DIAGNOSIS — M810 Age-related osteoporosis without current pathological fracture: Secondary | ICD-10-CM | POA: Diagnosis not present

## 2022-12-21 DIAGNOSIS — K219 Gastro-esophageal reflux disease without esophagitis: Secondary | ICD-10-CM | POA: Diagnosis not present

## 2022-12-21 DIAGNOSIS — I872 Venous insufficiency (chronic) (peripheral): Secondary | ICD-10-CM | POA: Diagnosis not present

## 2022-12-21 DIAGNOSIS — M1990 Unspecified osteoarthritis, unspecified site: Secondary | ICD-10-CM | POA: Diagnosis not present

## 2022-12-21 DIAGNOSIS — K429 Umbilical hernia without obstruction or gangrene: Secondary | ICD-10-CM | POA: Diagnosis not present

## 2022-12-22 DIAGNOSIS — R41841 Cognitive communication deficit: Secondary | ICD-10-CM | POA: Diagnosis not present

## 2022-12-22 DIAGNOSIS — R471 Dysarthria and anarthria: Secondary | ICD-10-CM | POA: Diagnosis not present

## 2022-12-24 DIAGNOSIS — R41841 Cognitive communication deficit: Secondary | ICD-10-CM | POA: Diagnosis not present

## 2022-12-24 DIAGNOSIS — R471 Dysarthria and anarthria: Secondary | ICD-10-CM | POA: Diagnosis not present

## 2022-12-29 DIAGNOSIS — R41841 Cognitive communication deficit: Secondary | ICD-10-CM | POA: Diagnosis not present

## 2022-12-29 DIAGNOSIS — R471 Dysarthria and anarthria: Secondary | ICD-10-CM | POA: Diagnosis not present

## 2022-12-30 DIAGNOSIS — R471 Dysarthria and anarthria: Secondary | ICD-10-CM | POA: Diagnosis not present

## 2022-12-30 DIAGNOSIS — R41841 Cognitive communication deficit: Secondary | ICD-10-CM | POA: Diagnosis not present

## 2023-01-04 DIAGNOSIS — R41841 Cognitive communication deficit: Secondary | ICD-10-CM | POA: Diagnosis not present

## 2023-01-04 DIAGNOSIS — R471 Dysarthria and anarthria: Secondary | ICD-10-CM | POA: Diagnosis not present

## 2023-01-06 ENCOUNTER — Encounter: Payer: Self-pay | Admitting: Adult Health

## 2023-01-06 ENCOUNTER — Non-Acute Institutional Stay: Payer: Medicare Other | Admitting: Adult Health

## 2023-01-06 DIAGNOSIS — K219 Gastro-esophageal reflux disease without esophagitis: Secondary | ICD-10-CM | POA: Diagnosis not present

## 2023-01-06 DIAGNOSIS — Z8673 Personal history of transient ischemic attack (TIA), and cerebral infarction without residual deficits: Secondary | ICD-10-CM | POA: Diagnosis not present

## 2023-01-06 DIAGNOSIS — I10 Essential (primary) hypertension: Secondary | ICD-10-CM

## 2023-01-06 DIAGNOSIS — E785 Hyperlipidemia, unspecified: Secondary | ICD-10-CM

## 2023-01-06 NOTE — Progress Notes (Signed)
Location:  [facility] Room Number: 918 Place of Service:  ALF (13) Provider:  Kenard Gower, DNP, FNP-BC  Patient Care Team: Chilton Greathouse, MD as PCP - General (Internal Medicine)  Extended Emergency Contact Information Primary Emergency Contact: Jaquelyn Bitter of [place] Home Phone: [phone] Mobile Phone: [phone] Relation: Nephew Secondary Emergency Contact: Ferne Coe [place] of [place] Home Phone: [phone] Relation: Other  Code Status:  Full Code  Goals of care: Advanced Directive information    01/06/2023   10:30 AM  Advanced Directives  Does Patient Have a Medical Advance Directive? No  Would patient like information on creating a medical advance directive? No - Patient declined     Chief Complaint  Patient presents with   Establish Care    transfer of care to [facility]    HPI:  Pt is a 89 y.o. female seen today to establish care with [facility]. She is a resident of [facility].  History of CVA (cerebrovascular accident) -  she has dysarthria and uses an Mining engineer wheelchair., takes Plavix and ASA  Primary hypertension  -  BP 108/65, takes Olmesartan Medoximil  Olmesartan  Hyperlipidemia, unspecified hyperlipidemia type - takes Simvastatin    Past Medical History:  Diagnosis Date   Arthritis    Edema    bilateral ankles   History of urethral stricture    freq/ urge   HOH (hard of hearing)    both ears   Hypertension    elevates legs    Urethral stricture    Urinary retention    Wears hearing aid    Past Surgical History:  Procedure Laterality Date   CYSTO/ URETHRAL DILATION/ FULGERATION OF BLEEDER  10-16-10   CYSTOSCOPY WITH URETHRAL DILATATION  07/02/2011   Procedure: CYSTOSCOPY WITH URETHRAL DILATATION;  Surgeon: Anner Crete;  Location: [facility];  Service: Urology;  Laterality: N/A;   CYSTOSCOPY WITH URETHRAL DILATATION N/A 08/24/2013   Procedure:  CYSTOSCOPY WITH URETHRAL DILATATION;  Surgeon: Bjorn Pippin, MD;  Location: [facility];  Service: Urology;  Laterality: N/A;   CYSTOSCOPY WITH URETHRAL DILATATION N/A 05/02/2015   Procedure: CYSTOSCOPY WITH URETHRAL DILATATION, FULGURATION BLADDER NECK;  Surgeon: Bjorn Pippin, MD;  Location: [facility];  Service: Urology;  Laterality: N/A;   CYSTOSCOPY WITH URETHRAL DILATATION N/A 08/03/2017   Procedure: CYSTOSCOPY WITH URETHRAL DILATATION;  Surgeon: Bjorn Pippin, MD;  Location: [facility];  Service: Urology;  Laterality: N/A;   ORIF ORBITAL FRACTURE Right 07/29/2015   Procedure: RIGHT ORBITAL FLOOR REPAIR ;  Surgeon: Christia Reading, MD;  Location: [facility];  Service: ENT;  Laterality: Right;    Allergies  Allergen Reactions   Furosemide Other (See Comments)    Excessive Diarrhea.    Outpatient Encounter Medications as of 01/06/2023  Medication Sig   acetaminophen (TYLENOL) 325 MG tablet Take 325 mg by mouth every 6 (six) hours as needed for headache.   aspirin 81 MG tablet Take 81 mg by mouth at bedtime.   Calcium Citrate-Vitamin D (CALCIUM CITRATE +) 315-5 MG-MCG TABS Take 1 tablet by mouth daily.   Cholecalciferol 50 MCG (2000 UT) TABS Take 1 tablet by mouth daily.   clopidogrel (PLAVIX) 75 MG tablet Take 75 mg by mouth daily.   diclofenac sodium (VOLTAREN) 1 % GEL Apply topically as needed.   Multiple Vitamin (MULTIVITAMIN WITH MINERALS) TABS tablet Take 1 tablet by mouth daily.   olmesartan-hydrochlorothiazide (BENICAR HCT)  20-12.5 MG tablet Take 1 tablet by mouth daily.   omeprazole (PRILOSEC) 40 MG capsule Take 40 mg by mouth daily.   simvastatin (ZOCOR) 20 MG tablet Take 20 mg by mouth at bedtime.     [DISCONTINUED] calcium citrate-vitamin D 200-200 MG-UNIT TABS Take 1 tablet by mouth 3 (three) times daily.    [DISCONTINUED] UNABLE TO FIND Vitamin d 3 2000 iu   No facility-administered encounter medications on file as of  01/06/2023.    Review of Systems  Constitutional:  Negative for appetite change, chills, fatigue and fever.  HENT:  Negative for congestion, hearing loss, rhinorrhea and sore throat.   Eyes: Negative.   Respiratory:  Negative for cough, shortness of breath and wheezing.   Cardiovascular:  Negative for chest pain, palpitations and leg swelling.  Gastrointestinal:  Negative for abdominal pain, constipation, diarrhea, nausea and vomiting.  Genitourinary:  Negative for dysuria.  Musculoskeletal:  Negative for arthralgias, back pain and myalgias.  Skin:  Negative for color change, rash and wound.  Neurological:  Positive for weakness. Negative for dizziness and headaches.       Uses electric wheelchair  Psychiatric/Behavioral:  Negative for behavioral problems. The patient is not nervous/anxious.        Immunization History  Administered Date(s) Administered   DTaP 02/20/2008   Influenza-Unspecified 05/17/2010   Pneumococcal-Unspecified 12/15/2005   Tdap 07/15/2015   Pertinent  Health Maintenance Due  Topic Date Due   DEXA SCAN  Never done   INFLUENZA VACCINE  03/18/2023      12/24/2020   10:29 AM  Fall Risk  (RETIRED) Patient Fall Risk Level High fall risk     Vitals:   01/06/23 1026  BP: 108/65  Pulse: 77  Resp: 16  Temp: 98 F (36.7 C)  SpO2: 98%  Weight: 135 lb 12.8 oz (61.6 kg)   Body mass index is 24.84 kg/m.  Physical Exam Constitutional:      Appearance: Normal appearance.  HENT:     Head: Normocephalic and atraumatic.     Nose: Nose normal.     Mouth/Throat:     Mouth: Mucous membranes are moist.  Eyes:     Conjunctiva/sclera: Conjunctivae normal.  Cardiovascular:     Rate and Rhythm: Normal rate and regular rhythm.  Pulmonary:     Effort: Pulmonary effort is normal.     Breath sounds: Normal breath sounds.  Abdominal:     General: Bowel sounds are normal.     Palpations: Abdomen is soft.  Musculoskeletal:        General: Normal range of  motion.     Cervical back: Normal range of motion.  Skin:    General: Skin is warm and dry.  Neurological:     Mental Status: She is alert. Mental status is at baseline.     Motor: Weakness present.     Comments: Slurred speech RUE weakness  Psychiatric:        Mood and Affect: Mood normal.        Behavior: Behavior normal.        Thought Content: Thought content normal.        Judgment: Judgment normal.        Labs reviewed: No results for input(s): "NA", "K", "CL", "CO2", "GLUCOSE", "BUN", "CREATININE", "CALCIUM", "MG", "PHOS" in the last 8760 hours. No results for input(s): "AST", "ALT", "ALKPHOS", "BILITOT", "PROT", "ALBUMIN" in the last 8760 hours. No results for input(s): "WBC", "NEUTROABS", "HGB", "HCT", "MCV", "PLT" in the last  8760 hours. No results found for: "TSH" No results found for: "HGBA1C" No results found for: "CHOL", "HDL", "LDLCALC", "LDLDIRECT", "TRIG", "CHOLHDL"  Significant Diagnostic Results in last 30 days:  No results found.  Assessment/Plan  1. History of CVA (cerebrovascular accident) -  has slurred speech and uses electric wheelchair - continue Plavix, ASA and Simvastatin  2. Primary hypertension -  BPs stable -  continue Olmesartan Medoximil HCTZ -   monitor BP  3. Hyperlipidemia, unspecified hyperlipidemia type -  continue Simvastatin  4. GERD -  stable -  continue Omeprazole      Family/ staff Communication: Discussed plan of care with resident and charge nurse.  Labs/tests ordered:   None    Kenard Gower, DNP, MSN, FNP-BC [facility] [phone] (Monday-Friday 8:00 a.m. - 5:00 p.m.) [phone] (after hours)

## 2023-01-07 DIAGNOSIS — R471 Dysarthria and anarthria: Secondary | ICD-10-CM | POA: Diagnosis not present

## 2023-01-07 DIAGNOSIS — R41841 Cognitive communication deficit: Secondary | ICD-10-CM | POA: Diagnosis not present

## 2023-01-12 DIAGNOSIS — R41841 Cognitive communication deficit: Secondary | ICD-10-CM | POA: Diagnosis not present

## 2023-01-12 DIAGNOSIS — R471 Dysarthria and anarthria: Secondary | ICD-10-CM | POA: Diagnosis not present

## 2023-01-14 DIAGNOSIS — R41841 Cognitive communication deficit: Secondary | ICD-10-CM | POA: Diagnosis not present

## 2023-01-14 DIAGNOSIS — R471 Dysarthria and anarthria: Secondary | ICD-10-CM | POA: Diagnosis not present

## 2023-01-19 DIAGNOSIS — R471 Dysarthria and anarthria: Secondary | ICD-10-CM | POA: Diagnosis not present

## 2023-01-19 DIAGNOSIS — R41841 Cognitive communication deficit: Secondary | ICD-10-CM | POA: Diagnosis not present

## 2023-01-21 DIAGNOSIS — R471 Dysarthria and anarthria: Secondary | ICD-10-CM | POA: Diagnosis not present

## 2023-01-21 DIAGNOSIS — R41841 Cognitive communication deficit: Secondary | ICD-10-CM | POA: Diagnosis not present

## 2023-01-26 DIAGNOSIS — R41841 Cognitive communication deficit: Secondary | ICD-10-CM | POA: Diagnosis not present

## 2023-01-26 DIAGNOSIS — R471 Dysarthria and anarthria: Secondary | ICD-10-CM | POA: Diagnosis not present

## 2023-01-28 DIAGNOSIS — R41841 Cognitive communication deficit: Secondary | ICD-10-CM | POA: Diagnosis not present

## 2023-01-28 DIAGNOSIS — R471 Dysarthria and anarthria: Secondary | ICD-10-CM | POA: Diagnosis not present

## 2023-02-01 DIAGNOSIS — R41841 Cognitive communication deficit: Secondary | ICD-10-CM | POA: Diagnosis not present

## 2023-02-01 DIAGNOSIS — R471 Dysarthria and anarthria: Secondary | ICD-10-CM | POA: Diagnosis not present

## 2023-02-04 DIAGNOSIS — R471 Dysarthria and anarthria: Secondary | ICD-10-CM | POA: Diagnosis not present

## 2023-02-04 DIAGNOSIS — R41841 Cognitive communication deficit: Secondary | ICD-10-CM | POA: Diagnosis not present

## 2023-02-09 DIAGNOSIS — R41841 Cognitive communication deficit: Secondary | ICD-10-CM | POA: Diagnosis not present

## 2023-02-09 DIAGNOSIS — R471 Dysarthria and anarthria: Secondary | ICD-10-CM | POA: Diagnosis not present

## 2023-02-11 ENCOUNTER — Encounter: Payer: Self-pay | Admitting: Nurse Practitioner

## 2023-02-11 DIAGNOSIS — R41841 Cognitive communication deficit: Secondary | ICD-10-CM | POA: Diagnosis not present

## 2023-02-11 DIAGNOSIS — R471 Dysarthria and anarthria: Secondary | ICD-10-CM | POA: Diagnosis not present

## 2023-02-11 DIAGNOSIS — Z8673 Personal history of transient ischemic attack (TIA), and cerebral infarction without residual deficits: Secondary | ICD-10-CM | POA: Insufficient documentation

## 2023-02-11 HISTORY — DX: Personal history of transient ischemic attack (TIA), and cerebral infarction without residual deficits: Z86.73

## 2023-02-14 DIAGNOSIS — R41841 Cognitive communication deficit: Secondary | ICD-10-CM | POA: Diagnosis not present

## 2023-02-14 DIAGNOSIS — R471 Dysarthria and anarthria: Secondary | ICD-10-CM | POA: Diagnosis not present

## 2023-02-22 DIAGNOSIS — R41841 Cognitive communication deficit: Secondary | ICD-10-CM | POA: Diagnosis not present

## 2023-02-22 DIAGNOSIS — R1312 Dysphagia, oropharyngeal phase: Secondary | ICD-10-CM | POA: Diagnosis not present

## 2023-02-22 DIAGNOSIS — R471 Dysarthria and anarthria: Secondary | ICD-10-CM | POA: Diagnosis not present

## 2023-02-25 DIAGNOSIS — R41841 Cognitive communication deficit: Secondary | ICD-10-CM | POA: Diagnosis not present

## 2023-02-25 DIAGNOSIS — R471 Dysarthria and anarthria: Secondary | ICD-10-CM | POA: Diagnosis not present

## 2023-02-25 DIAGNOSIS — R1312 Dysphagia, oropharyngeal phase: Secondary | ICD-10-CM | POA: Diagnosis not present

## 2023-03-02 DIAGNOSIS — R41841 Cognitive communication deficit: Secondary | ICD-10-CM | POA: Diagnosis not present

## 2023-03-02 DIAGNOSIS — R1312 Dysphagia, oropharyngeal phase: Secondary | ICD-10-CM | POA: Diagnosis not present

## 2023-03-02 DIAGNOSIS — R471 Dysarthria and anarthria: Secondary | ICD-10-CM | POA: Diagnosis not present

## 2023-03-06 DIAGNOSIS — R1312 Dysphagia, oropharyngeal phase: Secondary | ICD-10-CM | POA: Diagnosis not present

## 2023-03-06 DIAGNOSIS — R41841 Cognitive communication deficit: Secondary | ICD-10-CM | POA: Diagnosis not present

## 2023-03-06 DIAGNOSIS — R471 Dysarthria and anarthria: Secondary | ICD-10-CM | POA: Diagnosis not present

## 2023-03-09 DIAGNOSIS — R1312 Dysphagia, oropharyngeal phase: Secondary | ICD-10-CM | POA: Diagnosis not present

## 2023-03-09 DIAGNOSIS — R41841 Cognitive communication deficit: Secondary | ICD-10-CM | POA: Diagnosis not present

## 2023-03-09 DIAGNOSIS — R471 Dysarthria and anarthria: Secondary | ICD-10-CM | POA: Diagnosis not present

## 2023-03-11 DIAGNOSIS — R471 Dysarthria and anarthria: Secondary | ICD-10-CM | POA: Diagnosis not present

## 2023-03-11 DIAGNOSIS — R1312 Dysphagia, oropharyngeal phase: Secondary | ICD-10-CM | POA: Diagnosis not present

## 2023-03-11 DIAGNOSIS — R41841 Cognitive communication deficit: Secondary | ICD-10-CM | POA: Diagnosis not present

## 2023-03-16 ENCOUNTER — Encounter: Payer: Self-pay | Admitting: Family Medicine

## 2023-03-16 ENCOUNTER — Non-Acute Institutional Stay: Payer: Medicare Other | Admitting: Family Medicine

## 2023-03-16 DIAGNOSIS — E782 Mixed hyperlipidemia: Secondary | ICD-10-CM | POA: Diagnosis not present

## 2023-03-16 DIAGNOSIS — R471 Dysarthria and anarthria: Secondary | ICD-10-CM

## 2023-03-16 DIAGNOSIS — E785 Hyperlipidemia, unspecified: Secondary | ICD-10-CM | POA: Insufficient documentation

## 2023-03-16 DIAGNOSIS — R1312 Dysphagia, oropharyngeal phase: Secondary | ICD-10-CM | POA: Diagnosis not present

## 2023-03-16 DIAGNOSIS — I10 Essential (primary) hypertension: Secondary | ICD-10-CM

## 2023-03-16 DIAGNOSIS — R41841 Cognitive communication deficit: Secondary | ICD-10-CM | POA: Diagnosis not present

## 2023-03-16 DIAGNOSIS — R609 Edema, unspecified: Secondary | ICD-10-CM | POA: Diagnosis not present

## 2023-03-16 NOTE — Progress Notes (Signed)
Provider:  Jacalyn Lefevre, MD Location:      Place of Service:     PCP: Chilton Greathouse, MD Patient Care Team: Chilton Greathouse, MD as PCP - General (Internal Medicine)  Extended Emergency Contact Information Primary Emergency Contact: Jaquelyn Bitter of [place] Home Phone: [phone] Mobile Phone: [phone] Relation: Nephew Secondary Emergency Contact: Ferne Coe [place] of [place] Home Phone: [phone] Relation: Other  Code Status:  Goals of Care: Advanced Directive information    01/06/2023   10:30 AM  Advanced Directives  Does Patient Have a Medical Advance Directive? No  Would patient like information on creating a medical advance directive? No - Patient declined      No chief complaint on file.   HPI: Patient is a 89 y.o. female seen today for admission to [facility] assisted [facility].  Her problems include history of cerebral infarction, hypertension, venous insufficiency, neuromuscular dysfunction of bladder, umbilical hernia, hyperlipidemia, GERD without esophagitis, and osteoporosis. Given this list of issues and problems her most serious disability is her inability to speak; characterized as dysarthria and anarthria. There is minimal history available in the electronic medical record since her primary care physician is not within the epic network. Several emergency room visits were related to falls.  She uses a motorized scooter as well as a walker for ambulation. Other problems discovered in limited history are urethral stricture dilated in 2018. Otherwise she has a history of hypertension treated with olmesartan, hyperlipidemia treated with simvastatin.  She takes Plavix and aspirin with history of CVA Past Medical History:  Diagnosis Date   Arthritis    Edema    bilateral ankles   History of urethral stricture    freq/ urge   HOH (hard of hearing)    both ears   Hypertension    elevates legs    Urethral stricture     Urinary retention    Wears hearing aid    Past Surgical History:  Procedure Laterality Date   CYSTO/ URETHRAL DILATION/ FULGERATION OF BLEEDER  10-16-10   CYSTOSCOPY WITH URETHRAL DILATATION  07/02/2011   Procedure: CYSTOSCOPY WITH URETHRAL DILATATION;  Surgeon: Anner Crete;  Location: [facility];  Service: Urology;  Laterality: N/A;   CYSTOSCOPY WITH URETHRAL DILATATION N/A 08/24/2013   Procedure: CYSTOSCOPY WITH URETHRAL DILATATION;  Surgeon: Bjorn Pippin, MD;  Location: [facility];  Service: Urology;  Laterality: N/A;   CYSTOSCOPY WITH URETHRAL DILATATION N/A 05/02/2015   Procedure: CYSTOSCOPY WITH URETHRAL DILATATION, FULGURATION BLADDER NECK;  Surgeon: Bjorn Pippin, MD;  Location: [facility];  Service: Urology;  Laterality: N/A;   CYSTOSCOPY WITH URETHRAL DILATATION N/A 08/03/2017   Procedure: CYSTOSCOPY WITH URETHRAL DILATATION;  Surgeon: Bjorn Pippin, MD;  Location: [facility];  Service: Urology;  Laterality: N/A;   ORIF ORBITAL FRACTURE Right 07/29/2015   Procedure: RIGHT ORBITAL FLOOR REPAIR ;  Surgeon: Christia Reading, MD;  Location: [facility];  Service: ENT;  Laterality: Right;    reports that she quit smoking about 58 years ago. Her smoking use included cigarettes. She started smoking about 60 years ago. She has never used smokeless tobacco. She reports that she does not drink alcohol and does not use drugs. Social History   Socioeconomic History   Marital status: Single    Spouse name: Not on file   Number of children: Not on file   Years of education: Not on file   Highest education level: Not on file  Occupational History   Not on file  Tobacco Use   Smoking status: Former    Current packs/day: 0.00    Types: Cigarettes    Start date: 07/01/1962    Quit date: 07/01/1964    Years since quitting: 58.7   Smokeless tobacco: Never  Vaping Use   Vaping status: Never Used  Substance and Sexual  Activity   Alcohol use: No   Drug use: No   Sexual activity: Not on file  Other Topics Concern   Not on file  Social History Narrative   Not on file   Social Determinants of Health   Financial Resource Strain: Not on file  Food Insecurity: Not on file  Transportation Needs: Not on file  Physical Activity: Not on file  Stress: Not on file  Social Connections: Not on file  Intimate Partner Violence: Not on file    Functional Status Survey:    No family history on file.  Health Maintenance  Topic Date Due   Zoster Vaccines- Shingrix (1 of 2) Never done   Pneumonia Vaccine 96+ Years old (1 of 1 - PCV) 01/20/1995   DEXA SCAN  Never done   Medicare Annual Wellness (AWV)  06/11/2020   COVID-19 Vaccine (1 - 2023-24 season) Never done   INFLUENZA VACCINE  03/18/2023   DTaP/Tdap/Td (3 - Td or Tdap) 07/14/2025   HPV VACCINES  Aged Out    Allergies  Allergen Reactions   Furosemide Other (See Comments)    Excessive Diarrhea.    Outpatient Encounter Medications as of 03/16/2023  Medication Sig   acetaminophen (TYLENOL) 325 MG tablet Take 325 mg by mouth every 6 (six) hours as needed for headache.   aspirin 81 MG tablet Take 81 mg by mouth at bedtime.   Calcium Citrate-Vitamin D (CALCIUM CITRATE +) 315-5 MG-MCG TABS Take 1 tablet by mouth daily.   Cholecalciferol 50 MCG (2000 UT) TABS Take 1 tablet by mouth daily.   clopidogrel (PLAVIX) 75 MG tablet Take 75 mg by mouth daily.   diclofenac sodium (VOLTAREN) 1 % GEL Apply topically as needed.   Multiple Vitamin (MULTIVITAMIN WITH MINERALS) TABS tablet Take 1 tablet by mouth daily.   olmesartan-hydrochlorothiazide (BENICAR HCT) 20-12.5 MG tablet Take 1 tablet by mouth daily.   omeprazole (PRILOSEC) 40 MG capsule Take 40 mg by mouth daily.   simvastatin (ZOCOR) 20 MG tablet Take 20 mg by mouth at bedtime.     No facility-administered encounter medications on file as of 03/16/2023.    Review of Systems  Respiratory: Negative.     Cardiovascular:  Positive for leg swelling.  Neurological:  Positive for speech difficulty.  All other systems reviewed and are negative.   There were no vitals filed for this visit. There is no height or weight on file to calculate BMI. Physical Exam Vitals and nursing note reviewed.  Constitutional:      Appearance: Normal appearance.  HENT:     Mouth/Throat:     Mouth: Mucous membranes are moist.     Pharynx: Oropharynx is clear.  Eyes:     Extraocular Movements: Extraocular movements intact.  Cardiovascular:     Rate and Rhythm: Normal rate and regular rhythm.  Pulmonary:     Effort: Pulmonary effort is normal.     Breath sounds: Normal breath sounds.  Abdominal:     General: Bowel sounds are normal.     Palpations: Abdomen is soft.  Musculoskeletal:     Right lower leg: Edema present.  Left lower leg: Edema present.  Neurological:     General: No focal deficit present.     Mental Status: She is alert.     Comments: Any communication with her is extremely difficult due to her inability to speak.  I tried to ask yes and no questions and even tried to get her to write out some answers but she seemed unable or unwilling to do that  Psychiatric:        Mood and Affect: Mood normal.        Behavior: Behavior normal.     Labs reviewed: Basic Metabolic Panel: No results for input(s): "NA", "K", "CL", "CO2", "GLUCOSE", "BUN", "CREATININE", "CALCIUM", "MG", "PHOS" in the last 8760 hours. Liver Function Tests: No results for input(s): "AST", "ALT", "ALKPHOS", "BILITOT", "PROT", "ALBUMIN" in the last 8760 hours. No results for input(s): "LIPASE", "AMYLASE" in the last 8760 hours. No results for input(s): "AMMONIA" in the last 8760 hours. CBC: No results for input(s): "WBC", "NEUTROABS", "HGB", "HCT", "MCV", "PLT" in the last 8760 hours. Cardiac Enzymes: No results for input(s): "CKTOTAL", "CKMB", "CKMBINDEX", "TROPONINI" in the last 8760 hours. BNP: Invalid input(s):  "POCBNP" No results found for: "HGBA1C" No results found for: "TSH" No results found for: "VITAMINB12" No results found for: "FOLATE" No results found for: "IRON", "TIBC", "FERRITIN"  Imaging and Procedures obtained prior to SNF admission: DG Shoulder Right  Result Date: 09/09/2022 CLINICAL DATA:  Fall today. Lost balance leading to fall on to ground. EXAM: RIGHT SHOULDER - 2+ VIEW COMPARISON:  None Available. FINDINGS: There is no evidence of fracture or dislocation. Mild acromioclavicular degenerative spurring. Subcortical cystic change the lateral humeral head. Soft tissue edema laterally. IMPRESSION: 1. Lateral soft tissue edema without acute fracture or dislocation. 2. Mild acromioclavicular degenerative spurring. Electronically Signed   By: Narda Rutherford M.D.   On: 09/09/2022 18:26   DG Elbow Complete Right  Result Date: 09/09/2022 CLINICAL DATA:  Fall today. Lost balance leading to fall on to ground. EXAM: RIGHT ELBOW - COMPLETE 3+ VIEW COMPARISON:  None Available. FINDINGS: No acute fracture or dislocation. Lateral view is slightly limited due to positioning, allowing for this no significant joint effusion. Mild degenerative spurring. There is soft tissue edema laterally. IMPRESSION: Soft tissue edema without acute fracture or dislocation. Electronically Signed   By: Narda Rutherford M.D.   On: 09/09/2022 18:24   DG Chest 1 View  Result Date: 09/09/2022 CLINICAL DATA:  Fall today. Lost balance leading to fall on to ground. EXAM: CHEST  1 VIEW COMPARISON:  Radiograph 12/24/2020 FINDINGS: Patient is rotated. Normal heart size with stable mediastinal contours. Aortic atherosclerosis. No pneumothorax, large pleural effusion or focal airspace disease. No evidence of acute fracture. IMPRESSION: No acute abnormality or evidence of traumatic injury. Electronically Signed   By: Narda Rutherford M.D.   On: 09/09/2022 18:23   DG Humerus Right  Result Date: 09/09/2022 CLINICAL DATA:  Fall today.  Lost balance leading to fall on to ground. EXAM: RIGHT HUMERUS - 2+ VIEW COMPARISON:  None Available. FINDINGS: There is no evidence of fracture or other focal bone lesions. Cortical margins of the humerus are intact. Soft tissue edema is seen proximal laterally. IMPRESSION: No acute fracture of the humerus. Electronically Signed   By: Narda Rutherford M.D.   On: 09/09/2022 18:19   CT Head Wo Contrast  Result Date: 09/09/2022 CLINICAL DATA:  Provided history: Polytrauma, blunt.  Fall. EXAM: CT HEAD WITHOUT CONTRAST CT CERVICAL SPINE WITHOUT CONTRAST TECHNIQUE: Multidetector CT  imaging of the head and cervical spine was performed following the standard protocol without intravenous contrast. Multiplanar CT image reconstructions of the cervical spine were also generated. RADIATION DOSE REDUCTION: This exam was performed according to the departmental dose-optimization program which includes automated exposure control, adjustment of the mA and/or kV according to patient size and/or use of iterative reconstruction technique. COMPARISON:  Brain MRI 09/17/2021. Head CT 07/15/2015. Maxillofacial CT 07/15/2015. FINDINGS: CT HEAD FINDINGS Brain: Generalized cerebral atrophy. Moderate patchy and ill-defined hypoattenuation within the cerebral white matter, nonspecific but compatible with chronic small vessel disease. As before, there are multiple small hypodensities within the bilateral deep gray nuclei and left internal capsule which likely reflect a combination of prominent perivascular spaces and chronic lacunar infarcts. There is no acute intracranial hemorrhage. No demarcated cortical infarct. No extra-axial fluid collection. No evidence of an intracranial mass. No midline shift. Vascular: No hyperdense vessel. Atherosclerotic calcifications. Skull: No fracture or aggressive osseous lesion. Sinuses/Orbits: No mass or acute finding within the imaged orbits. Prior metallic mesh repair of a right orbital floor fracture.  Mild mucosal thickening within the left maxillary sinus. Other: Right frontal scalp/forehead hematoma. CT CERVICAL SPINE FINDINGS Alignment: Straightening of the expected cervical lordosis. No significant spondylolisthesis. Skull base and vertebrae: The basion-dental and atlanto-dental intervals are maintained.No evidence of acute fracture to the cervical spine. Soft tissues and spinal canal: No prevertebral fluid or swelling. No visible canal hematoma. Disc levels: Cervical spondylosis with multilevel disc space narrowing, disc bulges/central disc protrusions, endplate spurring, uncovertebral hypertrophy and facet arthrosis. No appreciable high-grade spinal canal stenosis. Multilevel neural foraminal narrowing. Upper chest: No consolidation within the imaged lung apices. Biapical pleuroparenchymal scarring. IMPRESSION: CT head: 1. No evidence of acute intracranial abnormality. 2. Right anterior scalp/forehead hematoma. 3. Parenchymal atrophy and chronic small vessel disease, as described. 4. Mild mucosal thickening within the left maxillary sinus. CT cervical spine: 1. No evidence of acute fracture to the cervical spine. 2. Nonspecific straightening of the expected cervical lordosis. 3. Cervical spondylosis. Electronically Signed   By: Jackey Loge D.O.   On: 09/09/2022 16:49   CT Cervical Spine Wo Contrast  Result Date: 09/09/2022 CLINICAL DATA:  Provided history: Polytrauma, blunt.  Fall. EXAM: CT HEAD WITHOUT CONTRAST CT CERVICAL SPINE WITHOUT CONTRAST TECHNIQUE: Multidetector CT imaging of the head and cervical spine was performed following the standard protocol without intravenous contrast. Multiplanar CT image reconstructions of the cervical spine were also generated. RADIATION DOSE REDUCTION: This exam was performed according to the departmental dose-optimization program which includes automated exposure control, adjustment of the mA and/or kV according to patient size and/or use of iterative  reconstruction technique. COMPARISON:  Brain MRI 09/17/2021. Head CT 07/15/2015. Maxillofacial CT 07/15/2015. FINDINGS: CT HEAD FINDINGS Brain: Generalized cerebral atrophy. Moderate patchy and ill-defined hypoattenuation within the cerebral white matter, nonspecific but compatible with chronic small vessel disease. As before, there are multiple small hypodensities within the bilateral deep gray nuclei and left internal capsule which likely reflect a combination of prominent perivascular spaces and chronic lacunar infarcts. There is no acute intracranial hemorrhage. No demarcated cortical infarct. No extra-axial fluid collection. No evidence of an intracranial mass. No midline shift. Vascular: No hyperdense vessel. Atherosclerotic calcifications. Skull: No fracture or aggressive osseous lesion. Sinuses/Orbits: No mass or acute finding within the imaged orbits. Prior metallic mesh repair of a right orbital floor fracture. Mild mucosal thickening within the left maxillary sinus. Other: Right frontal scalp/forehead hematoma. CT CERVICAL SPINE FINDINGS Alignment: Straightening of the expected cervical  lordosis. No significant spondylolisthesis. Skull base and vertebrae: The basion-dental and atlanto-dental intervals are maintained.No evidence of acute fracture to the cervical spine. Soft tissues and spinal canal: No prevertebral fluid or swelling. No visible canal hematoma. Disc levels: Cervical spondylosis with multilevel disc space narrowing, disc bulges/central disc protrusions, endplate spurring, uncovertebral hypertrophy and facet arthrosis. No appreciable high-grade spinal canal stenosis. Multilevel neural foraminal narrowing. Upper chest: No consolidation within the imaged lung apices. Biapical pleuroparenchymal scarring. IMPRESSION: CT head: 1. No evidence of acute intracranial abnormality. 2. Right anterior scalp/forehead hematoma. 3. Parenchymal atrophy and chronic small vessel disease, as described. 4. Mild  mucosal thickening within the left maxillary sinus. CT cervical spine: 1. No evidence of acute fracture to the cervical spine. 2. Nonspecific straightening of the expected cervical lordosis. 3. Cervical spondylosis. Electronically Signed   By: Jackey Loge D.O.   On: 09/09/2022 16:49    Assessment/Plan  1. Edema, unspecified type History of venous insufficiency she has approximately 3+ edema per my exam today would probably benefit from diuretic therapy  2. Dysarthria and anarthria This is severe.  Speech is not intelligible but I do believe she understands what this examiner said to her  3. Primary hypertension Blood pressure today 116/60 on olmesartan  4. Moderate mixed hyperlipidemia not requiring statin therapy I do not have any record of lipids in her medical record.  Since she is taking statin I would like to see where her lipids are currently with dose of statin she takes   Family/ staff Communication:   Labs/tests ordered:  Bertram Millard. Hyacinth Meeker, MD [facility] [address] [place], [place] [address] Office [phone]

## 2023-03-18 DIAGNOSIS — R2681 Unsteadiness on feet: Secondary | ICD-10-CM | POA: Diagnosis not present

## 2023-03-18 DIAGNOSIS — R41841 Cognitive communication deficit: Secondary | ICD-10-CM | POA: Diagnosis not present

## 2023-03-18 DIAGNOSIS — R471 Dysarthria and anarthria: Secondary | ICD-10-CM | POA: Diagnosis not present

## 2023-03-18 DIAGNOSIS — R1312 Dysphagia, oropharyngeal phase: Secondary | ICD-10-CM | POA: Diagnosis not present

## 2023-03-18 DIAGNOSIS — M6281 Muscle weakness (generalized): Secondary | ICD-10-CM | POA: Diagnosis not present

## 2023-03-18 DIAGNOSIS — R29898 Other symptoms and signs involving the musculoskeletal system: Secondary | ICD-10-CM | POA: Diagnosis not present

## 2023-03-19 NOTE — Addendum Note (Signed)
Addended by: Frederica Kuster on: 03/19/2023 11:29 AM   Modules accepted: Level of Service

## 2023-03-23 DIAGNOSIS — R41841 Cognitive communication deficit: Secondary | ICD-10-CM | POA: Diagnosis not present

## 2023-03-23 DIAGNOSIS — R471 Dysarthria and anarthria: Secondary | ICD-10-CM | POA: Diagnosis not present

## 2023-03-23 DIAGNOSIS — R29898 Other symptoms and signs involving the musculoskeletal system: Secondary | ICD-10-CM | POA: Diagnosis not present

## 2023-03-23 DIAGNOSIS — R1312 Dysphagia, oropharyngeal phase: Secondary | ICD-10-CM | POA: Diagnosis not present

## 2023-03-23 DIAGNOSIS — M6281 Muscle weakness (generalized): Secondary | ICD-10-CM | POA: Diagnosis not present

## 2023-03-23 DIAGNOSIS — R2681 Unsteadiness on feet: Secondary | ICD-10-CM | POA: Diagnosis not present

## 2023-03-24 DIAGNOSIS — M6281 Muscle weakness (generalized): Secondary | ICD-10-CM | POA: Diagnosis not present

## 2023-03-24 DIAGNOSIS — R41841 Cognitive communication deficit: Secondary | ICD-10-CM | POA: Diagnosis not present

## 2023-03-24 DIAGNOSIS — R2681 Unsteadiness on feet: Secondary | ICD-10-CM | POA: Diagnosis not present

## 2023-03-24 DIAGNOSIS — R1312 Dysphagia, oropharyngeal phase: Secondary | ICD-10-CM | POA: Diagnosis not present

## 2023-03-24 DIAGNOSIS — R471 Dysarthria and anarthria: Secondary | ICD-10-CM | POA: Diagnosis not present

## 2023-03-24 DIAGNOSIS — R29898 Other symptoms and signs involving the musculoskeletal system: Secondary | ICD-10-CM | POA: Diagnosis not present

## 2023-03-25 DIAGNOSIS — R1312 Dysphagia, oropharyngeal phase: Secondary | ICD-10-CM | POA: Diagnosis not present

## 2023-03-25 DIAGNOSIS — R2681 Unsteadiness on feet: Secondary | ICD-10-CM | POA: Diagnosis not present

## 2023-03-25 DIAGNOSIS — M6281 Muscle weakness (generalized): Secondary | ICD-10-CM | POA: Diagnosis not present

## 2023-03-25 DIAGNOSIS — R471 Dysarthria and anarthria: Secondary | ICD-10-CM | POA: Diagnosis not present

## 2023-03-25 DIAGNOSIS — R41841 Cognitive communication deficit: Secondary | ICD-10-CM | POA: Diagnosis not present

## 2023-03-25 DIAGNOSIS — R29898 Other symptoms and signs involving the musculoskeletal system: Secondary | ICD-10-CM | POA: Diagnosis not present

## 2023-03-26 ENCOUNTER — Encounter: Payer: Self-pay | Admitting: Family Medicine

## 2023-03-26 ENCOUNTER — Encounter: Payer: Self-pay | Admitting: Nurse Practitioner

## 2023-03-26 DIAGNOSIS — W19XXXA Unspecified fall, initial encounter: Secondary | ICD-10-CM | POA: Insufficient documentation

## 2023-03-29 ENCOUNTER — Encounter: Payer: Self-pay | Admitting: Nurse Practitioner

## 2023-03-29 ENCOUNTER — Encounter: Payer: Self-pay | Admitting: Sports Medicine

## 2023-03-29 ENCOUNTER — Encounter: Payer: Self-pay | Admitting: Family Medicine

## 2023-03-29 ENCOUNTER — Non-Acute Institutional Stay: Payer: Medicare Other | Admitting: Nurse Practitioner

## 2023-03-29 DIAGNOSIS — E782 Mixed hyperlipidemia: Secondary | ICD-10-CM

## 2023-03-29 DIAGNOSIS — R269 Unspecified abnormalities of gait and mobility: Secondary | ICD-10-CM | POA: Diagnosis not present

## 2023-03-29 DIAGNOSIS — W19XXXA Unspecified fall, initial encounter: Secondary | ICD-10-CM

## 2023-03-29 DIAGNOSIS — R609 Edema, unspecified: Secondary | ICD-10-CM | POA: Diagnosis not present

## 2023-03-29 DIAGNOSIS — G3184 Mild cognitive impairment, so stated: Secondary | ICD-10-CM

## 2023-03-29 DIAGNOSIS — R471 Dysarthria and anarthria: Secondary | ICD-10-CM | POA: Diagnosis not present

## 2023-03-29 DIAGNOSIS — R1312 Dysphagia, oropharyngeal phase: Secondary | ICD-10-CM | POA: Diagnosis not present

## 2023-03-29 DIAGNOSIS — R41841 Cognitive communication deficit: Secondary | ICD-10-CM | POA: Diagnosis not present

## 2023-03-29 DIAGNOSIS — I10 Essential (primary) hypertension: Secondary | ICD-10-CM | POA: Diagnosis not present

## 2023-03-29 DIAGNOSIS — K219 Gastro-esophageal reflux disease without esophagitis: Secondary | ICD-10-CM | POA: Diagnosis not present

## 2023-03-29 DIAGNOSIS — R29898 Other symptoms and signs involving the musculoskeletal system: Secondary | ICD-10-CM | POA: Diagnosis not present

## 2023-03-29 DIAGNOSIS — E559 Vitamin D deficiency, unspecified: Secondary | ICD-10-CM | POA: Diagnosis not present

## 2023-03-29 DIAGNOSIS — R2681 Unsteadiness on feet: Secondary | ICD-10-CM | POA: Diagnosis not present

## 2023-03-29 DIAGNOSIS — M6281 Muscle weakness (generalized): Secondary | ICD-10-CM | POA: Diagnosis not present

## 2023-03-29 DIAGNOSIS — F0390 Unspecified dementia without behavioral disturbance: Secondary | ICD-10-CM | POA: Insufficient documentation

## 2023-03-29 NOTE — Progress Notes (Unsigned)
This encounter was created in error - please disregard.

## 2023-03-29 NOTE — Assessment & Plan Note (Addendum)
03/25/23 fall, a bruise noted left fore arm near elbow, no decreased ROM of the left elbow. Low BP is contributory? Therapy to eval and tx.

## 2023-03-29 NOTE — Assessment & Plan Note (Signed)
Trace edema BLE, mostly in ankles.

## 2023-03-29 NOTE — Assessment & Plan Note (Signed)
Table, continue Omeprazole, update CBC/diff.

## 2023-03-29 NOTE — Assessment & Plan Note (Signed)
Uses power w/c for mobility.

## 2023-03-29 NOTE — Assessment & Plan Note (Signed)
Continue Statin, update lipid panel.

## 2023-03-29 NOTE — Assessment & Plan Note (Signed)
Blood pressure is running low, will decrease Olmesartan/hydrochlorothiazide 25/12.5mg  to 1/2 tab po daily, VS q shift x 72hrs then daily, update CMP/eGFR

## 2023-03-29 NOTE — Progress Notes (Unsigned)
Location:   [facility] Room Number: 918 Place of Service:  ALF (13) Provider: Arna Snipe Nekayla Heider NP  Chilton Greathouse, MD  Patient Care Team: Chilton Greathouse, MD as PCP - General (Internal Medicine)  Extended Emergency Contact Information Primary Emergency Contact: Jaquelyn Bitter of [place] Home Phone: [phone] Mobile Phone: [phone] Relation: Nephew Secondary Emergency Contact: Ferne Coe [place] of [place] Home Phone: [phone] Relation: Other  Code Status: DNR Goals of care: Advanced Directive information    03/29/2023   11:10 AM  Advanced Directives  Does Patient Have a Medical Advance Directive? No  Would patient like information on creating a medical advance directive? No - Patient declined     Chief Complaint  Patient presents with  . Acute Visit    fall    HPI:  Pt is a 89 y.o. female seen today for an acute visit for fall, no injury noted  Edema, minimal, taking hydrochlorothiazide 12.5 mg every day  HLD on statin  HTN, low Bp  Hx of CVA/Dysarthria, severe, on Plavix  GERD, taking Omeprazole.   Cognitive impairment, MMSE 24/30 03/29/23     Past Medical History:  Diagnosis Date  . Arthritis   . Edema    bilateral ankles  . History of CVA (cerebrovascular accident) 02/11/2023   02/11/23 MOSE DNR, limited interventions, ABT/IVF if indicated, no feeding tube.     Marland Kitchen History of urethral stricture    freq/ urge  . HOH (hard of hearing)    both ears  . Hypertension    elevates legs   . Urethral stricture   . Urinary retention   . Wears hearing aid    Past Surgical History:  Procedure Laterality Date  . CYSTO/ URETHRAL DILATION/ FULGERATION OF BLEEDER  10-16-10  . CYSTOSCOPY WITH URETHRAL DILATATION  07/02/2011   Procedure: CYSTOSCOPY WITH URETHRAL DILATATION;  Surgeon: Anner Crete;  Location: [facility];  Service: Urology;  Laterality: N/A;  . CYSTOSCOPY WITH URETHRAL DILATATION N/A 08/24/2013    Procedure: CYSTOSCOPY WITH URETHRAL DILATATION;  Surgeon: Bjorn Pippin, MD;  Location[facility];  Service: Urology;  Laterality: N/A;  . CYSTOSCOPY WITH URETHRAL DILATATION N/A 05/02/2015   Procedure: CYSTOSCOPY WITH URETHRAL DILATATION, FULGURATION BLADDER NECK;  Surgeon: Bjorn Pippin, MD;  Location: [facility];  Service: Urology;  Laterality: N/A;  . CYSTOSCOPY WITH URETHRAL DILATATION N/A 08/03/2017   Procedure: CYSTOSCOPY WITH URETHRAL DILATATION;  Surgeon: Bjorn Pippin, MD;  Location: [facility];  Service: Urology;  Laterality: N/A;  . ORIF ORBITAL FRACTURE Right 07/29/2015   Procedure: RIGHT ORBITAL FLOOR REPAIR ;  Surgeon: Christia Reading, MD;  Location: [facility];  Service: ENT;  Laterality: Right;    Allergies  Allergen Reactions  . Furosemide Other (See Comments)    Excessive Diarrhea.    Allergies as of 03/29/2023       Reactions   Furosemide Other (See Comments)   Excessive Diarrhea.        Medication List        Accurate as of March 29, 2023 11:59 PM. If you have any questions, ask your nurse or doctor.          acetaminophen 325 MG tablet Commonly known as: TYLENOL Take 325 mg by mouth every 6 (six) hours as needed for headache.   aspirin 81 MG tablet Take 81 mg by mouth at bedtime.   Calcium Citrate + 315-5 MG-MCG Tabs Generic drug: Calcium Citrate-Vitamin  D Take 1 tablet by mouth daily.   Cholecalciferol 50 MCG (2000 UT) Tabs Take 1 tablet by mouth daily.   clopidogrel 75 MG tablet Commonly known as: PLAVIX Take 75 mg by mouth daily.   diclofenac sodium 1 % Gel Commonly known as: VOLTAREN Apply topically as needed.   multivitamin with minerals Tabs tablet Take 1 tablet by mouth daily.   olmesartan-hydrochlorothiazide 20-12.5 MG tablet Commonly known as: BENICAR HCT Take 1 tablet by mouth daily.   omeprazole 40 MG capsule Commonly known as: PRILOSEC Take 40 mg by mouth daily.    simvastatin 20 MG tablet Commonly known as: ZOCOR Take 20 mg by mouth at bedtime.        Review of Systems  Constitutional:  Negative for appetite change, fatigue and fever.  HENT:  Negative for congestion.   Respiratory:  Negative for cough, shortness of breath and wheezing.   Cardiovascular:  Positive for leg swelling. Negative for chest pain and palpitations.  Gastrointestinal:  Negative for abdominal pain.  Genitourinary:  Negative for dysuria and urgency.  Musculoskeletal:  Positive for arthralgias and gait problem.  Skin:        Upper left forearm bruise.   Neurological:  Positive for speech difficulty. Negative for syncope and headaches.  Psychiatric/Behavioral:  Negative for behavioral problems. The patient is not nervous/anxious.     Immunization History  Administered Date(s) Administered  . DTaP 02/20/2008  . Influenza-Unspecified 05/17/2010  . Pneumococcal-Unspecified 12/15/2005  . Tdap 07/15/2015   Pertinent  Health Maintenance Due  Topic Date Due  . DEXA SCAN  Never done  . INFLUENZA VACCINE  03/18/2023      12/24/2020   10:29 AM  Fall Risk  (RETIRED) Patient Fall Risk Level High fall risk   Functional Status Survey:    Vitals:   03/29/23 1507 03/30/23 1127  BP: 90/86 (!) 107/55  Pulse: 62   Resp: 18   Temp: (!) 97.5 F (36.4 C)   SpO2: 98%   Weight: 126 lb 6.4 oz (57.3 kg)    Body mass index is 23.12 kg/m. Physical Exam Vitals reviewed.  Constitutional:      Appearance: Normal appearance.  HENT:     Head: Normocephalic and atraumatic.     Nose: Nose normal.     Mouth/Throat:     Mouth: Mucous membranes are moist.  Eyes:     Extraocular Movements: Extraocular movements intact.     Conjunctiva/sclera: Conjunctivae normal.     Pupils: Pupils are equal, round, and reactive to light.  Cardiovascular:     Rate and Rhythm: Normal rate and regular rhythm.     Heart sounds: No murmur heard. Pulmonary:     Effort: Pulmonary effort is  normal.     Breath sounds: No rales.  Abdominal:     General: Bowel sounds are normal.     Palpations: Abdomen is soft.     Tenderness: There is no abdominal tenderness.  Musculoskeletal:     Cervical back: Normal range of motion and neck supple.     Right lower leg: Edema present.     Left lower leg: Edema present.     Comments: Trace edema BLE  Skin:    General: Skin is warm and dry.     Findings: Bruising present.     Comments: A bruise upper left forearm, no pain or swelling, no decreased ROM of the left elbow.   Neurological:     General: No focal deficit present.  Mental Status: She is alert. Mental status is at baseline.     Gait: Gait abnormal.     Comments: Oriented to person, uses power w/c to get around.   Psychiatric:        Mood and Affect: Mood normal.        Behavior: Behavior normal.    Labs reviewed: No results for input(s): "NA", "K", "CL", "CO2", "GLUCOSE", "BUN", "CREATININE", "CALCIUM", "MG", "PHOS" in the last 8760 hours. No results for input(s): "AST", "ALT", "ALKPHOS", "BILITOT", "PROT", "ALBUMIN" in the last 8760 hours. No results for input(s): "WBC", "NEUTROABS", "HGB", "HCT", "MCV", "PLT" in the last 8760 hours. No results found for: "TSH" No results found for: "HGBA1C" No results found for: "CHOL", "HDL", "LDLCALC", "LDLDIRECT", "TRIG", "CHOLHDL"  Significant Diagnostic Results in last 30 days:  No results found.  Assessment/Plan: Fall 03/25/23 fall, a bruise noted left fore arm near elbow, no decreased ROM of the left elbow. Low BP is contributory? Therapy to eval and tx.   Hypertension Blood pressure is running low, will decrease Olmesartan/hydrochlorothiazide 25/12.5mg  to 1/2 tab po daily, VS q shift x 72hrs then daily, update CMP/eGFR  Hyperlipidemia Continue Statin, update lipid panel.   Edema Trace edema BLE, mostly in ankles.   GERD (gastroesophageal reflux disease) Table, continue Omeprazole, update CBC/diff.   Vitamin D  deficiency Continue Vit D, check Vit D level.   MCI (mild cognitive impairment) Will obtain MMSE, check TSH 03/29/23 MMSE 24/30  Gait abnormality Uses power w/c for mobility.     Family/ staff Communication: plan of care reviewed with the patient and charge nurse.   Labs/tests ordered:  CBC/diff, CMP/eGFR, Vit D, TSH, lipids   Time  spend 40 minutes.

## 2023-03-29 NOTE — Assessment & Plan Note (Signed)
Will obtain MMSE, check TSH 03/29/23 MMSE 24/30

## 2023-03-29 NOTE — Assessment & Plan Note (Signed)
Continue Vit D, check Vit D level.

## 2023-03-30 ENCOUNTER — Encounter: Payer: Self-pay | Admitting: Sports Medicine

## 2023-03-30 DIAGNOSIS — R41841 Cognitive communication deficit: Secondary | ICD-10-CM | POA: Diagnosis not present

## 2023-03-30 DIAGNOSIS — R29898 Other symptoms and signs involving the musculoskeletal system: Secondary | ICD-10-CM | POA: Diagnosis not present

## 2023-03-30 DIAGNOSIS — R2681 Unsteadiness on feet: Secondary | ICD-10-CM | POA: Diagnosis not present

## 2023-03-30 DIAGNOSIS — I10 Essential (primary) hypertension: Secondary | ICD-10-CM | POA: Diagnosis not present

## 2023-03-30 DIAGNOSIS — M6281 Muscle weakness (generalized): Secondary | ICD-10-CM | POA: Diagnosis not present

## 2023-03-30 DIAGNOSIS — E559 Vitamin D deficiency, unspecified: Secondary | ICD-10-CM | POA: Diagnosis not present

## 2023-03-30 DIAGNOSIS — R471 Dysarthria and anarthria: Secondary | ICD-10-CM | POA: Diagnosis not present

## 2023-03-30 DIAGNOSIS — R1312 Dysphagia, oropharyngeal phase: Secondary | ICD-10-CM | POA: Diagnosis not present

## 2023-03-30 LAB — CBC AND DIFFERENTIAL
HCT: 34 — AB (ref 36–46)
Hemoglobin: 11.2 — AB (ref 12.0–16.0)
Neutrophils Absolute: 4644
Platelets: 322 10*3/uL (ref 150–400)
WBC: 6.2

## 2023-03-30 LAB — HEPATIC FUNCTION PANEL
ALT: 13 U/L (ref 7–35)
AST: 14 (ref 13–35)
Alkaline Phosphatase: 58 (ref 25–125)
Bilirubin, Total: 1

## 2023-03-30 LAB — BASIC METABOLIC PANEL
BUN: 37 — AB (ref 4–21)
CO2: 28 — AB (ref 13–22)
Chloride: 97 — AB (ref 99–108)
Creatinine: 1.5 — AB (ref 0.5–1.1)
Glucose: 143
Potassium: 4.1 meq/L (ref 3.5–5.1)
Sodium: 134 — AB (ref 137–147)

## 2023-03-30 LAB — CBC: RBC: 3.72 — AB (ref 3.87–5.11)

## 2023-03-30 LAB — VITAMIN D 25 HYDROXY (VIT D DEFICIENCY, FRACTURES): Vit D, 25-Hydroxy: 56

## 2023-03-30 LAB — LIPID PANEL
Cholesterol: 141 (ref 0–200)
HDL: 49 (ref 35–70)
LDL Cholesterol: 75
LDl/HDL Ratio: 2.9
Triglycerides: 85 (ref 40–160)

## 2023-03-30 LAB — COMPREHENSIVE METABOLIC PANEL
Albumin: 4 (ref 3.5–5.0)
Calcium: 9.5 (ref 8.7–10.7)
Globulin: 2.6
eGFR: 33

## 2023-03-31 DIAGNOSIS — M6281 Muscle weakness (generalized): Secondary | ICD-10-CM | POA: Diagnosis not present

## 2023-03-31 DIAGNOSIS — R471 Dysarthria and anarthria: Secondary | ICD-10-CM | POA: Diagnosis not present

## 2023-03-31 DIAGNOSIS — R41841 Cognitive communication deficit: Secondary | ICD-10-CM | POA: Diagnosis not present

## 2023-03-31 DIAGNOSIS — R29898 Other symptoms and signs involving the musculoskeletal system: Secondary | ICD-10-CM | POA: Diagnosis not present

## 2023-03-31 DIAGNOSIS — R2681 Unsteadiness on feet: Secondary | ICD-10-CM | POA: Diagnosis not present

## 2023-03-31 DIAGNOSIS — R1312 Dysphagia, oropharyngeal phase: Secondary | ICD-10-CM | POA: Diagnosis not present

## 2023-04-01 DIAGNOSIS — R1312 Dysphagia, oropharyngeal phase: Secondary | ICD-10-CM | POA: Diagnosis not present

## 2023-04-01 DIAGNOSIS — R41841 Cognitive communication deficit: Secondary | ICD-10-CM | POA: Diagnosis not present

## 2023-04-01 DIAGNOSIS — R2681 Unsteadiness on feet: Secondary | ICD-10-CM | POA: Diagnosis not present

## 2023-04-01 DIAGNOSIS — M6281 Muscle weakness (generalized): Secondary | ICD-10-CM | POA: Diagnosis not present

## 2023-04-01 DIAGNOSIS — R471 Dysarthria and anarthria: Secondary | ICD-10-CM | POA: Diagnosis not present

## 2023-04-01 DIAGNOSIS — R29898 Other symptoms and signs involving the musculoskeletal system: Secondary | ICD-10-CM | POA: Diagnosis not present

## 2023-04-02 DIAGNOSIS — M6281 Muscle weakness (generalized): Secondary | ICD-10-CM | POA: Diagnosis not present

## 2023-04-02 DIAGNOSIS — R41841 Cognitive communication deficit: Secondary | ICD-10-CM | POA: Diagnosis not present

## 2023-04-02 DIAGNOSIS — R471 Dysarthria and anarthria: Secondary | ICD-10-CM | POA: Diagnosis not present

## 2023-04-02 DIAGNOSIS — R2681 Unsteadiness on feet: Secondary | ICD-10-CM | POA: Diagnosis not present

## 2023-04-02 DIAGNOSIS — R1312 Dysphagia, oropharyngeal phase: Secondary | ICD-10-CM | POA: Diagnosis not present

## 2023-04-02 DIAGNOSIS — R29898 Other symptoms and signs involving the musculoskeletal system: Secondary | ICD-10-CM | POA: Diagnosis not present

## 2023-04-06 DIAGNOSIS — R471 Dysarthria and anarthria: Secondary | ICD-10-CM | POA: Diagnosis not present

## 2023-04-06 DIAGNOSIS — R41841 Cognitive communication deficit: Secondary | ICD-10-CM | POA: Diagnosis not present

## 2023-04-06 DIAGNOSIS — R2681 Unsteadiness on feet: Secondary | ICD-10-CM | POA: Diagnosis not present

## 2023-04-06 DIAGNOSIS — R1312 Dysphagia, oropharyngeal phase: Secondary | ICD-10-CM | POA: Diagnosis not present

## 2023-04-06 DIAGNOSIS — M6281 Muscle weakness (generalized): Secondary | ICD-10-CM | POA: Diagnosis not present

## 2023-04-06 DIAGNOSIS — R29898 Other symptoms and signs involving the musculoskeletal system: Secondary | ICD-10-CM | POA: Diagnosis not present

## 2023-04-07 DIAGNOSIS — R1312 Dysphagia, oropharyngeal phase: Secondary | ICD-10-CM | POA: Diagnosis not present

## 2023-04-07 DIAGNOSIS — R2681 Unsteadiness on feet: Secondary | ICD-10-CM | POA: Diagnosis not present

## 2023-04-07 DIAGNOSIS — R29898 Other symptoms and signs involving the musculoskeletal system: Secondary | ICD-10-CM | POA: Diagnosis not present

## 2023-04-07 DIAGNOSIS — R471 Dysarthria and anarthria: Secondary | ICD-10-CM | POA: Diagnosis not present

## 2023-04-07 DIAGNOSIS — M6281 Muscle weakness (generalized): Secondary | ICD-10-CM | POA: Diagnosis not present

## 2023-04-07 DIAGNOSIS — R41841 Cognitive communication deficit: Secondary | ICD-10-CM | POA: Diagnosis not present

## 2023-04-08 DIAGNOSIS — R1312 Dysphagia, oropharyngeal phase: Secondary | ICD-10-CM | POA: Diagnosis not present

## 2023-04-08 DIAGNOSIS — R29898 Other symptoms and signs involving the musculoskeletal system: Secondary | ICD-10-CM | POA: Diagnosis not present

## 2023-04-08 DIAGNOSIS — R41841 Cognitive communication deficit: Secondary | ICD-10-CM | POA: Diagnosis not present

## 2023-04-08 DIAGNOSIS — M6281 Muscle weakness (generalized): Secondary | ICD-10-CM | POA: Diagnosis not present

## 2023-04-08 DIAGNOSIS — R471 Dysarthria and anarthria: Secondary | ICD-10-CM | POA: Diagnosis not present

## 2023-04-08 DIAGNOSIS — R2681 Unsteadiness on feet: Secondary | ICD-10-CM | POA: Diagnosis not present

## 2023-04-09 DIAGNOSIS — R29898 Other symptoms and signs involving the musculoskeletal system: Secondary | ICD-10-CM | POA: Diagnosis not present

## 2023-04-09 DIAGNOSIS — R41841 Cognitive communication deficit: Secondary | ICD-10-CM | POA: Diagnosis not present

## 2023-04-09 DIAGNOSIS — R471 Dysarthria and anarthria: Secondary | ICD-10-CM | POA: Diagnosis not present

## 2023-04-09 DIAGNOSIS — R2681 Unsteadiness on feet: Secondary | ICD-10-CM | POA: Diagnosis not present

## 2023-04-09 DIAGNOSIS — M6281 Muscle weakness (generalized): Secondary | ICD-10-CM | POA: Diagnosis not present

## 2023-04-09 DIAGNOSIS — R1312 Dysphagia, oropharyngeal phase: Secondary | ICD-10-CM | POA: Diagnosis not present

## 2023-04-13 DIAGNOSIS — R471 Dysarthria and anarthria: Secondary | ICD-10-CM | POA: Diagnosis not present

## 2023-04-13 DIAGNOSIS — R29898 Other symptoms and signs involving the musculoskeletal system: Secondary | ICD-10-CM | POA: Diagnosis not present

## 2023-04-13 DIAGNOSIS — R41841 Cognitive communication deficit: Secondary | ICD-10-CM | POA: Diagnosis not present

## 2023-04-13 DIAGNOSIS — M6281 Muscle weakness (generalized): Secondary | ICD-10-CM | POA: Diagnosis not present

## 2023-04-13 DIAGNOSIS — R1312 Dysphagia, oropharyngeal phase: Secondary | ICD-10-CM | POA: Diagnosis not present

## 2023-04-13 DIAGNOSIS — R2681 Unsteadiness on feet: Secondary | ICD-10-CM | POA: Diagnosis not present

## 2023-04-14 DIAGNOSIS — R41841 Cognitive communication deficit: Secondary | ICD-10-CM | POA: Diagnosis not present

## 2023-04-14 DIAGNOSIS — R1312 Dysphagia, oropharyngeal phase: Secondary | ICD-10-CM | POA: Diagnosis not present

## 2023-04-14 DIAGNOSIS — R471 Dysarthria and anarthria: Secondary | ICD-10-CM | POA: Diagnosis not present

## 2023-04-14 DIAGNOSIS — R29898 Other symptoms and signs involving the musculoskeletal system: Secondary | ICD-10-CM | POA: Diagnosis not present

## 2023-04-14 DIAGNOSIS — M6281 Muscle weakness (generalized): Secondary | ICD-10-CM | POA: Diagnosis not present

## 2023-04-14 DIAGNOSIS — R2681 Unsteadiness on feet: Secondary | ICD-10-CM | POA: Diagnosis not present

## 2023-04-15 DIAGNOSIS — R1312 Dysphagia, oropharyngeal phase: Secondary | ICD-10-CM | POA: Diagnosis not present

## 2023-04-15 DIAGNOSIS — M6281 Muscle weakness (generalized): Secondary | ICD-10-CM | POA: Diagnosis not present

## 2023-04-15 DIAGNOSIS — R2681 Unsteadiness on feet: Secondary | ICD-10-CM | POA: Diagnosis not present

## 2023-04-15 DIAGNOSIS — R29898 Other symptoms and signs involving the musculoskeletal system: Secondary | ICD-10-CM | POA: Diagnosis not present

## 2023-04-15 DIAGNOSIS — R471 Dysarthria and anarthria: Secondary | ICD-10-CM | POA: Diagnosis not present

## 2023-04-15 DIAGNOSIS — R41841 Cognitive communication deficit: Secondary | ICD-10-CM | POA: Diagnosis not present

## 2023-04-20 DIAGNOSIS — R471 Dysarthria and anarthria: Secondary | ICD-10-CM | POA: Diagnosis not present

## 2023-04-20 DIAGNOSIS — R41841 Cognitive communication deficit: Secondary | ICD-10-CM | POA: Diagnosis not present

## 2023-04-20 DIAGNOSIS — R1312 Dysphagia, oropharyngeal phase: Secondary | ICD-10-CM | POA: Diagnosis not present

## 2023-04-20 DIAGNOSIS — M6281 Muscle weakness (generalized): Secondary | ICD-10-CM | POA: Diagnosis not present

## 2023-04-20 DIAGNOSIS — R29898 Other symptoms and signs involving the musculoskeletal system: Secondary | ICD-10-CM | POA: Diagnosis not present

## 2023-04-20 DIAGNOSIS — R2681 Unsteadiness on feet: Secondary | ICD-10-CM | POA: Diagnosis not present

## 2023-04-22 DIAGNOSIS — R29898 Other symptoms and signs involving the musculoskeletal system: Secondary | ICD-10-CM | POA: Diagnosis not present

## 2023-04-22 DIAGNOSIS — R2681 Unsteadiness on feet: Secondary | ICD-10-CM | POA: Diagnosis not present

## 2023-04-22 DIAGNOSIS — M6281 Muscle weakness (generalized): Secondary | ICD-10-CM | POA: Diagnosis not present

## 2023-04-22 DIAGNOSIS — R41841 Cognitive communication deficit: Secondary | ICD-10-CM | POA: Diagnosis not present

## 2023-04-22 DIAGNOSIS — R471 Dysarthria and anarthria: Secondary | ICD-10-CM | POA: Diagnosis not present

## 2023-04-22 DIAGNOSIS — R1312 Dysphagia, oropharyngeal phase: Secondary | ICD-10-CM | POA: Diagnosis not present

## 2023-04-24 DIAGNOSIS — R41841 Cognitive communication deficit: Secondary | ICD-10-CM | POA: Diagnosis not present

## 2023-04-24 DIAGNOSIS — R471 Dysarthria and anarthria: Secondary | ICD-10-CM | POA: Diagnosis not present

## 2023-04-24 DIAGNOSIS — R29898 Other symptoms and signs involving the musculoskeletal system: Secondary | ICD-10-CM | POA: Diagnosis not present

## 2023-04-24 DIAGNOSIS — M6281 Muscle weakness (generalized): Secondary | ICD-10-CM | POA: Diagnosis not present

## 2023-04-24 DIAGNOSIS — R2681 Unsteadiness on feet: Secondary | ICD-10-CM | POA: Diagnosis not present

## 2023-04-24 DIAGNOSIS — R1312 Dysphagia, oropharyngeal phase: Secondary | ICD-10-CM | POA: Diagnosis not present

## 2023-04-26 DIAGNOSIS — R1312 Dysphagia, oropharyngeal phase: Secondary | ICD-10-CM | POA: Diagnosis not present

## 2023-04-26 DIAGNOSIS — M6281 Muscle weakness (generalized): Secondary | ICD-10-CM | POA: Diagnosis not present

## 2023-04-26 DIAGNOSIS — R41841 Cognitive communication deficit: Secondary | ICD-10-CM | POA: Diagnosis not present

## 2023-04-26 DIAGNOSIS — R2681 Unsteadiness on feet: Secondary | ICD-10-CM | POA: Diagnosis not present

## 2023-04-26 DIAGNOSIS — R29898 Other symptoms and signs involving the musculoskeletal system: Secondary | ICD-10-CM | POA: Diagnosis not present

## 2023-04-26 DIAGNOSIS — R471 Dysarthria and anarthria: Secondary | ICD-10-CM | POA: Diagnosis not present

## 2023-04-27 ENCOUNTER — Encounter: Payer: Self-pay | Admitting: Nurse Practitioner

## 2023-04-27 DIAGNOSIS — R29898 Other symptoms and signs involving the musculoskeletal system: Secondary | ICD-10-CM | POA: Diagnosis not present

## 2023-04-27 DIAGNOSIS — R2681 Unsteadiness on feet: Secondary | ICD-10-CM | POA: Diagnosis not present

## 2023-04-27 DIAGNOSIS — R634 Abnormal weight loss: Secondary | ICD-10-CM | POA: Insufficient documentation

## 2023-04-27 DIAGNOSIS — R1312 Dysphagia, oropharyngeal phase: Secondary | ICD-10-CM | POA: Diagnosis not present

## 2023-04-27 DIAGNOSIS — R627 Adult failure to thrive: Secondary | ICD-10-CM | POA: Insufficient documentation

## 2023-04-27 DIAGNOSIS — R471 Dysarthria and anarthria: Secondary | ICD-10-CM | POA: Diagnosis not present

## 2023-04-27 DIAGNOSIS — R41841 Cognitive communication deficit: Secondary | ICD-10-CM | POA: Diagnosis not present

## 2023-04-27 DIAGNOSIS — M6281 Muscle weakness (generalized): Secondary | ICD-10-CM | POA: Diagnosis not present

## 2023-04-29 ENCOUNTER — Non-Acute Institutional Stay: Payer: Medicare Other | Admitting: Nurse Practitioner

## 2023-04-29 ENCOUNTER — Encounter: Payer: Self-pay | Admitting: Nurse Practitioner

## 2023-04-29 DIAGNOSIS — K219 Gastro-esophageal reflux disease without esophagitis: Secondary | ICD-10-CM | POA: Diagnosis not present

## 2023-04-29 DIAGNOSIS — R41841 Cognitive communication deficit: Secondary | ICD-10-CM | POA: Diagnosis not present

## 2023-04-29 DIAGNOSIS — R609 Edema, unspecified: Secondary | ICD-10-CM

## 2023-04-29 DIAGNOSIS — Z8673 Personal history of transient ischemic attack (TIA), and cerebral infarction without residual deficits: Secondary | ICD-10-CM | POA: Diagnosis not present

## 2023-04-29 DIAGNOSIS — I10 Essential (primary) hypertension: Secondary | ICD-10-CM | POA: Diagnosis not present

## 2023-04-29 DIAGNOSIS — R1310 Dysphagia, unspecified: Secondary | ICD-10-CM | POA: Diagnosis not present

## 2023-04-29 DIAGNOSIS — G3184 Mild cognitive impairment, so stated: Secondary | ICD-10-CM | POA: Diagnosis not present

## 2023-04-29 DIAGNOSIS — R634 Abnormal weight loss: Secondary | ICD-10-CM | POA: Diagnosis not present

## 2023-04-29 DIAGNOSIS — R2681 Unsteadiness on feet: Secondary | ICD-10-CM | POA: Diagnosis not present

## 2023-04-29 DIAGNOSIS — R29898 Other symptoms and signs involving the musculoskeletal system: Secondary | ICD-10-CM | POA: Diagnosis not present

## 2023-04-29 DIAGNOSIS — R1312 Dysphagia, oropharyngeal phase: Secondary | ICD-10-CM | POA: Diagnosis not present

## 2023-04-29 DIAGNOSIS — R471 Dysarthria and anarthria: Secondary | ICD-10-CM | POA: Diagnosis not present

## 2023-04-29 DIAGNOSIS — M6281 Muscle weakness (generalized): Secondary | ICD-10-CM | POA: Diagnosis not present

## 2023-04-29 NOTE — Assessment & Plan Note (Signed)
working with ST, declined diet modification

## 2023-04-29 NOTE — Assessment & Plan Note (Signed)
Dc Omeprazole 2/2 on Plavix, start Pantoprazole.

## 2023-04-29 NOTE — Assessment & Plan Note (Signed)
Blood pressure is controlled,  takes Olmesartan

## 2023-04-29 NOTE — Progress Notes (Signed)
Location:   [facility] Room Number: 918-A Place of Service:  ALF (13) Provider: Arna Snipe Maitri Schnoebelen NP  Chilton Greathouse, MD  Patient Care Team: Chilton Greathouse, MD as PCP - General (Internal Medicine)  Extended Emergency Contact Information Primary Emergency Contact: Jaquelyn Bitter of [place] Home Phone: [phone] Mobile Phone: [phone] Relation: Nephew Secondary Emergency Contact: Ferne Coe [place] of [place] Home Phone: [phone] Relation: Other  Code Status: DNR Goals of care: Advanced Directive information    04/29/2023    3:18 PM  Advanced Directives  Does Patient Have a Medical Advance Directive? No  Would patient like information on creating a medical advance directive? No - Patient declined     Chief Complaint  Patient presents with   Acute Visit     weight loss.    HPI:  Pt is a 89 y.o. female seen today for an acute visit for dysphagia, weight loss  Dysphagia, working with ST, declined diet modification  Weight loss, about #20Ibs in hte past 3-4 months, #10ibs in the past month.   Edema, not apparent, off hydrochlorothiazide 12.5 mg every day             HLD on statin             HTN, takes Olmesartan             Hx of CVA/Dysarthria, severe, on Plavix, ASA, Statin             GERD, switched from Omeprazole to Pantoprazole. Hgb 10.3 03/30/23             Cognitive impairment, MMSE 24/30 03/29/23  CKD Bun/creat 24/1.51 03/30/23               Past Medical History:  Diagnosis Date   Arthritis    Edema    bilateral ankles   History of CVA (cerebrovascular accident) 02/11/2023   02/11/23 MOSE DNR, limited interventions, ABT/IVF if indicated, no feeding tube.      History of urethral stricture    freq/ urge   HOH (hard of hearing)    both ears   Hypertension    elevates legs    Urethral stricture    Urinary retention    Wears hearing aid    Past Surgical History:  Procedure Laterality Date   CYSTO/ URETHRAL  DILATION/ FULGERATION OF BLEEDER  10-16-10   CYSTOSCOPY WITH URETHRAL DILATATION  07/02/2011   Procedure: CYSTOSCOPY WITH URETHRAL DILATATION;  Surgeon: Anner Crete;  [facility];  Service: Urology;  Laterality: N/A;   CYSTOSCOPY WITH URETHRAL DILATATION N/A 08/24/2013   Procedure: CYSTOSCOPY WITH URETHRAL DILATATION;  Surgeon: Bjorn Pippin, MD;  Location: [facility];  Service: Urology;  Laterality: N/A;   CYSTOSCOPY WITH URETHRAL DILATATION N/A 05/02/2015   Procedure: CYSTOSCOPY WITH URETHRAL DILATATION, FULGURATION BLADDER NECK;  Surgeon: Bjorn Pippin, MD;  Location: [facility] ;  Service: Urology;  Laterality: N/A;   CYSTOSCOPY WITH URETHRAL DILATATION N/A 08/03/2017   Procedure: CYSTOSCOPY WITH URETHRAL DILATATION;  Surgeon: Bjorn Pippin, MD;  Location: [facility];  Service: Urology;  Laterality: N/A;   ORIF ORBITAL FRACTURE Right 07/29/2015   Procedure: RIGHT ORBITAL FLOOR REPAIR ;  Surgeon: Christia Reading, MD;  Location: [facility];  Service: ENT;  Laterality: Right;    Allergies  Allergen Reactions   Furosemide Other (See Comments)    Excessive Diarrhea.    Allergies as of 04/29/2023  Reactions   Furosemide Other (See Comments)   Excessive Diarrhea.        Medication List        Accurate as of April 29, 2023  4:14 PM. If you have any questions, ask your nurse or doctor.          acetaminophen 325 MG tablet Commonly known as: TYLENOL Take 325 mg by mouth every 6 (six) hours as needed for headache.   aspirin 81 MG tablet Take 81 mg by mouth at bedtime.   Calcium Citrate + 315-5 MG-MCG Tabs Generic drug: Calcium Citrate-Vitamin D Take 1 tablet by mouth daily.   Cholecalciferol 50 MCG (2000 UT) Tabs Take 1 tablet by mouth daily.   clopidogrel 75 MG tablet Commonly known as: PLAVIX Take 75 mg by mouth daily.   diclofenac sodium 1 % Gel Commonly known as: VOLTAREN Apply  topically as needed.   multivitamin with minerals Tabs tablet Take 1 tablet by mouth daily.   olmesartan-hydrochlorothiazide 20-12.5 MG tablet Commonly known as: BENICAR HCT Take 1 tablet by mouth daily.   omeprazole 40 MG capsule Commonly known as: PRILOSEC Take 40 mg by mouth daily.   simvastatin 20 MG tablet Commonly known as: ZOCOR Take 20 mg by mouth at bedtime.        Review of Systems  Constitutional:  Negative for appetite change, fatigue and fever.  HENT:  Positive for trouble swallowing. Negative for congestion.   Respiratory:  Negative for cough, shortness of breath and wheezing.   Cardiovascular:  Negative for chest pain, palpitations and leg swelling.  Gastrointestinal:  Negative for abdominal pain.  Genitourinary:  Negative for dysuria and urgency.  Musculoskeletal:  Positive for arthralgias and gait problem.  Skin:  Negative for color change.  Neurological:  Positive for speech difficulty. Negative for syncope and headaches.  Psychiatric/Behavioral:  Negative for behavioral problems. The patient is not nervous/anxious.     Immunization History  Administered Date(s) Administered   DTaP 02/20/2008   Influenza-Unspecified 05/17/2010   Pneumococcal-Unspecified 12/15/2005   Tdap 07/15/2015   Pertinent  Health Maintenance Due  Topic Date Due   DEXA SCAN  Never done   INFLUENZA VACCINE  03/18/2023      12/24/2020   10:29 AM  Fall Risk  (RETIRED) Patient Fall Risk Level High fall risk   Functional Status Survey:    Vitals:   04/29/23 1514  BP: 118/68  Pulse: 72  Resp: 18  Temp: (!) 97.4 F (36.3 C)  SpO2: 95%  Weight: 118 lb (53.5 kg)  Height: 5\' 2"  (1.575 m)   Body mass index is 21.58 kg/m. Physical Exam Vitals reviewed.  Constitutional:      Appearance: Normal appearance.  HENT:     Head: Normocephalic and atraumatic.     Nose: Nose normal.     Mouth/Throat:     Mouth: Mucous membranes are moist.  Eyes:     Extraocular Movements:  Extraocular movements intact.     Conjunctiva/sclera: Conjunctivae normal.     Pupils: Pupils are equal, round, and reactive to light.  Cardiovascular:     Rate and Rhythm: Normal rate and regular rhythm.     Heart sounds: No murmur heard. Pulmonary:     Effort: Pulmonary effort is normal.     Breath sounds: No rales.  Abdominal:     General: Bowel sounds are normal.     Palpations: Abdomen is soft.     Tenderness: There is no abdominal tenderness.  Musculoskeletal:  Cervical back: Normal range of motion and neck supple.     Right lower leg: No edema.     Left lower leg: No edema.  Skin:    General: Skin is warm and dry.     Findings: No bruising.  Neurological:     General: No focal deficit present.     Mental Status: She is alert. Mental status is at baseline.     Gait: Gait abnormal.     Comments: Oriented to person, uses power w/c to get around.   Psychiatric:        Mood and Affect: Mood normal.        Behavior: Behavior normal.     Labs reviewed: No results for input(s): "NA", "K", "CL", "CO2", "GLUCOSE", "BUN", "CREATININE", "CALCIUM", "MG", "PHOS" in the last 8760 hours. No results for input(s): "AST", "ALT", "ALKPHOS", "BILITOT", "PROT", "ALBUMIN" in the last 8760 hours. No results for input(s): "WBC", "NEUTROABS", "HGB", "HCT", "MCV", "PLT" in the last 8760 hours. No results found for: "TSH" No results found for: "HGBA1C" No results found for: "CHOL", "HDL", "LDLCALC", "LDLDIRECT", "TRIG", "CHOLHDL"  Significant Diagnostic Results in last 30 days:  No results found.  Assessment/Plan: Weight loss 04/27/23 AMA pureed food with thick liquid, refusing meals, ST not making progress, on boost, Wt 118 04/19/23, 125 04/07/23, 130 02/24/23, 135 12/16/22, 142 11/16/22, 144 10/28/22 Update CBC/diff, CMP/eGFR, TSH  GERD (gastroesophageal reflux disease) Dc Omeprazole 2/2 on Plavix, start Pantoprazole.   Dysphagia working with ST, declined diet modification  Edema No  apparent, dc hydrochlorothiazide 12.5 mg every day  Hypertension Blood pressure is controlled,  takes Olmesartan  History of CVA (cerebrovascular accident)  Hx of CVA/Dysarthria, severe, on Plavix, ASA, Statin, MOST no feeding tube  MCI (mild cognitive impairment) MMSE 24/30 03/29/23    Family/ staff Communication: plan of care reviewed with the patient and charge nurse.   Labs/tests ordered:  CBC/diff, CMP/eGFR, TSH

## 2023-04-29 NOTE — Assessment & Plan Note (Signed)
MMSE 24/30 03/29/23

## 2023-04-29 NOTE — Assessment & Plan Note (Signed)
Hx of CVA/Dysarthria, severe, on Plavix, ASA, Statin, MOST no feeding tube

## 2023-04-29 NOTE — Assessment & Plan Note (Addendum)
No apparent, dc hydrochlorothiazide 12.5 mg every day

## 2023-04-29 NOTE — Assessment & Plan Note (Signed)
04/27/23 AMA pureed food with thick liquid, refusing meals, ST not making progress, on boost, Wt 118 04/19/23, 125 04/07/23, 130 02/24/23, 135 12/16/22, 142 11/16/22, 144 10/28/22 Update CBC/diff, CMP/eGFR, TSH

## 2023-04-30 DIAGNOSIS — I10 Essential (primary) hypertension: Secondary | ICD-10-CM | POA: Diagnosis not present

## 2023-04-30 DIAGNOSIS — R1312 Dysphagia, oropharyngeal phase: Secondary | ICD-10-CM | POA: Diagnosis not present

## 2023-04-30 DIAGNOSIS — R29898 Other symptoms and signs involving the musculoskeletal system: Secondary | ICD-10-CM | POA: Diagnosis not present

## 2023-04-30 DIAGNOSIS — R2681 Unsteadiness on feet: Secondary | ICD-10-CM | POA: Diagnosis not present

## 2023-04-30 DIAGNOSIS — M6281 Muscle weakness (generalized): Secondary | ICD-10-CM | POA: Diagnosis not present

## 2023-04-30 DIAGNOSIS — R471 Dysarthria and anarthria: Secondary | ICD-10-CM | POA: Diagnosis not present

## 2023-04-30 DIAGNOSIS — R41841 Cognitive communication deficit: Secondary | ICD-10-CM | POA: Diagnosis not present

## 2023-04-30 LAB — CBC AND DIFFERENTIAL
HCT: 29 — AB (ref 36–46)
Hemoglobin: 9.6 — AB (ref 12.0–16.0)
Neutrophils Absolute: 3819
Platelets: 261 10*3/uL (ref 150–400)
WBC: 5.7

## 2023-04-30 LAB — HEPATIC FUNCTION PANEL
ALT: 6 U/L — AB (ref 7–35)
AST: 11 — AB (ref 13–35)
Alkaline Phosphatase: 51 (ref 25–125)
Bilirubin, Total: 0.5

## 2023-04-30 LAB — COMPREHENSIVE METABOLIC PANEL
Albumin: 3.2 — AB (ref 3.5–5.0)
Calcium: 8.8 (ref 8.7–10.7)
Globulin: 2.8
eGFR: 10

## 2023-04-30 LAB — CBC: RBC: 3.13 — AB (ref 3.87–5.11)

## 2023-04-30 LAB — BASIC METABOLIC PANEL
BUN: 70 — AB (ref 4–21)
CO2: 23 — AB (ref 13–22)
Chloride: 102 (ref 99–108)
Creatinine: 4 — AB (ref 0.5–1.1)
Glucose: 102
Potassium: 3.5 meq/L (ref 3.5–5.1)
Sodium: 136 — AB (ref 137–147)

## 2023-04-30 LAB — TSH: TSH: 1.67 (ref 0.41–5.90)

## 2023-05-03 DIAGNOSIS — R1312 Dysphagia, oropharyngeal phase: Secondary | ICD-10-CM | POA: Diagnosis not present

## 2023-05-03 DIAGNOSIS — M6281 Muscle weakness (generalized): Secondary | ICD-10-CM | POA: Diagnosis not present

## 2023-05-03 DIAGNOSIS — R29898 Other symptoms and signs involving the musculoskeletal system: Secondary | ICD-10-CM | POA: Diagnosis not present

## 2023-05-03 DIAGNOSIS — R41841 Cognitive communication deficit: Secondary | ICD-10-CM | POA: Diagnosis not present

## 2023-05-03 DIAGNOSIS — R2681 Unsteadiness on feet: Secondary | ICD-10-CM | POA: Diagnosis not present

## 2023-05-03 DIAGNOSIS — R471 Dysarthria and anarthria: Secondary | ICD-10-CM | POA: Diagnosis not present

## 2023-05-04 DIAGNOSIS — R29898 Other symptoms and signs involving the musculoskeletal system: Secondary | ICD-10-CM | POA: Diagnosis not present

## 2023-05-04 DIAGNOSIS — R471 Dysarthria and anarthria: Secondary | ICD-10-CM | POA: Diagnosis not present

## 2023-05-04 DIAGNOSIS — R2681 Unsteadiness on feet: Secondary | ICD-10-CM | POA: Diagnosis not present

## 2023-05-04 DIAGNOSIS — M6281 Muscle weakness (generalized): Secondary | ICD-10-CM | POA: Diagnosis not present

## 2023-05-04 DIAGNOSIS — R41841 Cognitive communication deficit: Secondary | ICD-10-CM | POA: Diagnosis not present

## 2023-05-04 DIAGNOSIS — R1312 Dysphagia, oropharyngeal phase: Secondary | ICD-10-CM | POA: Diagnosis not present

## 2023-05-05 DIAGNOSIS — R471 Dysarthria and anarthria: Secondary | ICD-10-CM | POA: Diagnosis not present

## 2023-05-05 DIAGNOSIS — R29898 Other symptoms and signs involving the musculoskeletal system: Secondary | ICD-10-CM | POA: Diagnosis not present

## 2023-05-05 DIAGNOSIS — M6281 Muscle weakness (generalized): Secondary | ICD-10-CM | POA: Diagnosis not present

## 2023-05-05 DIAGNOSIS — R2681 Unsteadiness on feet: Secondary | ICD-10-CM | POA: Diagnosis not present

## 2023-05-05 DIAGNOSIS — R1312 Dysphagia, oropharyngeal phase: Secondary | ICD-10-CM | POA: Diagnosis not present

## 2023-05-05 DIAGNOSIS — R41841 Cognitive communication deficit: Secondary | ICD-10-CM | POA: Diagnosis not present

## 2023-05-06 DIAGNOSIS — R29898 Other symptoms and signs involving the musculoskeletal system: Secondary | ICD-10-CM | POA: Diagnosis not present

## 2023-05-06 DIAGNOSIS — R2681 Unsteadiness on feet: Secondary | ICD-10-CM | POA: Diagnosis not present

## 2023-05-06 DIAGNOSIS — R471 Dysarthria and anarthria: Secondary | ICD-10-CM | POA: Diagnosis not present

## 2023-05-06 DIAGNOSIS — M6281 Muscle weakness (generalized): Secondary | ICD-10-CM | POA: Diagnosis not present

## 2023-05-06 DIAGNOSIS — R1312 Dysphagia, oropharyngeal phase: Secondary | ICD-10-CM | POA: Diagnosis not present

## 2023-05-06 DIAGNOSIS — R41841 Cognitive communication deficit: Secondary | ICD-10-CM | POA: Diagnosis not present

## 2023-05-07 DIAGNOSIS — R471 Dysarthria and anarthria: Secondary | ICD-10-CM | POA: Diagnosis not present

## 2023-05-07 DIAGNOSIS — R1312 Dysphagia, oropharyngeal phase: Secondary | ICD-10-CM | POA: Diagnosis not present

## 2023-05-07 DIAGNOSIS — R2681 Unsteadiness on feet: Secondary | ICD-10-CM | POA: Diagnosis not present

## 2023-05-07 DIAGNOSIS — R29898 Other symptoms and signs involving the musculoskeletal system: Secondary | ICD-10-CM | POA: Diagnosis not present

## 2023-05-07 DIAGNOSIS — R41841 Cognitive communication deficit: Secondary | ICD-10-CM | POA: Diagnosis not present

## 2023-05-07 DIAGNOSIS — M6281 Muscle weakness (generalized): Secondary | ICD-10-CM | POA: Diagnosis not present

## 2023-05-11 DIAGNOSIS — R471 Dysarthria and anarthria: Secondary | ICD-10-CM | POA: Diagnosis not present

## 2023-05-11 DIAGNOSIS — R41841 Cognitive communication deficit: Secondary | ICD-10-CM | POA: Diagnosis not present

## 2023-05-11 DIAGNOSIS — R1312 Dysphagia, oropharyngeal phase: Secondary | ICD-10-CM | POA: Diagnosis not present

## 2023-05-11 DIAGNOSIS — R2681 Unsteadiness on feet: Secondary | ICD-10-CM | POA: Diagnosis not present

## 2023-05-11 DIAGNOSIS — R29898 Other symptoms and signs involving the musculoskeletal system: Secondary | ICD-10-CM | POA: Diagnosis not present

## 2023-05-11 DIAGNOSIS — M6281 Muscle weakness (generalized): Secondary | ICD-10-CM | POA: Diagnosis not present

## 2023-05-12 DIAGNOSIS — R29898 Other symptoms and signs involving the musculoskeletal system: Secondary | ICD-10-CM | POA: Diagnosis not present

## 2023-05-12 DIAGNOSIS — R471 Dysarthria and anarthria: Secondary | ICD-10-CM | POA: Diagnosis not present

## 2023-05-12 DIAGNOSIS — R41841 Cognitive communication deficit: Secondary | ICD-10-CM | POA: Diagnosis not present

## 2023-05-12 DIAGNOSIS — R2681 Unsteadiness on feet: Secondary | ICD-10-CM | POA: Diagnosis not present

## 2023-05-12 DIAGNOSIS — R1312 Dysphagia, oropharyngeal phase: Secondary | ICD-10-CM | POA: Diagnosis not present

## 2023-05-12 DIAGNOSIS — M6281 Muscle weakness (generalized): Secondary | ICD-10-CM | POA: Diagnosis not present

## 2023-05-13 DIAGNOSIS — R1312 Dysphagia, oropharyngeal phase: Secondary | ICD-10-CM | POA: Diagnosis not present

## 2023-05-13 DIAGNOSIS — M6281 Muscle weakness (generalized): Secondary | ICD-10-CM | POA: Diagnosis not present

## 2023-05-13 DIAGNOSIS — R29898 Other symptoms and signs involving the musculoskeletal system: Secondary | ICD-10-CM | POA: Diagnosis not present

## 2023-05-13 DIAGNOSIS — R471 Dysarthria and anarthria: Secondary | ICD-10-CM | POA: Diagnosis not present

## 2023-05-13 DIAGNOSIS — R41841 Cognitive communication deficit: Secondary | ICD-10-CM | POA: Diagnosis not present

## 2023-05-13 DIAGNOSIS — R2681 Unsteadiness on feet: Secondary | ICD-10-CM | POA: Diagnosis not present

## 2023-05-18 ENCOUNTER — Encounter: Payer: Self-pay | Admitting: Nurse Practitioner

## 2023-05-18 ENCOUNTER — Non-Acute Institutional Stay: Payer: Self-pay | Admitting: Nurse Practitioner

## 2023-05-18 DIAGNOSIS — R471 Dysarthria and anarthria: Secondary | ICD-10-CM | POA: Diagnosis not present

## 2023-05-18 DIAGNOSIS — M6281 Muscle weakness (generalized): Secondary | ICD-10-CM | POA: Diagnosis not present

## 2023-05-18 DIAGNOSIS — R1312 Dysphagia, oropharyngeal phase: Secondary | ICD-10-CM | POA: Diagnosis not present

## 2023-05-18 DIAGNOSIS — Z8673 Personal history of transient ischemic attack (TIA), and cerebral infarction without residual deficits: Secondary | ICD-10-CM | POA: Diagnosis not present

## 2023-05-18 DIAGNOSIS — I10 Essential (primary) hypertension: Secondary | ICD-10-CM

## 2023-05-18 DIAGNOSIS — R1310 Dysphagia, unspecified: Secondary | ICD-10-CM

## 2023-05-18 DIAGNOSIS — R41841 Cognitive communication deficit: Secondary | ICD-10-CM | POA: Diagnosis not present

## 2023-05-18 DIAGNOSIS — G3184 Mild cognitive impairment, so stated: Secondary | ICD-10-CM | POA: Diagnosis not present

## 2023-05-18 DIAGNOSIS — N1831 Chronic kidney disease, stage 3a: Secondary | ICD-10-CM

## 2023-05-18 DIAGNOSIS — R29898 Other symptoms and signs involving the musculoskeletal system: Secondary | ICD-10-CM | POA: Diagnosis not present

## 2023-05-18 DIAGNOSIS — R2681 Unsteadiness on feet: Secondary | ICD-10-CM | POA: Diagnosis not present

## 2023-05-18 DIAGNOSIS — K219 Gastro-esophageal reflux disease without esophagitis: Secondary | ICD-10-CM | POA: Diagnosis not present

## 2023-05-18 DIAGNOSIS — R609 Edema, unspecified: Secondary | ICD-10-CM | POA: Diagnosis not present

## 2023-05-18 DIAGNOSIS — E782 Mixed hyperlipidemia: Secondary | ICD-10-CM | POA: Diagnosis not present

## 2023-05-18 DIAGNOSIS — N183 Chronic kidney disease, stage 3 unspecified: Secondary | ICD-10-CM | POA: Insufficient documentation

## 2023-05-18 DIAGNOSIS — R634 Abnormal weight loss: Secondary | ICD-10-CM

## 2023-05-18 NOTE — Assessment & Plan Note (Addendum)
runs low, off meds, VS 12 hours.

## 2023-05-18 NOTE — Assessment & Plan Note (Signed)
on statin. 

## 2023-05-18 NOTE — Assessment & Plan Note (Signed)
MMSE 24/30 03/29/23

## 2023-05-18 NOTE — Assessment & Plan Note (Signed)
switched from Omeprazole to Pantoprazole. Hgb 10.3 03/30/23

## 2023-05-18 NOTE — Assessment & Plan Note (Signed)
--  not apparent.

## 2023-05-18 NOTE — Progress Notes (Unsigned)
Location:  [facility] Room Number: 918-A Place of Service:  ALF [phone]) Provider:  Eliott Amparan X, NP    Patient Care Team: Chilton Greathouse, MD as PCP - General (Internal Medicine)  Extended Emergency Contact Information Primary Emergency Contact: Beatrix Fetters [place] Home Phone: [phone] Mobile Phone: [phone] Relation: Nephew Secondary Emergency Contact: Ferne Coe [place] Home Phone: [phone] Relation: Other  Code Status:  DNR Goals of care: Advanced Directive information    05/18/2023   11:54 AM  Advanced Directives  Does Patient Have a Medical Advance Directive? No  Would patient like information on creating a medical advance directive? No - Patient declined     Chief Complaint  Patient presents with  . Medical Management of Chronic Issues    Routine Visit  . Immunizations    Shingrix, Pneumonia,Influenza and Covid  . Quality Metric Gaps    Dexa Scan and Medicare Annual Wellness    HPI:  Pt is a 89 y.o. female seen today for medical management of chronic diseases.      Dysphagia, working with ST, declined diet modification             Weight loss, #18Ibs in the past 5 months, none in the past month             Edema, not apparent             HLD on statin             HTN, runs low, off meds.              Hx of CVA/Dysarthria, severe, on Plavix, ASA, Statin             GERD, switched from Omeprazole to Pantoprazole. Hgb 10.3 03/30/23             Cognitive impairment, MMSE 24/30 03/29/23             CKD repeat CMP/eGFR               Past Medical History:  Diagnosis Date  . Arthritis   . Edema    bilateral ankles  . History of CVA (cerebrovascular accident) 02/11/2023   02/11/23 MOSE DNR, limited interventions, ABT/IVF if indicated, no feeding tube.     Marland Kitchen History of urethral stricture    freq/ urge  . HOH (hard of hearing)    both ears  . Hypertension    elevates legs   . Urethral  stricture   . Urinary retention   . Wears hearing aid    Past Surgical History:  Procedure Laterality Date  . CYSTO/ URETHRAL DILATION/ FULGERATION OF BLEEDER  10-16-10  . CYSTOSCOPY WITH URETHRAL DILATATION  07/02/2011   Procedure: CYSTOSCOPY WITH URETHRAL DILATATION;  Surgeon: Anner Crete;  Location: [facility];  Service: Urology;  Laterality: N/A;  . CYSTOSCOPY WITH URETHRAL DILATATION N/A 08/24/2013   Procedure: CYSTOSCOPY WITH URETHRAL DILATATION;  Surgeon: Bjorn Pippin, MD;  Location: [facility];  Service: Urology;  Laterality: N/A;  . CYSTOSCOPY WITH URETHRAL DILATATION N/A 05/02/2015   Procedure: CYSTOSCOPY WITH URETHRAL DILATATION, FULGURATION BLADDER NECK;  Surgeon: Bjorn Pippin, MD;  Location: [facility];  Service: Urology;  Laterality: N/A;  . CYSTOSCOPY WITH URETHRAL DILATATION N/A 08/03/2017   Procedure: CYSTOSCOPY WITH URETHRAL DILATATION;  Surgeon: Bjorn Pippin, MD;  Location: [facility]  Service: Urology;  Laterality: N/A;  . ORIF ORBITAL  FRACTURE Right 07/29/2015   Procedure: RIGHT ORBITAL FLOOR REPAIR ;  Surgeon: Christia Reading, MD;  Location: [facility];  Service: ENT;  Laterality: Right;    Allergies  Allergen Reactions  . Furosemide Other (See Comments)    Excessive Diarrhea.    Outpatient Encounter Medications as of 05/18/2023  Medication Sig  . acetaminophen (TYLENOL) 325 MG tablet Take 325 mg by mouth every 6 (six) hours as needed for headache.  Marland Kitchen aspirin 81 MG tablet Take 81 mg by mouth at bedtime.  . Calcium Citrate-Vitamin D (CALCIUM CITRATE +) 315-5 MG-MCG TABS Take 1 tablet by mouth daily.  . Cholecalciferol 50 MCG (2000 UT) TABS Take 1 tablet by mouth daily.  . clopidogrel (PLAVIX) 75 MG tablet Take 75 mg by mouth daily.  . diclofenac sodium (VOLTAREN) 1 % GEL Apply topically as needed.  . Multiple Vitamin (MULTIVITAMIN WITH MINERALS) TABS tablet Take 1 tablet by mouth daily.  Marland Kitchen  olmesartan-hydrochlorothiazide (BENICAR HCT) 20-12.5 MG tablet Take 1 tablet by mouth daily.  . pantoprazole (PROTONIX) 40 MG tablet Take 40 mg by mouth daily.  . simvastatin (ZOCOR) 20 MG tablet Take 20 mg by mouth at bedtime.    . [DISCONTINUED] omeprazole (PRILOSEC) 40 MG capsule Take 40 mg by mouth daily.   No facility-administered encounter medications on file as of 05/18/2023.    Review of Systems  Constitutional:  Negative for appetite change, fatigue and fever.  HENT:  Positive for trouble swallowing. Negative for congestion.   Respiratory:  Negative for cough, shortness of breath and wheezing.   Cardiovascular:  Negative for chest pain, palpitations and leg swelling.  Gastrointestinal:  Negative for abdominal pain.  Genitourinary:  Negative for dysuria and urgency.  Musculoskeletal:  Positive for arthralgias and gait problem.  Skin:  Negative for color change.  Neurological:  Positive for speech difficulty. Negative for syncope and headaches.  Psychiatric/Behavioral:  Negative for behavioral problems. The patient is not nervous/anxious.     Immunization History  Administered Date(s) Administered  . DTaP 02/20/2008  . Influenza-Unspecified 05/17/2010  . Pneumococcal-Unspecified 12/15/2005  . Tdap 07/15/2015   Pertinent  Health Maintenance Due  Topic Date Due  . DEXA SCAN  Never done  . INFLUENZA VACCINE  03/18/2023      12/24/2020   10:29 AM  Fall Risk  (RETIRED) Patient Fall Risk Level High fall risk   Functional Status Survey:    Vitals:   05/18/23 1149  BP: (!) 103/58  Pulse: 65  Resp: 16  Temp: (!) 97.4 F (36.3 C)  SpO2: 96%  Weight: 118 lb (53.5 kg)  Height: 5\' 2"  (1.575 m)   Body mass index is 21.58 kg/m. Physical Exam Vitals reviewed.  Constitutional:      Appearance: Normal appearance.  HENT:     Head: Normocephalic and atraumatic.     Nose: Nose normal.     Mouth/Throat:     Mouth: Mucous membranes are moist.  Eyes:     Extraocular  Movements: Extraocular movements intact.     Conjunctiva/sclera: Conjunctivae normal.     Pupils: Pupils are equal, round, and reactive to light.  Cardiovascular:     Rate and Rhythm: Normal rate and regular rhythm.     Heart sounds: No murmur heard. Pulmonary:     Effort: Pulmonary effort is normal.     Breath sounds: No rales.  Abdominal:     General: Bowel sounds are normal.     Palpations: Abdomen is soft.  Tenderness: There is no abdominal tenderness.  Musculoskeletal:     Cervical back: Normal range of motion and neck supple.     Right lower leg: No edema.     Left lower leg: No edema.  Skin:    General: Skin is warm and dry.     Coloration: Skin is pale.  Neurological:     General: No focal deficit present.     Mental Status: She is alert. Mental status is at baseline.     Gait: Gait abnormal.     Comments: Oriented to person, uses power w/c to get around.   Psychiatric:        Mood and Affect: Mood normal.        Behavior: Behavior normal.    Labs reviewed: Recent Labs    03/30/23 0000 04/30/23 0000  NA 134* 136*  K 4.1 3.5  CL 97* 102  CO2 28* 23*  BUN 37* 70*  CREATININE 1.5* 4.0*  CALCIUM 9.5 8.8   Recent Labs    03/30/23 0000 04/30/23 0000  AST 14 11*  ALT 13 6*  ALKPHOS 58 51  ALBUMIN 4.0 3.2*   Recent Labs    03/30/23 0000 04/30/23 0000  WBC 6.2 5.7  NEUTROABS 4,644.00 3,819.00  HGB 11.2* 9.6*  HCT 34* 29*  PLT 322 261   Lab Results  Component Value Date   TSH 1.67 04/30/2023   No results found for: "HGBA1C" Lab Results  Component Value Date   CHOL 141 03/30/2023   HDL 49 03/30/2023   LDLCALC 75 03/30/2023   TRIG 85 03/30/2023    Significant Diagnostic Results in last 30 days:  No results found.  Assessment/Plan CKD (chronic kidney disease) stage 3, GFR 30-59 ml/min (HCC) Repeat CMP/eGFR  Hypertension runs low, off meds, VS 12 hours.   Dysphagia working with ST, declined diet modification  Weight loss #18Ibs in  the past 5 months, none in the past month  Edema not apparent  Hyperlipidemia on statin  History of CVA (cerebrovascular accident) Hx of CVA/Dysarthria, severe, on Plavix, ASA, Statin, obtain CBC/diff  GERD (gastroesophageal reflux disease)  switched from Omeprazole to Pantoprazole. Hgb 10.3 03/30/23    Family/ staff Communication: plan of care reviewed with the patient and charge nurse.   Labs/tests ordered:  CBC/diff, CMP/eGFR  Time spend 40 minutes.

## 2023-05-18 NOTE — Assessment & Plan Note (Addendum)
Bun/creat 60/2.3 05/18/23, dc Losartan/hydrochlorothiazide 2/2 low Bp and elevated Bun/creat and no apparent edema, encourage oral fluid intake, f/u BMP

## 2023-05-18 NOTE — Assessment & Plan Note (Signed)
working with ST, declined diet modification

## 2023-05-18 NOTE — Assessment & Plan Note (Signed)
Hx of CVA/Dysarthria, severe, on Plavix, ASA, Statin, obtain CBC/diff High risk for aspiration, dehydration 2/2 dysphagia.

## 2023-05-18 NOTE — Assessment & Plan Note (Signed)
#  18Ibs in the past 5 months, none in the past month

## 2023-05-20 DIAGNOSIS — R41841 Cognitive communication deficit: Secondary | ICD-10-CM | POA: Diagnosis not present

## 2023-05-20 DIAGNOSIS — M6281 Muscle weakness (generalized): Secondary | ICD-10-CM | POA: Diagnosis not present

## 2023-05-20 DIAGNOSIS — R29898 Other symptoms and signs involving the musculoskeletal system: Secondary | ICD-10-CM | POA: Diagnosis not present

## 2023-05-20 DIAGNOSIS — R471 Dysarthria and anarthria: Secondary | ICD-10-CM | POA: Diagnosis not present

## 2023-05-20 DIAGNOSIS — R1312 Dysphagia, oropharyngeal phase: Secondary | ICD-10-CM | POA: Diagnosis not present

## 2023-05-20 DIAGNOSIS — R2681 Unsteadiness on feet: Secondary | ICD-10-CM | POA: Diagnosis not present

## 2023-05-20 DIAGNOSIS — R634 Abnormal weight loss: Secondary | ICD-10-CM | POA: Diagnosis not present

## 2023-05-25 DIAGNOSIS — R29898 Other symptoms and signs involving the musculoskeletal system: Secondary | ICD-10-CM | POA: Diagnosis not present

## 2023-05-25 DIAGNOSIS — R471 Dysarthria and anarthria: Secondary | ICD-10-CM | POA: Diagnosis not present

## 2023-05-25 DIAGNOSIS — R1312 Dysphagia, oropharyngeal phase: Secondary | ICD-10-CM | POA: Diagnosis not present

## 2023-05-25 DIAGNOSIS — M6281 Muscle weakness (generalized): Secondary | ICD-10-CM | POA: Diagnosis not present

## 2023-05-25 DIAGNOSIS — R41841 Cognitive communication deficit: Secondary | ICD-10-CM | POA: Diagnosis not present

## 2023-05-25 DIAGNOSIS — R2681 Unsteadiness on feet: Secondary | ICD-10-CM | POA: Diagnosis not present

## 2023-05-26 DIAGNOSIS — R29898 Other symptoms and signs involving the musculoskeletal system: Secondary | ICD-10-CM | POA: Diagnosis not present

## 2023-05-26 DIAGNOSIS — R2681 Unsteadiness on feet: Secondary | ICD-10-CM | POA: Diagnosis not present

## 2023-05-26 DIAGNOSIS — M6281 Muscle weakness (generalized): Secondary | ICD-10-CM | POA: Diagnosis not present

## 2023-05-26 DIAGNOSIS — R41841 Cognitive communication deficit: Secondary | ICD-10-CM | POA: Diagnosis not present

## 2023-05-26 DIAGNOSIS — R1312 Dysphagia, oropharyngeal phase: Secondary | ICD-10-CM | POA: Diagnosis not present

## 2023-05-26 DIAGNOSIS — R471 Dysarthria and anarthria: Secondary | ICD-10-CM | POA: Diagnosis not present

## 2023-05-27 DIAGNOSIS — I959 Hypotension, unspecified: Secondary | ICD-10-CM | POA: Diagnosis not present

## 2023-06-01 DIAGNOSIS — R41841 Cognitive communication deficit: Secondary | ICD-10-CM | POA: Diagnosis not present

## 2023-06-01 DIAGNOSIS — M6281 Muscle weakness (generalized): Secondary | ICD-10-CM | POA: Diagnosis not present

## 2023-06-01 DIAGNOSIS — R471 Dysarthria and anarthria: Secondary | ICD-10-CM | POA: Diagnosis not present

## 2023-06-01 DIAGNOSIS — R1312 Dysphagia, oropharyngeal phase: Secondary | ICD-10-CM | POA: Diagnosis not present

## 2023-06-01 DIAGNOSIS — R2681 Unsteadiness on feet: Secondary | ICD-10-CM | POA: Diagnosis not present

## 2023-06-01 DIAGNOSIS — R29898 Other symptoms and signs involving the musculoskeletal system: Secondary | ICD-10-CM | POA: Diagnosis not present

## 2023-06-03 DIAGNOSIS — R1312 Dysphagia, oropharyngeal phase: Secondary | ICD-10-CM | POA: Diagnosis not present

## 2023-06-03 DIAGNOSIS — R29898 Other symptoms and signs involving the musculoskeletal system: Secondary | ICD-10-CM | POA: Diagnosis not present

## 2023-06-03 DIAGNOSIS — R471 Dysarthria and anarthria: Secondary | ICD-10-CM | POA: Diagnosis not present

## 2023-06-03 DIAGNOSIS — R41841 Cognitive communication deficit: Secondary | ICD-10-CM | POA: Diagnosis not present

## 2023-06-03 DIAGNOSIS — R2681 Unsteadiness on feet: Secondary | ICD-10-CM | POA: Diagnosis not present

## 2023-06-03 DIAGNOSIS — M6281 Muscle weakness (generalized): Secondary | ICD-10-CM | POA: Diagnosis not present

## 2023-06-08 DIAGNOSIS — R471 Dysarthria and anarthria: Secondary | ICD-10-CM | POA: Diagnosis not present

## 2023-06-08 DIAGNOSIS — M6281 Muscle weakness (generalized): Secondary | ICD-10-CM | POA: Diagnosis not present

## 2023-06-08 DIAGNOSIS — R29898 Other symptoms and signs involving the musculoskeletal system: Secondary | ICD-10-CM | POA: Diagnosis not present

## 2023-06-08 DIAGNOSIS — R1312 Dysphagia, oropharyngeal phase: Secondary | ICD-10-CM | POA: Diagnosis not present

## 2023-06-08 DIAGNOSIS — R2681 Unsteadiness on feet: Secondary | ICD-10-CM | POA: Diagnosis not present

## 2023-06-08 DIAGNOSIS — R41841 Cognitive communication deficit: Secondary | ICD-10-CM | POA: Diagnosis not present

## 2023-06-10 ENCOUNTER — Non-Acute Institutional Stay: Payer: Medicare Other | Admitting: Nurse Practitioner

## 2023-06-10 ENCOUNTER — Encounter: Payer: Self-pay | Admitting: Nurse Practitioner

## 2023-06-10 DIAGNOSIS — W19XXXA Unspecified fall, initial encounter: Secondary | ICD-10-CM | POA: Diagnosis not present

## 2023-06-10 DIAGNOSIS — R471 Dysarthria and anarthria: Secondary | ICD-10-CM | POA: Diagnosis not present

## 2023-06-10 DIAGNOSIS — R634 Abnormal weight loss: Secondary | ICD-10-CM | POA: Diagnosis not present

## 2023-06-10 DIAGNOSIS — M6281 Muscle weakness (generalized): Secondary | ICD-10-CM | POA: Diagnosis not present

## 2023-06-10 DIAGNOSIS — R29898 Other symptoms and signs involving the musculoskeletal system: Secondary | ICD-10-CM | POA: Diagnosis not present

## 2023-06-10 DIAGNOSIS — G3184 Mild cognitive impairment, so stated: Secondary | ICD-10-CM

## 2023-06-10 DIAGNOSIS — R609 Edema, unspecified: Secondary | ICD-10-CM | POA: Diagnosis not present

## 2023-06-10 DIAGNOSIS — R41841 Cognitive communication deficit: Secondary | ICD-10-CM | POA: Diagnosis not present

## 2023-06-10 DIAGNOSIS — Z8673 Personal history of transient ischemic attack (TIA), and cerebral infarction without residual deficits: Secondary | ICD-10-CM | POA: Diagnosis not present

## 2023-06-10 DIAGNOSIS — R1312 Dysphagia, oropharyngeal phase: Secondary | ICD-10-CM | POA: Diagnosis not present

## 2023-06-10 DIAGNOSIS — N1831 Chronic kidney disease, stage 3a: Secondary | ICD-10-CM | POA: Diagnosis not present

## 2023-06-10 DIAGNOSIS — K219 Gastro-esophageal reflux disease without esophagitis: Secondary | ICD-10-CM

## 2023-06-10 DIAGNOSIS — I10 Essential (primary) hypertension: Secondary | ICD-10-CM | POA: Diagnosis not present

## 2023-06-10 DIAGNOSIS — R1310 Dysphagia, unspecified: Secondary | ICD-10-CM

## 2023-06-10 DIAGNOSIS — E782 Mixed hyperlipidemia: Secondary | ICD-10-CM

## 2023-06-10 DIAGNOSIS — R2681 Unsteadiness on feet: Secondary | ICD-10-CM | POA: Diagnosis not present

## 2023-06-10 NOTE — Assessment & Plan Note (Signed)
on statin. 

## 2023-06-10 NOTE — Assessment & Plan Note (Signed)
Bun/creat 44/1.63 05/27/23

## 2023-06-10 NOTE — Assessment & Plan Note (Signed)
the patient was found on floor at doorway, the patient stated she fell and scooted herself to the door. No long c/o pain in her buttocks and hips. X-ray 06/06/23 hips/pelvis unremarkable.

## 2023-06-10 NOTE — Assessment & Plan Note (Signed)
switched from Omeprazole to Pantoprazole. Hgb 10.3 03/30/23

## 2023-06-10 NOTE — Assessment & Plan Note (Addendum)
working with ST, declined diet modification. MOST no feeding tube. Contributory to weight loss?

## 2023-06-10 NOTE — Progress Notes (Addendum)
Location:   [facility] Room Number: [id number] Place of Service:  ALF (13) Provider: Arna Snipe Tamla Winkels NP  Chilton Greathouse, MD  Patient Care Team: Chilton Greathouse, MD as PCP - General (Internal Medicine)  Extended Emergency Contact Information Primary Emergency Contact: Jaquelyn Bitter of [place] Home Phone: [phone] Mobile Phone: [phone] Relation: Nephew Secondary Emergency Contact: Ferne Coe [place] Home Phone: [phone] Relation: Other  Code Status: DNR Goals of care: Advanced Directive information    05/18/2023   11:54 AM  Advanced Directives  Does Patient Have a Medical Advance Directive? No  Would patient like information on creating a medical advance directive? No - Patient declined     Chief Complaint  Patient presents with   Acute Visit    Fall 06/06/23    HPI:  Pt is a 89 y.o. female seen today for an acute visit for the patient was found on floor at doorway, the patient stated she fell and scooted herself to the door. No long c/o pain in her buttocks and hips. X-ray 06/06/23 hips/pelvis unremarkable.     Dysphagia, working with ST, declined diet modification             Weight loss/Adult failure to thrive, ongoing issue.             Edema, not apparent             HLD on statin             HTN, runs low, off meds.              Hx of CVA/Dysarthria, severe, on Plavix, ASA, Statin             GERD, switched from Omeprazole to Pantoprazole. Hgb 10.0 05/18/23             Cognitive impairment, MMSE 24/30 03/29/23             CKD Bun/creat 44/1.63 05/27/23               Past Medical History:  Diagnosis Date   Arthritis    Edema    bilateral ankles   History of CVA (cerebrovascular accident) 02/11/2023   02/11/23 MOSE DNR, limited interventions, ABT/IVF if indicated, no feeding tube.      History of urethral stricture    freq/ urge   HOH (hard of hearing)    both ears   Hypertension    elevates legs     Urethral stricture    Urinary retention    Wears hearing aid    Past Surgical History:  Procedure Laterality Date   CYSTO/ URETHRAL DILATION/ FULGERATION OF BLEEDER  10-16-10   CYSTOSCOPY WITH URETHRAL DILATATION  07/02/2011   Procedure: CYSTOSCOPY WITH URETHRAL DILATATION;  Surgeon: Anner Crete;  Location: [facility];  Service: Urology;  Laterality: N/A;   CYSTOSCOPY WITH URETHRAL DILATATION N/A 08/24/2013   Procedure: CYSTOSCOPY WITH URETHRAL DILATATION;  Surgeon: Bjorn Pippin, MD;  Location: [facility];  Service: Urology;  Laterality: N/A;   CYSTOSCOPY WITH URETHRAL DILATATION N/A 05/02/2015   Procedure: CYSTOSCOPY WITH URETHRAL DILATATION, FULGURATION BLADDER NECK;  Surgeon: Bjorn Pippin, MD;  Location: [facility];  Service: Urology;  Laterality: N/A;   CYSTOSCOPY WITH URETHRAL DILATATION N/A 08/03/2017   Procedure: CYSTOSCOPY WITH URETHRAL DILATATION;  Surgeon: Bjorn Pippin, MD;  Location: [facility];  Service: Urology;  Laterality: N/A;   ORIF ORBITAL FRACTURE Right 07/29/2015  Procedure: RIGHT ORBITAL FLOOR REPAIR ;  Surgeon: Christia Reading, MD;  Location: [facility];  Service: ENT;  Laterality: Right;    Allergies  Allergen Reactions   Furosemide Other (See Comments)    Excessive Diarrhea.    Allergies as of 06/10/2023       Reactions   Furosemide Other (See Comments)   Excessive Diarrhea.        Medication List        Accurate as of June 10, 2023  4:37 PM. If you have any questions, ask your nurse or doctor.          acetaminophen 325 MG tablet Commonly known as: TYLENOL Take 325 mg by mouth every 6 (six) hours as needed for headache.   aspirin 81 MG tablet Take 81 mg by mouth at bedtime.   Calcium Citrate + 315-5 MG-MCG Tabs Generic drug: Calcium Citrate-Vitamin D Take 1 tablet by mouth daily.   Cholecalciferol 50 MCG (2000 UT) Tabs Take 1 tablet by mouth daily.   clopidogrel  75 MG tablet Commonly known as: PLAVIX Take 75 mg by mouth daily.   diclofenac sodium 1 % Gel Commonly known as: VOLTAREN Apply topically as needed.   multivitamin with minerals Tabs tablet Take 1 tablet by mouth daily.   olmesartan-hydrochlorothiazide 20-12.5 MG tablet Commonly known as: BENICAR HCT Take 1 tablet by mouth daily.   pantoprazole 40 MG tablet Commonly known as: PROTONIX Take 40 mg by mouth daily.   simvastatin 20 MG tablet Commonly known as: ZOCOR Take 20 mg by mouth at bedtime.        Review of Systems  Constitutional:  Negative for appetite change, fatigue and fever.  HENT:  Positive for trouble swallowing. Negative for congestion.   Respiratory:  Negative for cough, shortness of breath and wheezing.   Cardiovascular:  Negative for chest pain, palpitations and leg swelling.  Gastrointestinal:  Negative for abdominal pain.  Genitourinary:  Negative for dysuria and urgency.  Musculoskeletal:  Positive for arthralgias and gait problem.  Skin:  Negative for color change.  Neurological:  Positive for speech difficulty. Negative for syncope and headaches.  Psychiatric/Behavioral:  Negative for behavioral problems. The patient is not nervous/anxious.     Immunization History  Administered Date(s) Administered   DTaP 02/20/2008   Influenza-Unspecified 05/17/2010   Pneumococcal-Unspecified 12/15/2005   Tdap 07/15/2015   Pertinent  Health Maintenance Due  Topic Date Due   DEXA SCAN  Never done   INFLUENZA VACCINE  03/18/2023      12/24/2020   10:29 AM  Fall Risk  (RETIRED) Patient Fall Risk Level High fall risk   Functional Status Survey:    Vitals:   06/10/23 1535 06/10/23 1625  BP: (!) 113/57 (!) 93/54  Pulse: 76   Resp: 19   Temp: (!) 97.1 F (36.2 C)   SpO2: 94%   Weight: 112 lb 6.4 oz (51 kg)    Body mass index is 20.56 kg/m. Physical Exam Vitals reviewed.  Constitutional:      Appearance: Normal appearance.     Comments:  Continue weight loss  HENT:     Head: Normocephalic and atraumatic.     Nose: Nose normal.     Mouth/Throat:     Mouth: Mucous membranes are moist.  Eyes:     Extraocular Movements: Extraocular movements intact.     Conjunctiva/sclera: Conjunctivae normal.     Pupils: Pupils are equal, round, and reactive to light.  Cardiovascular:  Rate and Rhythm: Normal rate and regular rhythm.     Heart sounds: No murmur heard. Pulmonary:     Effort: Pulmonary effort is normal.     Breath sounds: No rales.  Abdominal:     General: Bowel sounds are normal.     Palpations: Abdomen is soft.     Tenderness: There is no abdominal tenderness.  Musculoskeletal:     Cervical back: Normal range of motion and neck supple.     Right lower leg: No edema.     Left lower leg: No edema.  Skin:    General: Skin is warm and dry.     Coloration: Skin is pale.  Neurological:     General: No focal deficit present.     Mental Status: She is alert. Mental status is at baseline.     Gait: Gait abnormal.     Comments: Oriented to person, uses power w/c to get around.   Psychiatric:        Mood and Affect: Mood normal.        Behavior: Behavior normal.     Labs reviewed: Recent Labs    03/30/23 0000 04/30/23 0000  NA 134* 136*  K 4.1 3.5  CL 97* 102  CO2 28* 23*  BUN 37* 70*  CREATININE 1.5* 4.0*  CALCIUM 9.5 8.8   Recent Labs    03/30/23 0000 04/30/23 0000  AST 14 11*  ALT 13 6*  ALKPHOS 58 51  ALBUMIN 4.0 3.2*   Recent Labs    03/30/23 0000 04/30/23 0000  WBC 6.2 5.7  NEUTROABS 4,644.00 3,819.00  HGB 11.2* 9.6*  HCT 34* 29*  PLT 322 261   Lab Results  Component Value Date   TSH 1.67 04/30/2023   No results found for: "HGBA1C" Lab Results  Component Value Date   CHOL 141 03/30/2023   HDL 49 03/30/2023   LDLCALC 75 03/30/2023   TRIG 85 03/30/2023    Significant Diagnostic Results in last 30 days:  No results found.  Assessment/Plan: Fall the patient was found on  floor at doorway, the patient stated she fell and scooted herself to the door. No long c/o pain in her buttocks and hips. X-ray 06/06/23 hips/pelvis unremarkable.   Dysphagia working with ST, declined diet modification. MOST no feeding tube. Contributory to weight loss?   Weight loss   Weight loss/Adult failure to thrive, ongoing issue. Needs higher level of care  Edema not apparent  Hyperlipidemia on statin  Hypertension runs low, off meds, update BMP in setting of continuous weight loss, increased frailty/falling, and chronic dysphagia.   History of CVA (cerebrovascular accident) Hx of CVA/Dysarthria, severe, on Plavix, ASA, Statin  GERD (gastroesophageal reflux disease) switched from Omeprazole to Pantoprazole. Hgb 10.0 05/18/23  MCI (mild cognitive impairment)  MMSE 24/30 03/29/23  CKD (chronic kidney disease) stage 3, GFR 30-59 ml/min (HCC) Bun/creat 44/1.63 05/27/23    Family/ staff Communication: plan of care reviewed with the patient and charge nurse.   Labs/tests ordered:  BMP  Time spend 40 minutes.

## 2023-06-10 NOTE — Assessment & Plan Note (Signed)
Weight loss/Adult failure to thrive, ongoing issue. Needs higher level of care

## 2023-06-10 NOTE — Assessment & Plan Note (Signed)
MMSE 24/30 03/29/23

## 2023-06-10 NOTE — Assessment & Plan Note (Addendum)
runs low, off meds, update BMP in setting of continuous weight loss, increased frailty/falling, and chronic dysphagia.

## 2023-06-10 NOTE — Assessment & Plan Note (Signed)
Hx of CVA/Dysarthria, severe, on Plavix, ASA, Statin

## 2023-06-10 NOTE — Assessment & Plan Note (Signed)
--  not apparent.

## 2023-06-11 DIAGNOSIS — I10 Essential (primary) hypertension: Secondary | ICD-10-CM | POA: Diagnosis not present

## 2023-06-11 DIAGNOSIS — I959 Hypotension, unspecified: Secondary | ICD-10-CM | POA: Diagnosis not present

## 2023-06-11 LAB — BASIC METABOLIC PANEL
BUN: 35 — AB (ref 4–21)
CO2: 29 — AB (ref 13–22)
Chloride: 102 (ref 99–108)
Creatinine: 1.4 — AB (ref 0.5–1.1)
Glucose: 98
Potassium: 4.6 meq/L (ref 3.5–5.1)
Sodium: 137 (ref 137–147)

## 2023-06-11 LAB — COMPREHENSIVE METABOLIC PANEL: Calcium: 9 (ref 8.7–10.7)

## 2023-06-15 DIAGNOSIS — R471 Dysarthria and anarthria: Secondary | ICD-10-CM | POA: Diagnosis not present

## 2023-06-15 DIAGNOSIS — R2681 Unsteadiness on feet: Secondary | ICD-10-CM | POA: Diagnosis not present

## 2023-06-15 DIAGNOSIS — R41841 Cognitive communication deficit: Secondary | ICD-10-CM | POA: Diagnosis not present

## 2023-06-15 DIAGNOSIS — R1312 Dysphagia, oropharyngeal phase: Secondary | ICD-10-CM | POA: Diagnosis not present

## 2023-06-15 DIAGNOSIS — R29898 Other symptoms and signs involving the musculoskeletal system: Secondary | ICD-10-CM | POA: Diagnosis not present

## 2023-06-15 DIAGNOSIS — M6281 Muscle weakness (generalized): Secondary | ICD-10-CM | POA: Diagnosis not present

## 2023-06-17 DIAGNOSIS — R471 Dysarthria and anarthria: Secondary | ICD-10-CM | POA: Diagnosis not present

## 2023-06-17 DIAGNOSIS — R2681 Unsteadiness on feet: Secondary | ICD-10-CM | POA: Diagnosis not present

## 2023-06-17 DIAGNOSIS — M6281 Muscle weakness (generalized): Secondary | ICD-10-CM | POA: Diagnosis not present

## 2023-06-17 DIAGNOSIS — R41841 Cognitive communication deficit: Secondary | ICD-10-CM | POA: Diagnosis not present

## 2023-06-17 DIAGNOSIS — R1312 Dysphagia, oropharyngeal phase: Secondary | ICD-10-CM | POA: Diagnosis not present

## 2023-06-17 DIAGNOSIS — R29898 Other symptoms and signs involving the musculoskeletal system: Secondary | ICD-10-CM | POA: Diagnosis not present

## 2023-06-21 DIAGNOSIS — R1312 Dysphagia, oropharyngeal phase: Secondary | ICD-10-CM | POA: Diagnosis not present

## 2023-06-21 DIAGNOSIS — R41841 Cognitive communication deficit: Secondary | ICD-10-CM | POA: Diagnosis not present

## 2023-06-21 DIAGNOSIS — R471 Dysarthria and anarthria: Secondary | ICD-10-CM | POA: Diagnosis not present

## 2023-06-22 DIAGNOSIS — I679 Cerebrovascular disease, unspecified: Secondary | ICD-10-CM | POA: Diagnosis not present

## 2023-06-22 DIAGNOSIS — R634 Abnormal weight loss: Secondary | ICD-10-CM | POA: Diagnosis not present

## 2023-06-22 DIAGNOSIS — K2100 Gastro-esophageal reflux disease with esophagitis, without bleeding: Secondary | ICD-10-CM | POA: Diagnosis not present

## 2023-06-22 DIAGNOSIS — R296 Repeated falls: Secondary | ICD-10-CM | POA: Diagnosis not present

## 2023-06-22 DIAGNOSIS — I10 Essential (primary) hypertension: Secondary | ICD-10-CM | POA: Diagnosis not present

## 2023-06-22 DIAGNOSIS — E7849 Other hyperlipidemia: Secondary | ICD-10-CM | POA: Diagnosis not present

## 2023-06-23 DIAGNOSIS — R296 Repeated falls: Secondary | ICD-10-CM | POA: Diagnosis not present

## 2023-06-23 DIAGNOSIS — I10 Essential (primary) hypertension: Secondary | ICD-10-CM | POA: Diagnosis not present

## 2023-06-23 DIAGNOSIS — R634 Abnormal weight loss: Secondary | ICD-10-CM | POA: Diagnosis not present

## 2023-06-23 DIAGNOSIS — K2100 Gastro-esophageal reflux disease with esophagitis, without bleeding: Secondary | ICD-10-CM | POA: Diagnosis not present

## 2023-06-23 DIAGNOSIS — E7849 Other hyperlipidemia: Secondary | ICD-10-CM | POA: Diagnosis not present

## 2023-06-23 DIAGNOSIS — I679 Cerebrovascular disease, unspecified: Secondary | ICD-10-CM | POA: Diagnosis not present

## 2023-06-24 DIAGNOSIS — K2100 Gastro-esophageal reflux disease with esophagitis, without bleeding: Secondary | ICD-10-CM | POA: Diagnosis not present

## 2023-06-24 DIAGNOSIS — I10 Essential (primary) hypertension: Secondary | ICD-10-CM | POA: Diagnosis not present

## 2023-06-24 DIAGNOSIS — E7849 Other hyperlipidemia: Secondary | ICD-10-CM | POA: Diagnosis not present

## 2023-06-24 DIAGNOSIS — I679 Cerebrovascular disease, unspecified: Secondary | ICD-10-CM | POA: Diagnosis not present

## 2023-06-24 DIAGNOSIS — R634 Abnormal weight loss: Secondary | ICD-10-CM | POA: Diagnosis not present

## 2023-06-24 DIAGNOSIS — R296 Repeated falls: Secondary | ICD-10-CM | POA: Diagnosis not present

## 2023-07-01 DIAGNOSIS — R634 Abnormal weight loss: Secondary | ICD-10-CM | POA: Diagnosis not present

## 2023-07-01 DIAGNOSIS — R296 Repeated falls: Secondary | ICD-10-CM | POA: Diagnosis not present

## 2023-07-01 DIAGNOSIS — K2100 Gastro-esophageal reflux disease with esophagitis, without bleeding: Secondary | ICD-10-CM | POA: Diagnosis not present

## 2023-07-01 DIAGNOSIS — I10 Essential (primary) hypertension: Secondary | ICD-10-CM | POA: Diagnosis not present

## 2023-07-01 DIAGNOSIS — E7849 Other hyperlipidemia: Secondary | ICD-10-CM | POA: Diagnosis not present

## 2023-07-01 DIAGNOSIS — I679 Cerebrovascular disease, unspecified: Secondary | ICD-10-CM | POA: Diagnosis not present

## 2023-07-02 DIAGNOSIS — E7849 Other hyperlipidemia: Secondary | ICD-10-CM | POA: Diagnosis not present

## 2023-07-02 DIAGNOSIS — I10 Essential (primary) hypertension: Secondary | ICD-10-CM | POA: Diagnosis not present

## 2023-07-02 DIAGNOSIS — I679 Cerebrovascular disease, unspecified: Secondary | ICD-10-CM | POA: Diagnosis not present

## 2023-07-02 DIAGNOSIS — R296 Repeated falls: Secondary | ICD-10-CM | POA: Diagnosis not present

## 2023-07-02 DIAGNOSIS — R634 Abnormal weight loss: Secondary | ICD-10-CM | POA: Diagnosis not present

## 2023-07-02 DIAGNOSIS — K2100 Gastro-esophageal reflux disease with esophagitis, without bleeding: Secondary | ICD-10-CM | POA: Diagnosis not present

## 2023-07-06 DIAGNOSIS — I10 Essential (primary) hypertension: Secondary | ICD-10-CM | POA: Diagnosis not present

## 2023-07-06 DIAGNOSIS — K2100 Gastro-esophageal reflux disease with esophagitis, without bleeding: Secondary | ICD-10-CM | POA: Diagnosis not present

## 2023-07-06 DIAGNOSIS — E7849 Other hyperlipidemia: Secondary | ICD-10-CM | POA: Diagnosis not present

## 2023-07-06 DIAGNOSIS — I679 Cerebrovascular disease, unspecified: Secondary | ICD-10-CM | POA: Diagnosis not present

## 2023-07-06 DIAGNOSIS — R296 Repeated falls: Secondary | ICD-10-CM | POA: Diagnosis not present

## 2023-07-06 DIAGNOSIS — R634 Abnormal weight loss: Secondary | ICD-10-CM | POA: Diagnosis not present

## 2023-07-08 DIAGNOSIS — I10 Essential (primary) hypertension: Secondary | ICD-10-CM | POA: Diagnosis not present

## 2023-07-08 DIAGNOSIS — R634 Abnormal weight loss: Secondary | ICD-10-CM | POA: Diagnosis not present

## 2023-07-08 DIAGNOSIS — I679 Cerebrovascular disease, unspecified: Secondary | ICD-10-CM | POA: Diagnosis not present

## 2023-07-08 DIAGNOSIS — E7849 Other hyperlipidemia: Secondary | ICD-10-CM | POA: Diagnosis not present

## 2023-07-08 DIAGNOSIS — K2100 Gastro-esophageal reflux disease with esophagitis, without bleeding: Secondary | ICD-10-CM | POA: Diagnosis not present

## 2023-07-08 DIAGNOSIS — R296 Repeated falls: Secondary | ICD-10-CM | POA: Diagnosis not present

## 2023-07-09 DIAGNOSIS — E7849 Other hyperlipidemia: Secondary | ICD-10-CM | POA: Diagnosis not present

## 2023-07-09 DIAGNOSIS — R634 Abnormal weight loss: Secondary | ICD-10-CM | POA: Diagnosis not present

## 2023-07-09 DIAGNOSIS — I10 Essential (primary) hypertension: Secondary | ICD-10-CM | POA: Diagnosis not present

## 2023-07-09 DIAGNOSIS — K2100 Gastro-esophageal reflux disease with esophagitis, without bleeding: Secondary | ICD-10-CM | POA: Diagnosis not present

## 2023-07-09 DIAGNOSIS — R296 Repeated falls: Secondary | ICD-10-CM | POA: Diagnosis not present

## 2023-07-09 DIAGNOSIS — I679 Cerebrovascular disease, unspecified: Secondary | ICD-10-CM | POA: Diagnosis not present

## 2023-07-12 DIAGNOSIS — K2100 Gastro-esophageal reflux disease with esophagitis, without bleeding: Secondary | ICD-10-CM | POA: Diagnosis not present

## 2023-07-12 DIAGNOSIS — R296 Repeated falls: Secondary | ICD-10-CM | POA: Diagnosis not present

## 2023-07-12 DIAGNOSIS — E7849 Other hyperlipidemia: Secondary | ICD-10-CM | POA: Diagnosis not present

## 2023-07-12 DIAGNOSIS — I679 Cerebrovascular disease, unspecified: Secondary | ICD-10-CM | POA: Diagnosis not present

## 2023-07-12 DIAGNOSIS — R634 Abnormal weight loss: Secondary | ICD-10-CM | POA: Diagnosis not present

## 2023-07-12 DIAGNOSIS — I10 Essential (primary) hypertension: Secondary | ICD-10-CM | POA: Diagnosis not present

## 2023-07-13 ENCOUNTER — Non-Acute Institutional Stay (INDEPENDENT_AMBULATORY_CARE_PROVIDER_SITE_OTHER): Payer: Medicare Other | Admitting: Nurse Practitioner

## 2023-07-13 ENCOUNTER — Encounter: Payer: Self-pay | Admitting: Nurse Practitioner

## 2023-07-13 DIAGNOSIS — E7849 Other hyperlipidemia: Secondary | ICD-10-CM | POA: Diagnosis not present

## 2023-07-13 DIAGNOSIS — K2100 Gastro-esophageal reflux disease with esophagitis, without bleeding: Secondary | ICD-10-CM | POA: Diagnosis not present

## 2023-07-13 DIAGNOSIS — I679 Cerebrovascular disease, unspecified: Secondary | ICD-10-CM | POA: Diagnosis not present

## 2023-07-13 DIAGNOSIS — Z0000 Encounter for general adult medical examination without abnormal findings: Secondary | ICD-10-CM | POA: Diagnosis not present

## 2023-07-13 DIAGNOSIS — Z66 Do not resuscitate: Secondary | ICD-10-CM | POA: Diagnosis not present

## 2023-07-13 DIAGNOSIS — R634 Abnormal weight loss: Secondary | ICD-10-CM | POA: Diagnosis not present

## 2023-07-13 DIAGNOSIS — R296 Repeated falls: Secondary | ICD-10-CM | POA: Diagnosis not present

## 2023-07-13 DIAGNOSIS — I10 Essential (primary) hypertension: Secondary | ICD-10-CM | POA: Diagnosis not present

## 2023-07-13 NOTE — Progress Notes (Signed)
Subjective:   Tara Bennett is a 89 y.o. female who presents for Medicare Annual (Subsequent) preventive examination.  Visit Complete: In person  Patient Medicare AWV questionnaire was completed by the patient on 07/13/23; I have confirmed that all information answered by patient is correct and no changes since this date.        Objective:    Today's Vitals   07/13/23 0823  BP: 118/64  Pulse: 70  Resp: 18  Temp: 97.8 F (36.6 C)  SpO2: 98%  Weight: 114 lb 12.8 oz (52.1 kg)  Height: 5\' 2"  (1.575 m)   Body mass index is 21 kg/m.     07/13/2023    8:30 AM 05/18/2023   11:54 AM 04/29/2023    3:18 PM 03/29/2023   11:10 AM 01/06/2023   10:30 AM 09/09/2022    4:06 PM 12/24/2020   10:28 AM  Advanced Directives  Does Patient Have a Medical Advance Directive? No;Yes No No No No No No  Type of Advance Directive Out of facility DNR (pink MOST or yellow form)        Does patient want to make changes to medical advance directive? No - Patient declined        Would patient like information on creating a medical advance directive? No - Patient declined No - Patient declined No - Patient declined No - Patient declined No - Patient declined No - Patient declined   Pre-existing out of facility DNR order (yellow form or pink MOST form) Yellow form placed in chart (order not valid for inpatient use)          Current Medications (verified) Outpatient Encounter Medications as of 07/13/2023  Medication Sig   acetaminophen (TYLENOL) 325 MG tablet Take 325 mg by mouth every 6 (six) hours as needed for headache.   aspirin 81 MG tablet Take 81 mg by mouth at bedtime.   clopidogrel (PLAVIX) 75 MG tablet Take 75 mg by mouth daily.   diclofenac sodium (VOLTAREN) 1 % GEL Apply topically as needed.   pantoprazole (PROTONIX) 40 MG tablet Take 40 mg by mouth daily.   Calcium Citrate-Vitamin D (CALCIUM CITRATE +) 315-5 MG-MCG TABS Take 1 tablet by mouth daily. (Patient not taking: Reported on  07/13/2023)   Cholecalciferol 50 MCG (2000 UT) TABS Take 1 tablet by mouth daily. (Patient not taking: Reported on 07/13/2023)   Multiple Vitamin (MULTIVITAMIN WITH MINERALS) TABS tablet Take 1 tablet by mouth daily. (Patient not taking: Reported on 07/13/2023)   olmesartan-hydrochlorothiazide (BENICAR HCT) 20-12.5 MG tablet Take 1 tablet by mouth daily. (Patient not taking: Reported on 07/13/2023)   simvastatin (ZOCOR) 20 MG tablet Take 20 mg by mouth at bedtime.   (Patient not taking: Reported on 07/13/2023)   No facility-administered encounter medications on file as of 07/13/2023.    Allergies (verified) Furosemide   History: Past Medical History:  Diagnosis Date   Arthritis    Edema    bilateral ankles   History of CVA (cerebrovascular accident) 02/11/2023   02/11/23 MOSE DNR, limited interventions, ABT/IVF if indicated, no feeding tube.      History of urethral stricture    freq/ urge   HOH (hard of hearing)    both ears   Hypertension    elevates legs    Urethral stricture    Urinary retention    Wears hearing aid    Past Surgical History:  Procedure Laterality Date   CYSTO/ URETHRAL DILATION/ FULGERATION OF BLEEDER  10-16-10  CYSTOSCOPY WITH URETHRAL DILATATION  07/02/2011   Procedure: CYSTOSCOPY WITH URETHRAL DILATATION;  Surgeon: Anner Crete;  Location: [facility];  Service: Urology;  Laterality: N/A;   CYSTOSCOPY WITH URETHRAL DILATATION N/A 08/24/2013   Procedure: CYSTOSCOPY WITH URETHRAL DILATATION;  Surgeon: Bjorn Pippin, MD;  Location: [facility];  Service: Urology;  Laterality: N/A;   CYSTOSCOPY WITH URETHRAL DILATATION N/A 05/02/2015   Procedure: CYSTOSCOPY WITH URETHRAL DILATATION, FULGURATION BLADDER NECK;  Surgeon: Bjorn Pippin, MD;  Location: [facility];  Service: Urology;  Laterality: N/A;   CYSTOSCOPY WITH URETHRAL DILATATION N/A 08/03/2017   Procedure: CYSTOSCOPY WITH URETHRAL DILATATION;  Surgeon: Bjorn Pippin,  MD;  Location: [facility];  Service: Urology;  Laterality: N/A;   ORIF ORBITAL FRACTURE Right 07/29/2015   Procedure: RIGHT ORBITAL FLOOR REPAIR ;  Surgeon: Christia Reading, MD;  Location: [facility];  Service: ENT;  Laterality: Right;   History reviewed. No pertinent family history. Social History   Socioeconomic History   Marital status: Single    Spouse name: Not on file   Number of children: Not on file   Years of education: Not on file   Highest education level: Not on file  Occupational History   Not on file  Tobacco Use   Smoking status: Former    Current packs/day: 0.00    Types: Cigarettes    Start date: 07/01/1962    Quit date: 07/01/1964    Years since quitting: 59.0   Smokeless tobacco: Never  Vaping Use   Vaping status: Never Used  Substance and Sexual Activity   Alcohol use: No   Drug use: No   Sexual activity: Not on file  Other Topics Concern   Not on file  Social History Narrative   Not on file   Social Determinants of Health   Financial Resource Strain: Not on file  Food Insecurity: Not on file  Transportation Needs: Not on file  Physical Activity: Not on file  Stress: Not on file  Social Connections: Not on file    Tobacco Counseling Counseling given: Not Answered   Clinical Intake:  Pre-visit preparation completed: Yes  Pain : No/denies pain     BMI - recorded: 21 Nutritional Status: BMI of 19-24  Normal Nutritional Risks: Unintentional weight loss Diabetes: No  How often do you need to have someone help you when you read instructions, pamphlets, or other written materials from your doctor or pharmacy?: 4 - Often What is the last grade level you completed in school?: college     Information entered by :: Arsalan Brisbin Nedra Hai NP   Activities of Daily Living    07/13/2023    2:42 PM  In your present state of health, do you have any difficulty performing the following activities:  Hearing? 1  Comment  hearing aids  Vision? 0  Difficulty concentrating or making decisions? 1  Walking or climbing stairs? 1  Dressing or bathing? 1  Doing errands, shopping? 1  Preparing Food and eating ? N  Using the Toilet? N  In the past six months, have you accidently leaked urine? Y  Do you have problems with loss of bowel control? N  Managing your Medications? Y  Managing your Finances? Y  Housekeeping or managing your Housekeeping? Y    Patient Care Team: Chilton Greathouse, MD as PCP - General (Internal Medicine)  Indicate any recent Medical Services you may have received from other than [facility] providers in  the past year (date may be approximate).     Assessment:   This is a routine wellness examination for Tara Bennett.  Hearing/Vision screen No results found.   Goals Addressed   None    Depression Screen     No data to display          Fall Risk     No data to display          MEDICARE RISK AT HOME: Medicare Risk at Home Any stairs in or around the home?: Yes If so, are there any without handrails?: No Home free of loose throw rugs in walkways, pet beds, electrical cords, etc?: No Adequate lighting in your home to reduce risk of falls?: Yes Life alert?: No Use of a cane, walker or w/c?: Yes Grab bars in the bathroom?: Yes Shower chair or bench in shower?: Yes Elevated toilet seat or a handicapped toilet?: Yes  TIMED UP AND GO:  Was the test performed?  Yes  Length of time to ambulate 10 feet: 16 sec Gait slow and steady with assistive device    Cognitive Function:        Immunizations Immunization History  Administered Date(s) Administered   DTaP 02/20/2008   Influenza, High Dose Seasonal PF 05/19/2023   Influenza-Unspecified 05/17/2010   Pneumococcal-Unspecified 12/15/2005   Tdap 07/15/2015    TDAP status: Up to date  Flu Vaccine status: Up to date  Pneumococcal vaccine status: Due, Education has been provided regarding the importance of this vaccine.  Advised may receive this vaccine at local pharmacy or Health Dept. Aware to provide a copy of the vaccination record if obtained from local pharmacy or Health Dept. Verbalized acceptance and understanding.  Covid-19 vaccine status: Information provided on how to obtain vaccines.   Qualifies for Shingles Vaccine? Yes   Zostavax completed Yes   Shingrix Completed?: No.    Education has been provided regarding the importance of this vaccine. Patient has been advised to call insurance company to determine out of pocket expense if they have not yet received this vaccine. Advised may also receive vaccine at local pharmacy or Health Dept. Verbalized acceptance and understanding.  Screening Tests Health Maintenance  Topic Date Due   Zoster Vaccines- Shingrix (1 of 2) Never done   Pneumonia Vaccine 16+ Years old (1 of 1 - PCV) 01/20/1995   DEXA SCAN  Never done   COVID-19 Vaccine (1 - 2023-24 season) Never done   DTaP/Tdap/Td (3 - Td or Tdap) 07/14/2025   INFLUENZA VACCINE  Completed   HPV VACCINES  Aged Out    Health Maintenance  Health Maintenance Due  Topic Date Due   Zoster Vaccines- Shingrix (1 of 2) Never done   Pneumonia Vaccine 68+ Years old (1 of 1 - PCV) 01/20/1995   DEXA SCAN  Never done   COVID-19 Vaccine (1 - 2023-24 season) Never done    Colorectal cancer screening: No longer required.   Mammogram status: No longer required due to aged out.  Bone Density status: Ordered 07/13/23. Pt provided with contact info and advised to call to schedule appt.  Lung Cancer Screening: (Low Dose CT Chest recommended if Age 40-80 years, 20 pack-year currently smoking OR have quit w/in 15years.) does not qualify.   Lung Cancer Screening Referral: NA  Additional Screening:  Hepatitis C Screening: does not qualify; Completed   Vision Screening: Recommended annual ophthalmology exams for early detection of glaucoma and other disorders of the eye. Is the patient up to date  with their  annual eye exam?  No  Who is the provider or what is the name of the office in which the patient attends annual eye exams? [facility] will provide If pt is not established with a provider, would they like to be referred to a provider to establish care? No .   Dental Screening: Recommended annual dental exams for proper oral hygiene  Diabetic Foot Exam: NA  Community Resource Referral / Chronic Care Management: CRR required this visit?  No   CCM required this visit?  No     Plan:     I have personally reviewed and noted the following in the patient's chart:   Medical and social history Use of alcohol, tobacco or illicit drugs  Current medications and supplements including opioid prescriptions. Patient is not currently taking opioid prescriptions. Functional ability and status Nutritional status Physical activity Advanced directives List of other physicians Hospitalizations, surgeries, and ER visits in previous 12 months Vitals Screenings to include cognitive, depression, and falls Referrals and appointments  In addition, I have reviewed and discussed with patient certain preventive protocols, quality metrics, and best practice recommendations. A written personalized care plan for preventive services as well as general preventive health recommendations were provided to patient.     Catina Nuss X Marye Eagen, NP   07/13/2023   After Visit Summary: (In Person-Declined) Patient declined AVS at this time.

## 2023-07-18 DIAGNOSIS — R296 Repeated falls: Secondary | ICD-10-CM | POA: Diagnosis not present

## 2023-07-18 DIAGNOSIS — I679 Cerebrovascular disease, unspecified: Secondary | ICD-10-CM | POA: Diagnosis not present

## 2023-07-18 DIAGNOSIS — R634 Abnormal weight loss: Secondary | ICD-10-CM | POA: Diagnosis not present

## 2023-07-18 DIAGNOSIS — I10 Essential (primary) hypertension: Secondary | ICD-10-CM | POA: Diagnosis not present

## 2023-07-18 DIAGNOSIS — E7849 Other hyperlipidemia: Secondary | ICD-10-CM | POA: Diagnosis not present

## 2023-07-18 DIAGNOSIS — K2100 Gastro-esophageal reflux disease with esophagitis, without bleeding: Secondary | ICD-10-CM | POA: Diagnosis not present

## 2023-07-19 ENCOUNTER — Non-Acute Institutional Stay: Payer: Medicare Other | Admitting: Nurse Practitioner

## 2023-07-19 ENCOUNTER — Encounter: Payer: Self-pay | Admitting: Nurse Practitioner

## 2023-07-19 DIAGNOSIS — I10 Essential (primary) hypertension: Secondary | ICD-10-CM | POA: Diagnosis not present

## 2023-07-19 DIAGNOSIS — Z8673 Personal history of transient ischemic attack (TIA), and cerebral infarction without residual deficits: Secondary | ICD-10-CM | POA: Diagnosis not present

## 2023-07-19 DIAGNOSIS — G3184 Mild cognitive impairment, so stated: Secondary | ICD-10-CM | POA: Diagnosis not present

## 2023-07-19 DIAGNOSIS — K219 Gastro-esophageal reflux disease without esophagitis: Secondary | ICD-10-CM | POA: Diagnosis not present

## 2023-07-19 DIAGNOSIS — I679 Cerebrovascular disease, unspecified: Secondary | ICD-10-CM | POA: Diagnosis not present

## 2023-07-19 DIAGNOSIS — E7849 Other hyperlipidemia: Secondary | ICD-10-CM | POA: Diagnosis not present

## 2023-07-19 DIAGNOSIS — R296 Repeated falls: Secondary | ICD-10-CM | POA: Insufficient documentation

## 2023-07-19 DIAGNOSIS — R634 Abnormal weight loss: Secondary | ICD-10-CM | POA: Diagnosis not present

## 2023-07-19 DIAGNOSIS — K2100 Gastro-esophageal reflux disease with esophagitis, without bleeding: Secondary | ICD-10-CM | POA: Diagnosis not present

## 2023-07-19 NOTE — Assessment & Plan Note (Signed)
stable, on  Pantoprazole. Hgb 10.0 05/18/23

## 2023-07-19 NOTE — Assessment & Plan Note (Signed)
Blood pressure is controlled, off Meds.

## 2023-07-19 NOTE — Progress Notes (Signed)
Location:   [facility] Room Number: 918 Place of Service:  SNF (31) Provider: Arna Snipe Odessa Nishi NP  Chilton Greathouse, MD  Patient Care Team: Chilton Greathouse, MD as PCP - General (Internal Medicine)  Extended Emergency Contact Information Primary Emergency Contact: Jaquelyn Bitter of [place] Home Phone: [phone] Mobile Phone: [phone] Relation: Nephew Secondary Emergency Contact: Ferne Coe [place] of [place] Home Phone: [phone] Relation: Other  Code Status: DNR Goals of care: Advanced Directive information    07/13/2023    8:30 AM  Advanced Directives  Does Patient Have a Medical Advance Directive? No;Yes  Type of Advance Directive Out of facility DNR (pink MOST or yellow form)  Does patient want to make changes to medical advance directive? No - Patient declined  Would patient like information on creating a medical advance directive? No - Patient declined  Pre-existing out of facility DNR order (yellow form or pink MOST form) Yellow form placed in chart (order not valid for inpatient use)     Chief Complaint  Patient presents with   Acute Visit    Reported increased confusion    HPI:  Pt is a 89 y.o. female seen today for an acute visit for 07/17/23 reported the patient's more confused, 07/14/23 MMSE 29/30. Fell 07/16/23, 07/17/23 in bathroom, resulted bruises left forearm, R lower leg, denied pain.    Dysphagia, working with ST, declined diet modification             Weight loss/Adult failure to thrive, ongoing issue             HLD on statin             HTN, runs low, off meds.              Hx of CVA/Dysarthria, severe, on Plavix, ASA, Statin             GERD, stable, on  Pantoprazole. Hgb 10.0 05/18/23             Cognitive impairment, MMSE 29/30 07/14/23             CKD Bun/creat 44/1.63 05/27/23  Past Medical History:  Diagnosis Date   Arthritis    Edema    bilateral ankles   History of CVA (cerebrovascular accident)  02/11/2023   02/11/23 MOSE DNR, limited interventions, ABT/IVF if indicated, no feeding tube.      History of urethral stricture    freq/ urge   HOH (hard of hearing)    both ears   Hypertension    elevates legs    Urethral stricture    Urinary retention    Wears hearing aid    Past Surgical History:  Procedure Laterality Date   CYSTO/ URETHRAL DILATION/ FULGERATION OF BLEEDER  10-16-10   CYSTOSCOPY WITH URETHRAL DILATATION  07/02/2011   Procedure: CYSTOSCOPY WITH URETHRAL DILATATION;  Surgeon: Anner Crete;  Location: [facility];  Service: Urology;  Laterality: N/A;   CYSTOSCOPY WITH URETHRAL DILATATION N/A 08/24/2013   Procedure: CYSTOSCOPY WITH URETHRAL DILATATION;  Surgeon: Bjorn Pippin, MD;  Location: [facility];  Service: Urology;  Laterality: N/A;   CYSTOSCOPY WITH URETHRAL DILATATION N/A 05/02/2015   Procedure: CYSTOSCOPY WITH URETHRAL DILATATION, FULGURATION BLADDER NECK;  Surgeon: Bjorn Pippin, MD;  Location: [facility];  Service: Urology;  Laterality: N/A;   CYSTOSCOPY WITH URETHRAL DILATATION N/A 08/03/2017   Procedure: CYSTOSCOPY WITH URETHRAL DILATATION;  Surgeon: Bjorn Pippin, MD;  Location: [facility];  Service: Urology;  Laterality: N/A;   ORIF ORBITAL FRACTURE Right 07/29/2015   Procedure: RIGHT ORBITAL FLOOR REPAIR ;  Surgeon: Christia Reading, MD;  Location: [facility];  Service: ENT;  Laterality: Right;    Allergies  Allergen Reactions   Furosemide Other (See Comments)    Excessive Diarrhea.    Allergies as of 07/19/2023       Reactions   Furosemide Other (See Comments)   Excessive Diarrhea.        Medication List        Accurate as of July 19, 2023  2:13 PM. If you have any questions, ask your nurse or doctor.          acetaminophen 325 MG tablet Commonly known as: TYLENOL Take 325 mg by mouth every 6 (six) hours as needed for headache.   aspirin 81 MG tablet Take 81 mg  by mouth at bedtime.   Calcium Citrate + 315-5 MG-MCG Tabs Generic drug: Calcium Citrate-Vitamin D Take 1 tablet by mouth daily.   Cholecalciferol 50 MCG (2000 UT) Tabs Take 1 tablet by mouth daily.   clopidogrel 75 MG tablet Commonly known as: PLAVIX Take 75 mg by mouth daily.   diclofenac sodium 1 % Gel Commonly known as: VOLTAREN Apply topically as needed.   multivitamin with minerals Tabs tablet Take 1 tablet by mouth daily.   olmesartan-hydrochlorothiazide 20-12.5 MG tablet Commonly known as: BENICAR HCT Take 1 tablet by mouth daily.   pantoprazole 40 MG tablet Commonly known as: PROTONIX Take 40 mg by mouth daily.   simvastatin 20 MG tablet Commonly known as: ZOCOR Take 20 mg by mouth at bedtime.        Review of Systems  Constitutional:  Negative for appetite change, fatigue and fever.  HENT:  Positive for trouble swallowing. Negative for congestion.   Respiratory:  Negative for cough, shortness of breath and wheezing.   Cardiovascular:  Negative for chest pain, palpitations and leg swelling.  Gastrointestinal:  Negative for abdominal pain.  Genitourinary:  Negative for dysuria and urgency.  Musculoskeletal:  Positive for arthralgias and gait problem.  Skin:  Negative for color change.  Neurological:  Positive for speech difficulty. Negative for syncope and headaches.  Psychiatric/Behavioral:  Negative for behavioral problems. The patient is not nervous/anxious.     Immunization History  Administered Date(s) Administered   DTaP 02/20/2008   Influenza, High Dose Seasonal PF 05/19/2023   Influenza-Unspecified 05/17/2010   Pneumococcal-Unspecified 12/15/2005   Tdap 07/15/2015   Pertinent  Health Maintenance Due  Topic Date Due   DEXA SCAN  Never done   INFLUENZA VACCINE  Completed      12/24/2020   10:29 AM  Fall Risk  (RETIRED) Patient Fall Risk Level High fall risk   Functional Status Survey:    Vitals:   07/19/23 1401  BP: 125/65  Pulse:  68  Resp: 18  Temp: 98 F (36.7 C)  SpO2: 97%   There is no height or weight on file to calculate BMI. Physical Exam Vitals reviewed.  Constitutional:      Appearance: Normal appearance.     Comments: Continue weight loss  HENT:     Head: Normocephalic and atraumatic.     Nose: Nose normal.     Mouth/Throat:     Mouth: Mucous membranes are moist.  Eyes:     Extraocular Movements: Extraocular movements intact.     Conjunctiva/sclera: Conjunctivae normal.     Pupils: Pupils  are equal, round, and reactive to light.  Cardiovascular:     Rate and Rhythm: Normal rate and regular rhythm.     Heart sounds: No murmur heard. Pulmonary:     Effort: Pulmonary effort is normal.     Breath sounds: No rales.  Abdominal:     General: Bowel sounds are normal.     Palpations: Abdomen is soft.     Tenderness: There is no abdominal tenderness.  Musculoskeletal:     Cervical back: Normal range of motion and neck supple.     Right lower leg: No edema.     Left lower leg: No edema.  Skin:    General: Skin is warm and dry.     Findings: Bruising present.     Comments: Left forearm, R shin ecchymoses, no pain, swelling, redness, or warmth.   Neurological:     General: No focal deficit present.     Mental Status: She is alert. Mental status is at baseline.     Gait: Gait abnormal.     Comments: Oriented to person, uses power w/c to get around.   Psychiatric:        Mood and Affect: Mood normal.        Behavior: Behavior normal.     Labs reviewed: Recent Labs    03/30/23 0000 04/30/23 0000 06/11/23 0000  NA 134* 136* 137  K 4.1 3.5 4.6  CL 97* 102 102  CO2 28* 23* 29*  BUN 37* 70* 35*  CREATININE 1.5* 4.0* 1.4*  CALCIUM 9.5 8.8 9.0   Recent Labs    03/30/23 0000 04/30/23 0000  AST 14 11*  ALT 13 6*  ALKPHOS 58 51  ALBUMIN 4.0 3.2*   Recent Labs    03/30/23 0000 04/30/23 0000  WBC 6.2 5.7  NEUTROABS 4,644.00 3,819.00  HGB 11.2* 9.6*  HCT 34* 29*  PLT 322 261    Lab Results  Component Value Date   TSH 1.67 04/30/2023   No results found for: "HGBA1C" Lab Results  Component Value Date   CHOL 141 03/30/2023   HDL 49 03/30/2023   LDLCALC 75 03/30/2023   TRIG 85 03/30/2023    Significant Diagnostic Results in last 30 days:  No results found.  Assessment/Plan: MCI (mild cognitive impairment)  07/17/23 reported the patient's more confused, 07/14/23 MMSE 29/30. Fell 07/16/23, 07/17/23 in bathroom, resulted bruises left forearm, R lower leg, denied pain.  Update CBC/diff, CMP/eGFR  Hypertension Blood pressure is controlled, off Meds.   History of CVA (cerebrovascular accident) Hx of CVA/Dysarthria, severe, on Plavix, ASA, Statin  GERD (gastroesophageal reflux disease) stable, on  Pantoprazole. Hgb 10.0 05/18/23  Frequent falls Close supervision and assistance for safety, therapy to eval and tx.     Family/ staff Communication: plan of care reviewed with the patient and charge nurse.   Labs/tests ordered:  CBC/diff, CMP/eGFR  Time spend 40 minutes.

## 2023-07-19 NOTE — Assessment & Plan Note (Signed)
07/17/23 reported the patient's more confused, 07/14/23 MMSE 29/30. Fell 07/16/23, 07/17/23 in bathroom, resulted bruises left forearm, R lower leg, denied pain.  Update CBC/diff, CMP/eGFR

## 2023-07-19 NOTE — Assessment & Plan Note (Signed)
Close supervision and assistance for safety, therapy to eval and tx.

## 2023-07-19 NOTE — Assessment & Plan Note (Signed)
Hx of CVA/Dysarthria, severe, on Plavix, ASA, Statin

## 2023-07-20 DIAGNOSIS — I10 Essential (primary) hypertension: Secondary | ICD-10-CM | POA: Diagnosis not present

## 2023-07-20 DIAGNOSIS — R634 Abnormal weight loss: Secondary | ICD-10-CM | POA: Diagnosis not present

## 2023-07-26 DIAGNOSIS — I10 Essential (primary) hypertension: Secondary | ICD-10-CM | POA: Diagnosis not present

## 2023-07-26 DIAGNOSIS — R634 Abnormal weight loss: Secondary | ICD-10-CM | POA: Diagnosis not present

## 2023-07-26 DIAGNOSIS — R296 Repeated falls: Secondary | ICD-10-CM | POA: Diagnosis not present

## 2023-07-26 DIAGNOSIS — I679 Cerebrovascular disease, unspecified: Secondary | ICD-10-CM | POA: Diagnosis not present

## 2023-07-26 DIAGNOSIS — K2100 Gastro-esophageal reflux disease with esophagitis, without bleeding: Secondary | ICD-10-CM | POA: Diagnosis not present

## 2023-07-26 DIAGNOSIS — E7849 Other hyperlipidemia: Secondary | ICD-10-CM | POA: Diagnosis not present

## 2023-07-30 DIAGNOSIS — K2100 Gastro-esophageal reflux disease with esophagitis, without bleeding: Secondary | ICD-10-CM | POA: Diagnosis not present

## 2023-07-30 DIAGNOSIS — I10 Essential (primary) hypertension: Secondary | ICD-10-CM | POA: Diagnosis not present

## 2023-07-30 DIAGNOSIS — E7849 Other hyperlipidemia: Secondary | ICD-10-CM | POA: Diagnosis not present

## 2023-07-30 DIAGNOSIS — R296 Repeated falls: Secondary | ICD-10-CM | POA: Diagnosis not present

## 2023-07-30 DIAGNOSIS — I679 Cerebrovascular disease, unspecified: Secondary | ICD-10-CM | POA: Diagnosis not present

## 2023-07-30 DIAGNOSIS — R634 Abnormal weight loss: Secondary | ICD-10-CM | POA: Diagnosis not present

## 2023-08-02 DIAGNOSIS — I10 Essential (primary) hypertension: Secondary | ICD-10-CM | POA: Diagnosis not present

## 2023-08-02 DIAGNOSIS — E7849 Other hyperlipidemia: Secondary | ICD-10-CM | POA: Diagnosis not present

## 2023-08-02 DIAGNOSIS — I679 Cerebrovascular disease, unspecified: Secondary | ICD-10-CM | POA: Diagnosis not present

## 2023-08-02 DIAGNOSIS — K2100 Gastro-esophageal reflux disease with esophagitis, without bleeding: Secondary | ICD-10-CM | POA: Diagnosis not present

## 2023-08-02 DIAGNOSIS — R296 Repeated falls: Secondary | ICD-10-CM | POA: Diagnosis not present

## 2023-08-02 DIAGNOSIS — R634 Abnormal weight loss: Secondary | ICD-10-CM | POA: Diagnosis not present

## 2023-08-06 DIAGNOSIS — K2100 Gastro-esophageal reflux disease with esophagitis, without bleeding: Secondary | ICD-10-CM | POA: Diagnosis not present

## 2023-08-06 DIAGNOSIS — E7849 Other hyperlipidemia: Secondary | ICD-10-CM | POA: Diagnosis not present

## 2023-08-06 DIAGNOSIS — I679 Cerebrovascular disease, unspecified: Secondary | ICD-10-CM | POA: Diagnosis not present

## 2023-08-06 DIAGNOSIS — R296 Repeated falls: Secondary | ICD-10-CM | POA: Diagnosis not present

## 2023-08-06 DIAGNOSIS — R634 Abnormal weight loss: Secondary | ICD-10-CM | POA: Diagnosis not present

## 2023-08-06 DIAGNOSIS — I10 Essential (primary) hypertension: Secondary | ICD-10-CM | POA: Diagnosis not present

## 2023-08-10 DIAGNOSIS — R296 Repeated falls: Secondary | ICD-10-CM | POA: Diagnosis not present

## 2023-08-10 DIAGNOSIS — I679 Cerebrovascular disease, unspecified: Secondary | ICD-10-CM | POA: Diagnosis not present

## 2023-08-10 DIAGNOSIS — I10 Essential (primary) hypertension: Secondary | ICD-10-CM | POA: Diagnosis not present

## 2023-08-10 DIAGNOSIS — K2100 Gastro-esophageal reflux disease with esophagitis, without bleeding: Secondary | ICD-10-CM | POA: Diagnosis not present

## 2023-08-10 DIAGNOSIS — E7849 Other hyperlipidemia: Secondary | ICD-10-CM | POA: Diagnosis not present

## 2023-08-10 DIAGNOSIS — R634 Abnormal weight loss: Secondary | ICD-10-CM | POA: Diagnosis not present

## 2023-08-13 ENCOUNTER — Encounter: Payer: Self-pay | Admitting: Sports Medicine

## 2023-08-13 ENCOUNTER — Non-Acute Institutional Stay (SKILLED_NURSING_FACILITY): Payer: Self-pay | Admitting: Sports Medicine

## 2023-08-13 DIAGNOSIS — R634 Abnormal weight loss: Secondary | ICD-10-CM | POA: Diagnosis not present

## 2023-08-13 DIAGNOSIS — Z515 Encounter for palliative care: Secondary | ICD-10-CM | POA: Diagnosis not present

## 2023-08-13 DIAGNOSIS — K2100 Gastro-esophageal reflux disease with esophagitis, without bleeding: Secondary | ICD-10-CM | POA: Diagnosis not present

## 2023-08-13 DIAGNOSIS — I10 Essential (primary) hypertension: Secondary | ICD-10-CM | POA: Diagnosis not present

## 2023-08-13 DIAGNOSIS — E7849 Other hyperlipidemia: Secondary | ICD-10-CM | POA: Diagnosis not present

## 2023-08-13 DIAGNOSIS — R1033 Periumbilical pain: Secondary | ICD-10-CM

## 2023-08-13 DIAGNOSIS — R296 Repeated falls: Secondary | ICD-10-CM | POA: Diagnosis not present

## 2023-08-13 DIAGNOSIS — I679 Cerebrovascular disease, unspecified: Secondary | ICD-10-CM | POA: Diagnosis not present

## 2023-08-13 NOTE — Progress Notes (Signed)
Location:  [facility] Room Number: [phone] Place of Service:  ALF [phone]) Provider:  Venita Sheffield, MD    Patient Care Team: Chilton Greathouse, MD as PCP - General (Internal Medicine)  Extended Emergency Contact Information Primary Emergency Contact: Jaquelyn Bitter of [place] Home Phone: [phone] Mobile Phone: [phone] Relation: Nephew Secondary Emergency Contact: Ferne Coe [place] of [place] Home Phone: [phone] Relation: Other  Code Status:  DNR Goals of care: Advanced Directive information    08/13/2023   11:30 AM  Advanced Directives  Does Patient Have a Medical Advance Directive? No;Yes  Type of Advance Directive Out of facility DNR (pink MOST or yellow form)  Does patient want to make changes to medical advance directive? No - Patient declined  Would patient like information on creating a medical advance directive? No - Patient declined  Pre-existing out of facility DNR order (yellow form or pink MOST form) Yellow form placed in chart (order not valid for inpatient use)     Chief Complaint  Patient presents with   Acute Visit    Stomach pain                                      NON HOSPICE RELATED VISIT HPI:  Pt is a 89 y.o. female seen today for an acute visit for nursing report for belly pain.  Pt seen and examined in her room.  She is sitting in recliner chair , she is non verbal mostly respond to questions by hand gestures. She is a poor historian. As per staff pt is on hospice ,pt has been complaining of pain in her belly.  Denies nausea, vomiting  Last BM yesterday Refusing to go to dining room for meals but is accepting [employer]    Past Medical History:  Diagnosis Date   Arthritis    Edema    bilateral ankles   History of CVA (cerebrovascular accident) 02/11/2023   02/11/23 MOSE DNR, limited interventions, ABT/IVF if indicated, no feeding tube.      History of urethral stricture    freq/  urge   HOH (hard of hearing)    both ears   Hypertension    elevates legs    Urethral stricture    Urinary retention    Wears hearing aid    Past Surgical History:  Procedure Laterality Date   CYSTO/ URETHRAL DILATION/ FULGERATION OF BLEEDER  10-16-10   CYSTOSCOPY WITH URETHRAL DILATATION  07/02/2011   Procedure: CYSTOSCOPY WITH URETHRAL DILATATION;  Surgeon: Anner Crete;  Location: [facility];  Service: Urology;  Laterality: N/A;   CYSTOSCOPY WITH URETHRAL DILATATION N/A 08/24/2013   Procedure: CYSTOSCOPY WITH URETHRAL DILATATION;  Surgeon: Bjorn Pippin, MD;  Location: [facility];  Service: Urology;  Laterality: N/A;   CYSTOSCOPY WITH URETHRAL DILATATION N/A 05/02/2015   Procedure: CYSTOSCOPY WITH URETHRAL DILATATION, FULGURATION BLADDER NECK;  Surgeon: Bjorn Pippin, MD;  Location: [facility];  Service: Urology;  Laterality: N/A;   CYSTOSCOPY WITH URETHRAL DILATATION N/A 08/03/2017   Procedure: CYSTOSCOPY WITH URETHRAL DILATATION;  Surgeon: Bjorn Pippin, MD;  Location: [facility];  Service: Urology;  Laterality: N/A;   ORIF ORBITAL FRACTURE Right 07/29/2015   Procedure: RIGHT ORBITAL FLOOR REPAIR ;  Surgeon: Christia Reading, MD;  Location:  SUR[facility]ERY [facility];  Service: ENT;  Laterality: Right;    Allergies  Allergen  Reactions   Furosemide Other (See Comments)    Excessive Diarrhea.    Outpatient Encounter Medications as of 08/13/2023  Medication Sig   acetaminophen (TYLENOL) 325 MG tablet Take 325 mg by mouth every 6 (six) hours as needed for headache.   aspirin 81 MG tablet Take 81 mg by mouth at bedtime.   clopidogrel (PLAVIX) 75 MG tablet Take 75 mg by mouth daily.   diclofenac sodium (VOLTAREN) 1 % GEL Apply topically as needed.   pantoprazole (PROTONIX) 40 MG tablet Take 40 mg by mouth daily.   Calcium Citrate-Vitamin D (CALCIUM CITRATE +) 315-5 MG-MCG TABS Take 1 tablet by mouth daily. (Patient not taking:  Reported on 08/13/2023)   Cholecalciferol 50 MCG (2000 UT) TABS Take 1 tablet by mouth daily. (Patient not taking: Reported on 08/13/2023)   Multiple Vitamin (MULTIVITAMIN WITH MINERALS) TABS tablet Take 1 tablet by mouth daily. (Patient not taking: Reported on 07/13/2023)   olmesartan-hydrochlorothiazide (BENICAR HCT) 20-12.5 MG tablet Take 1 tablet by mouth daily. (Patient not taking: Reported on 07/13/2023)   simvastatin (ZOCOR) 20 MG tablet Take 20 mg by mouth at bedtime.   (Patient not taking: Reported on 07/13/2023)   No facility-administered encounter medications on file as of 08/13/2023.    Review of Systems  Immunization History  Administered Date(s) Administered   DTaP 02/20/2008   Influenza, High Dose Seasonal PF 05/19/2023   Influenza-Unspecified 05/17/2010   Pneumococcal-Unspecified 12/15/2005   Tdap 07/15/2015   Pertinent  Health Maintenance Due  Topic Date Due   DEXA SCAN  Never done   INFLUENZA VACCINE  Completed      12/24/2020   10:29 AM  Fall Risk  (RETIRED) Patient Fall Risk Level High fall risk   Functional Status Survey:    Vitals:   08/13/23 1107  BP: 139/80  Pulse: 84  Resp: 18  Temp: (!) 97.4 F (36.3 C)  SpO2: 98%  Weight: 109 lb (49.4 kg)  Height: 5\' 2"  (1.575 m)   Body mass index is 19.94 kg/m. Physical Exam Constitutional:      Appearance: Normal appearance.  Cardiovascular:     Rate and Rhythm: Normal rate and regular rhythm.  Pulmonary:     Effort: Pulmonary effort is normal. No respiratory distress.     Breath sounds: Normal breath sounds. No wheezing.  Abdominal:     General: Bowel sounds are normal. There is no distension.     Tenderness: There is abdominal tenderness (tenderness mid belly). There is no guarding or rebound.     Comments:    Musculoskeletal:        General: No swelling or tenderness.  Neurological:     Mental Status: She is alert. Mental status is at baseline.     Sensory: No sensory deficit.     Labs  reviewed: Recent Labs    03/30/23 0000 04/30/23 0000 06/11/23 0000  NA 134* 136* 137  K 4.1 3.5 4.6  CL 97* 102 102  CO2 28* 23* 29*  BUN 37* 70* 35*  CREATININE 1.5* 4.0* 1.4*  CALCIUM 9.5 8.8 9.0   Recent Labs    03/30/23 0000 04/30/23 0000  AST 14 11*  ALT 13 6*  ALKPHOS 58 51  ALBUMIN 4.0 3.2*   Recent Labs    03/30/23 0000 04/30/23 0000  WBC 6.2 5.7  NEUTROABS 4,644.00 3,819.00  HGB 11.2* 9.6*  HCT 34* 29*  PLT 322 261   Lab Results  Component Value Date   TSH 1.67 04/30/2023  No results found for: "HGBA1C" Lab Results  Component Value Date   CHOL 141 03/30/2023   HDL 49 03/30/2023   LDLCALC 75 03/30/2023   TRIG 85 03/30/2023    Significant Diagnostic Results in last 30 days:  No results found.  Assessment/Plan  1. Periumbilical abdominal pain (Primary) Pt with periumbilical pain  Will get cbc, bmp, lipase, UA if POA agrees Will get x ray KUB   2. Hospice care patient Pt with abdominal pain  Will get labs Will cont with tylenol prn for pain      Care plan discussed with the nursing staff  30 min Total time spent for obtaining history,  performing a medically appropriate examination and evaluation, reviewing the tests,ordering  tests,  documenting clinical information in the electronic or other health record, independently interpreting results ,care coordination (not separately reported)

## 2023-08-16 DIAGNOSIS — R296 Repeated falls: Secondary | ICD-10-CM | POA: Diagnosis not present

## 2023-08-16 DIAGNOSIS — K2100 Gastro-esophageal reflux disease with esophagitis, without bleeding: Secondary | ICD-10-CM | POA: Diagnosis not present

## 2023-08-16 DIAGNOSIS — I10 Essential (primary) hypertension: Secondary | ICD-10-CM | POA: Diagnosis not present

## 2023-08-16 DIAGNOSIS — R634 Abnormal weight loss: Secondary | ICD-10-CM | POA: Diagnosis not present

## 2023-08-16 DIAGNOSIS — E7849 Other hyperlipidemia: Secondary | ICD-10-CM | POA: Diagnosis not present

## 2023-08-16 DIAGNOSIS — I679 Cerebrovascular disease, unspecified: Secondary | ICD-10-CM | POA: Diagnosis not present

## 2023-08-18 DIAGNOSIS — E7849 Other hyperlipidemia: Secondary | ICD-10-CM | POA: Diagnosis not present

## 2023-08-18 DIAGNOSIS — K2100 Gastro-esophageal reflux disease with esophagitis, without bleeding: Secondary | ICD-10-CM | POA: Diagnosis not present

## 2023-08-18 DIAGNOSIS — I679 Cerebrovascular disease, unspecified: Secondary | ICD-10-CM | POA: Diagnosis not present

## 2023-08-18 DIAGNOSIS — R296 Repeated falls: Secondary | ICD-10-CM | POA: Diagnosis not present

## 2023-08-18 DIAGNOSIS — I10 Essential (primary) hypertension: Secondary | ICD-10-CM | POA: Diagnosis not present

## 2023-08-18 DIAGNOSIS — R634 Abnormal weight loss: Secondary | ICD-10-CM | POA: Diagnosis not present

## 2023-08-20 DIAGNOSIS — K2100 Gastro-esophageal reflux disease with esophagitis, without bleeding: Secondary | ICD-10-CM | POA: Diagnosis not present

## 2023-08-20 DIAGNOSIS — I679 Cerebrovascular disease, unspecified: Secondary | ICD-10-CM | POA: Diagnosis not present

## 2023-08-20 DIAGNOSIS — R296 Repeated falls: Secondary | ICD-10-CM | POA: Diagnosis not present

## 2023-08-20 DIAGNOSIS — R634 Abnormal weight loss: Secondary | ICD-10-CM | POA: Diagnosis not present

## 2023-08-20 DIAGNOSIS — E7849 Other hyperlipidemia: Secondary | ICD-10-CM | POA: Diagnosis not present

## 2023-08-20 DIAGNOSIS — I10 Essential (primary) hypertension: Secondary | ICD-10-CM | POA: Diagnosis not present

## 2023-08-21 DIAGNOSIS — R634 Abnormal weight loss: Secondary | ICD-10-CM | POA: Diagnosis not present

## 2023-08-21 DIAGNOSIS — R296 Repeated falls: Secondary | ICD-10-CM | POA: Diagnosis not present

## 2023-08-21 DIAGNOSIS — I679 Cerebrovascular disease, unspecified: Secondary | ICD-10-CM | POA: Diagnosis not present

## 2023-08-21 DIAGNOSIS — E7849 Other hyperlipidemia: Secondary | ICD-10-CM | POA: Diagnosis not present

## 2023-08-21 DIAGNOSIS — K2100 Gastro-esophageal reflux disease with esophagitis, without bleeding: Secondary | ICD-10-CM | POA: Diagnosis not present

## 2023-08-21 DIAGNOSIS — I10 Essential (primary) hypertension: Secondary | ICD-10-CM | POA: Diagnosis not present

## 2023-08-23 ENCOUNTER — Non-Acute Institutional Stay (SKILLED_NURSING_FACILITY): Payer: Self-pay | Admitting: Sports Medicine

## 2023-08-23 ENCOUNTER — Encounter: Payer: Self-pay | Admitting: Sports Medicine

## 2023-08-23 DIAGNOSIS — R109 Unspecified abdominal pain: Secondary | ICD-10-CM | POA: Diagnosis not present

## 2023-08-23 DIAGNOSIS — R1310 Dysphagia, unspecified: Secondary | ICD-10-CM | POA: Diagnosis not present

## 2023-08-23 DIAGNOSIS — E7849 Other hyperlipidemia: Secondary | ICD-10-CM | POA: Diagnosis not present

## 2023-08-23 DIAGNOSIS — K219 Gastro-esophageal reflux disease without esophagitis: Secondary | ICD-10-CM

## 2023-08-23 DIAGNOSIS — R1031 Right lower quadrant pain: Secondary | ICD-10-CM | POA: Diagnosis not present

## 2023-08-23 DIAGNOSIS — R634 Abnormal weight loss: Secondary | ICD-10-CM | POA: Diagnosis not present

## 2023-08-23 DIAGNOSIS — I10 Essential (primary) hypertension: Secondary | ICD-10-CM | POA: Diagnosis not present

## 2023-08-23 DIAGNOSIS — Z515 Encounter for palliative care: Secondary | ICD-10-CM

## 2023-08-23 DIAGNOSIS — K2100 Gastro-esophageal reflux disease with esophagitis, without bleeding: Secondary | ICD-10-CM | POA: Diagnosis not present

## 2023-08-23 DIAGNOSIS — Z8673 Personal history of transient ischemic attack (TIA), and cerebral infarction without residual deficits: Secondary | ICD-10-CM

## 2023-08-23 DIAGNOSIS — I679 Cerebrovascular disease, unspecified: Secondary | ICD-10-CM | POA: Diagnosis not present

## 2023-08-23 DIAGNOSIS — R296 Repeated falls: Secondary | ICD-10-CM | POA: Diagnosis not present

## 2023-08-23 NOTE — Progress Notes (Signed)
 Provider:  Jackalyn Blazing, MD Location:   [facility]  [place]   Place of Service:   Assisted living    PCP: Avva, Ravisankar, MD Patient Care Team: Avva, Ravisankar, MD as PCP - General (Internal Medicine)  Extended Emergency Contact Information Primary Emergency Contact: Teer,Michael  [place] Home Phone: [phone] Mobile Phone: [phone] Relation: Nephew Secondary Emergency Contact: Benton,Mary  [place] Home Phone: [phone] Relation: Other  Code Status:  Goals of Care: Advanced Directive information    08/13/2023   11:30 AM  Advanced Directives  Does Patient Have a Medical Advance Directive? No;Yes  Type of Advance Directive Out of facility DNR (pink MOST or yellow form)  Does patient want to make changes to medical advance directive? No - Patient declined  Would patient like information on creating a medical advance directive? No - Patient declined  Pre-existing out of facility DNR order (yellow form or pink MOST form) Yellow form placed in chart (order not valid for inpatient use)                                          HOSPICE RELATED VISIT   No chief complaint on file.   HPI: Patient is a 89 y.o. female seen today for  routine visit for routine visit Pt seen and examined in her room, she is nonverbal and communicates mostly by writing on the white board. As per staff pt stays mostly in her room  Not eating much  Pt denies cough, sob, chest pain, abdominal pain, nausea, vomiting, diarrhea Pt c/o pain in her belly , she had x ray done last week which shows There is a nonspecific bowel gas pattern without obstruction  Mild to moderate increase feces in the colon  No renal stone is seen  There is no osseous abnormality identified  Mild bladder distention suspected IMPRESSION Nonspecific bowel gas pattern without obstruction  Staff reported that pt c/o ulcer on her mouth  No ulcers found  but purplish  discoloration noted under lower lip and 1 tiny area of discoloration on Rt upper lip  Past Medical History:  Diagnosis Date   Arthritis    Edema    bilateral ankles   History of CVA (cerebrovascular accident) 02/11/2023   02/11/23 MOSE DNR, limited interventions, ABT/IVF if indicated, no feeding tube.      History of urethral stricture    freq/ urge   HOH (hard of hearing)    both ears   Hypertension    elevates legs    Urethral stricture    Urinary retention    Wears hearing aid    Past Surgical History:  Procedure Laterality Date   CYSTO/ URETHRAL DILATION/ FULGERATION OF BLEEDER  10-16-10   CYSTOSCOPY WITH URETHRAL DILATATION  07/02/2011   Procedure: CYSTOSCOPY WITH URETHRAL DILATATION;  Surgeon: Norleen JINNY Seltzer;  Location: [facility];  Service: Urology;  Laterality: N/A;   CYSTOSCOPY WITH URETHRAL DILATATION N/A 08/24/2013   Procedure: CYSTOSCOPY WITH URETHRAL DILATATION;  Surgeon: Norleen Seltzer, MD;  Location: [facility];  Service: Urology;  Laterality: N/A;   CYSTOSCOPY WITH URETHRAL DILATATION N/A 05/02/2015   Procedure: CYSTOSCOPY WITH URETHRAL DILATATION, FULGURATION BLADDER NECK;  Surgeon: Norleen Seltzer, MD;  Location: [facility];  Service: Urology;  Laterality: N/A;   CYSTOSCOPY WITH URETHRAL DILATATION N/A 08/03/2017   Procedure: CYSTOSCOPY WITH  URETHRAL DILATATION;  Surgeon: Watt Rush, MD;  Location: [facility];  Service: Urology;  Laterality: N/A;   ORIF ORBITAL FRACTURE Right 07/29/2015   Procedure: RIGHT ORBITAL FLOOR REPAIR ;  Surgeon: Vaughan Ricker, MD;  Location: [facility];  Service: ENT;  Laterality: Right;    reports that she quit smoking about 59 years ago. Her smoking use included cigarettes. She started smoking about 61 years ago. She has never used smokeless tobacco. She reports that she does not drink alcohol  and does not use drugs. Social History   Socioeconomic History   Marital  status: Single    Spouse name: Not on file   Number of children: Not on file   Years of education: Not on file   Highest education level: Not on file  Occupational History   Not on file  Tobacco Use   Smoking status: Former    Current packs/day: 0.00    Types: Cigarettes    Start date: 07/01/1962    Quit date: 07/01/1964    Years since quitting: 59.1   Smokeless tobacco: Never  Vaping Use   Vaping status: Never Used  Substance and Sexual Activity   Alcohol  use: No   Drug use: No   Sexual activity: Not on file  Other Topics Concern   Not on file  Social History Narrative   Not on file   Social Drivers of Health   Financial Resource Strain: Not on file  Food Insecurity: Not on file  Transportation Needs: Not on file  Physical Activity: Not on file  Stress: Not on file  Social Connections: Not on file  Intimate Partner Violence: Not on file    Functional Status Survey:    No family history on file.  Health Maintenance  Topic Date Due   Pneumonia Vaccine 15+ Years old (1 of 2 - PCV) 01/20/1934   Zoster Vaccines- Shingrix (1 of 2) Never done   DEXA SCAN  Never done   COVID-19 Vaccine (1 - 2024-25 season) Never done   DTaP/Tdap/Td (3 - Td or Tdap) 07/14/2025   INFLUENZA VACCINE  Completed   HPV VACCINES  Aged Out    Allergies  Allergen Reactions   Furosemide Other (See Comments)    Excessive Diarrhea.    Outpatient Encounter Medications as of 08/23/2023  Medication Sig   acetaminophen  (TYLENOL ) 325 MG tablet Take 325 mg by mouth every 6 (six) hours as needed for headache.   aspirin 81 MG tablet Take 81 mg by mouth at bedtime.   Calcium Citrate-Vitamin D  (CALCIUM CITRATE +) 315-5 MG-MCG TABS Take 1 tablet by mouth daily. (Patient not taking: Reported on 08/13/2023)   Cholecalciferol 50 MCG (2000 UT) TABS Take 1 tablet by mouth daily. (Patient not taking: Reported on 08/13/2023)   clopidogrel (PLAVIX) 75 MG tablet Take 75 mg by mouth daily.   diclofenac sodium  (VOLTAREN) 1 % GEL Apply topically as needed.   Multiple Vitamin (MULTIVITAMIN WITH MINERALS) TABS tablet Take 1 tablet by mouth daily. (Patient not taking: Reported on 07/13/2023)   olmesartan-hydrochlorothiazide  (BENICAR HCT) 20-12.5 MG tablet Take 1 tablet by mouth daily. (Patient not taking: Reported on 07/13/2023)   pantoprazole (PROTONIX) 40 MG tablet Take 40 mg by mouth daily.   simvastatin  (ZOCOR ) 20 MG tablet Take 20 mg by mouth at bedtime.   (Patient not taking: Reported on 07/13/2023)   No facility-administered encounter medications on file as of 08/23/2023.    Review of Systems  Constitutional:  Negative  for fever.  Respiratory:  Negative for cough and shortness of breath.   Cardiovascular:  Negative for chest pain and leg swelling.  Gastrointestinal:  Negative for abdominal pain, constipation, nausea and vomiting.  Genitourinary:  Negative for dysuria.  Neurological:  Negative for dizziness, weakness and numbness.    There were no vitals filed for this visit. There is no height or weight on file to calculate BMI. Physical Exam Constitutional:      Appearance: Normal appearance.  Cardiovascular:     Rate and Rhythm: Normal rate and regular rhythm.  Pulmonary:     Effort: Pulmonary effort is normal. No respiratory distress.     Breath sounds: Normal breath sounds. No wheezing.  Abdominal:     General: Bowel sounds are normal. There is no distension.     Tenderness: There is abdominal tenderness (RLQ tenderness). There is no guarding or rebound.     Comments:    Musculoskeletal:        General: No swelling or tenderness.  Neurological:     Mental Status: She is alert. Mental status is at baseline.     Labs reviewed: Basic Metabolic Panel: Recent Labs    03/30/23 0000 04/30/23 0000 06/11/23 0000  NA 134* 136* 137  K 4.1 3.5 4.6  CL 97* 102 102  CO2 28* 23* 29*  BUN 37* 70* 35*  CREATININE 1.5* 4.0* 1.4*  CALCIUM 9.5 8.8 9.0   Liver Function Tests: Recent  Labs    03/30/23 0000 04/30/23 0000  AST 14 11*  ALT 13 6*  ALKPHOS 58 51  ALBUMIN 4.0 3.2*   No results for input(s): LIPASE, AMYLASE in the last 8760 hours. No results for input(s): AMMONIA in the last 8760 hours. CBC: Recent Labs    03/30/23 0000 04/30/23 0000  WBC 6.2 5.7  NEUTROABS 4,644.00 3,819.00  HGB 11.2* 9.6*  HCT 34* 29*  PLT 322 261   Cardiac Enzymes: No results for input(s): CKTOTAL, CKMB, CKMBINDEX, TROPONINI in the last 8760 hours. BNP: Invalid input(s): POCBNP No results found for: HGBA1C Lab Results  Component Value Date   TSH 1.67 04/30/2023   No results found for: VITAMINB12 No results found for: FOLATE No results found for: IRON, TIBC, FERRITIN  Imaging and Procedures obtained prior to SNF admission: DG Shoulder Right Result Date: 09/09/2022 CLINICAL DATA:  Fall today. Lost balance leading to fall on to ground. EXAM: RIGHT SHOULDER - 2+ VIEW COMPARISON:  None Available. FINDINGS: There is no evidence of fracture or dislocation. Mild acromioclavicular degenerative spurring. Subcortical cystic change the lateral humeral head. Soft tissue edema laterally. IMPRESSION: 1. Lateral soft tissue edema without acute fracture or dislocation. 2. Mild acromioclavicular degenerative spurring. Electronically Signed   By: Andrea Gasman M.D.   On: 09/09/2022 18:26   DG Elbow Complete Right Result Date: 09/09/2022 CLINICAL DATA:  Fall today. Lost balance leading to fall on to ground. EXAM: RIGHT ELBOW - COMPLETE 3+ VIEW COMPARISON:  None Available. FINDINGS: No acute fracture or dislocation. Lateral view is slightly limited due to positioning, allowing for this no significant joint effusion. Mild degenerative spurring. There is soft tissue edema laterally. IMPRESSION: Soft tissue edema without acute fracture or dislocation. Electronically Signed   By: Andrea Gasman M.D.   On: 09/09/2022 18:24   DG Chest 1 View Result Date:  09/09/2022 CLINICAL DATA:  Fall today. Lost balance leading to fall on to ground. EXAM: CHEST  1 VIEW COMPARISON:  Radiograph 12/24/2020 FINDINGS: Patient is rotated. Normal  heart size with stable mediastinal contours. Aortic atherosclerosis. No pneumothorax, large pleural effusion or focal airspace disease. No evidence of acute fracture. IMPRESSION: No acute abnormality or evidence of traumatic injury. Electronically Signed   By: Andrea Gasman M.D.   On: 09/09/2022 18:23   DG Humerus Right Result Date: 09/09/2022 CLINICAL DATA:  Fall today. Lost balance leading to fall on to ground. EXAM: RIGHT HUMERUS - 2+ VIEW COMPARISON:  None Available. FINDINGS: There is no evidence of fracture or other focal bone lesions. Cortical margins of the humerus are intact. Soft tissue edema is seen proximal laterally. IMPRESSION: No acute fracture of the humerus. Electronically Signed   By: Andrea Gasman M.D.   On: 09/09/2022 18:19   CT Head Wo Contrast Result Date: 09/09/2022 CLINICAL DATA:  Provided history: Polytrauma, blunt.  Fall. EXAM: CT HEAD WITHOUT CONTRAST CT CERVICAL SPINE WITHOUT CONTRAST TECHNIQUE: Multidetector CT imaging of the head and cervical spine was performed following the standard protocol without intravenous contrast. Multiplanar CT image reconstructions of the cervical spine were also generated. RADIATION DOSE REDUCTION: This exam was performed according to the departmental dose-optimization program which includes automated exposure control, adjustment of the mA and/or kV according to patient size and/or use of iterative reconstruction technique. COMPARISON:  Brain MRI 09/17/2021. Head CT 07/15/2015. Maxillofacial CT 07/15/2015. FINDINGS: CT HEAD FINDINGS Brain: Generalized cerebral atrophy. Moderate patchy and ill-defined hypoattenuation within the cerebral white matter, nonspecific but compatible with chronic small vessel disease. As before, there are multiple small hypodensities within the  bilateral deep gray nuclei and left internal capsule which likely reflect a combination of prominent perivascular spaces and chronic lacunar infarcts. There is no acute intracranial hemorrhage. No demarcated cortical infarct. No extra-axial fluid collection. No evidence of an intracranial mass. No midline shift. Vascular: No hyperdense vessel. Atherosclerotic calcifications. Skull: No fracture or aggressive osseous lesion. Sinuses/Orbits: No mass or acute finding within the imaged orbits. Prior metallic mesh repair of a right orbital floor fracture. Mild mucosal thickening within the left maxillary sinus. Other: Right frontal scalp/forehead hematoma. CT CERVICAL SPINE FINDINGS Alignment: Straightening of the expected cervical lordosis. No significant spondylolisthesis. Skull base and vertebrae: The basion-dental and atlanto-dental intervals are maintained.No evidence of acute fracture to the cervical spine. Soft tissues and spinal canal: No prevertebral fluid or swelling. No visible canal hematoma. Disc levels: Cervical spondylosis with multilevel disc space narrowing, disc bulges/central disc protrusions, endplate spurring, uncovertebral hypertrophy and facet arthrosis. No appreciable high-grade spinal canal stenosis. Multilevel neural foraminal narrowing. Upper chest: No consolidation within the imaged lung apices. Biapical pleuroparenchymal scarring. IMPRESSION: CT head: 1. No evidence of acute intracranial abnormality. 2. Right anterior scalp/forehead hematoma. 3. Parenchymal atrophy and chronic small vessel disease, as described. 4. Mild mucosal thickening within the left maxillary sinus. CT cervical spine: 1. No evidence of acute fracture to the cervical spine. 2. Nonspecific straightening of the expected cervical lordosis. 3. Cervical spondylosis. Electronically Signed   By: Rockey Childs D.O.   On: 09/09/2022 16:49   CT Cervical Spine Wo Contrast Result Date: 09/09/2022 CLINICAL DATA:  Provided history:  Polytrauma, blunt.  Fall. EXAM: CT HEAD WITHOUT CONTRAST CT CERVICAL SPINE WITHOUT CONTRAST TECHNIQUE: Multidetector CT imaging of the head and cervical spine was performed following the standard protocol without intravenous contrast. Multiplanar CT image reconstructions of the cervical spine were also generated. RADIATION DOSE REDUCTION: This exam was performed according to the departmental dose-optimization program which includes automated exposure control, adjustment of the mA and/or kV according to patient  size and/or use of iterative reconstruction technique. COMPARISON:  Brain MRI 09/17/2021. Head CT 07/15/2015. Maxillofacial CT 07/15/2015. FINDINGS: CT HEAD FINDINGS Brain: Generalized cerebral atrophy. Moderate patchy and ill-defined hypoattenuation within the cerebral white matter, nonspecific but compatible with chronic small vessel disease. As before, there are multiple small hypodensities within the bilateral deep gray nuclei and left internal capsule which likely reflect a combination of prominent perivascular spaces and chronic lacunar infarcts. There is no acute intracranial hemorrhage. No demarcated cortical infarct. No extra-axial fluid collection. No evidence of an intracranial mass. No midline shift. Vascular: No hyperdense vessel. Atherosclerotic calcifications. Skull: No fracture or aggressive osseous lesion. Sinuses/Orbits: No mass or acute finding within the imaged orbits. Prior metallic mesh repair of a right orbital floor fracture. Mild mucosal thickening within the left maxillary sinus. Other: Right frontal scalp/forehead hematoma. CT CERVICAL SPINE FINDINGS Alignment: Straightening of the expected cervical lordosis. No significant spondylolisthesis. Skull base and vertebrae: The basion-dental and atlanto-dental intervals are maintained.No evidence of acute fracture to the cervical spine. Soft tissues and spinal canal: No prevertebral fluid or swelling. No visible canal hematoma. Disc  levels: Cervical spondylosis with multilevel disc space narrowing, disc bulges/central disc protrusions, endplate spurring, uncovertebral hypertrophy and facet arthrosis. No appreciable high-grade spinal canal stenosis. Multilevel neural foraminal narrowing. Upper chest: No consolidation within the imaged lung apices. Biapical pleuroparenchymal scarring. IMPRESSION: CT head: 1. No evidence of acute intracranial abnormality. 2. Right anterior scalp/forehead hematoma. 3. Parenchymal atrophy and chronic small vessel disease, as described. 4. Mild mucosal thickening within the left maxillary sinus. CT cervical spine: 1. No evidence of acute fracture to the cervical spine. 2. Nonspecific straightening of the expected cervical lordosis. 3. Cervical spondylosis. Electronically Signed   By: Rockey Childs D.O.   On: 09/09/2022 16:49    Assessment/Plan  1. Right lower quadrant abdominal pain (Primary) Pt c/o pain in mid gastric and Rlq on palpation  Will get UA  Labs reviewed from last week  no leukocytosis  Cr 1.03  Sodium wnl   2. Dysphagia, unspecified type Cont with pureed diet and thin liquids  3. Hospice care patient Pt c/o belly pain  X ray showed no obstruction  Will start colace 100mg  twice daily  Will start miralax prn   4. Gastroesophageal reflux disease, unspecified whether esophagitis present Cont with protonix   5. History of CVA (cerebrovascular accident) Cont with asp, plavix,  Consider stopping in the future  Mouth ulcers No ulcers noted Will prescribe magic mouth wash     Care plan discussed with the nursing staff  30 min Total time spent for obtaining history,  performing a medically appropriate examination and evaluation, reviewing the tests,ordering  tests,  documenting clinical information in the electronic or other health record, independently interpreting results ,care coordination (not separately reported)

## 2023-08-27 DIAGNOSIS — I679 Cerebrovascular disease, unspecified: Secondary | ICD-10-CM | POA: Diagnosis not present

## 2023-08-27 DIAGNOSIS — I10 Essential (primary) hypertension: Secondary | ICD-10-CM | POA: Diagnosis not present

## 2023-08-27 DIAGNOSIS — E7849 Other hyperlipidemia: Secondary | ICD-10-CM | POA: Diagnosis not present

## 2023-08-27 DIAGNOSIS — R296 Repeated falls: Secondary | ICD-10-CM | POA: Diagnosis not present

## 2023-08-27 DIAGNOSIS — R634 Abnormal weight loss: Secondary | ICD-10-CM | POA: Diagnosis not present

## 2023-08-27 DIAGNOSIS — K2100 Gastro-esophageal reflux disease with esophagitis, without bleeding: Secondary | ICD-10-CM | POA: Diagnosis not present

## 2023-08-31 DIAGNOSIS — I679 Cerebrovascular disease, unspecified: Secondary | ICD-10-CM | POA: Diagnosis not present

## 2023-08-31 DIAGNOSIS — I10 Essential (primary) hypertension: Secondary | ICD-10-CM | POA: Diagnosis not present

## 2023-08-31 DIAGNOSIS — E7849 Other hyperlipidemia: Secondary | ICD-10-CM | POA: Diagnosis not present

## 2023-08-31 DIAGNOSIS — R296 Repeated falls: Secondary | ICD-10-CM | POA: Diagnosis not present

## 2023-08-31 DIAGNOSIS — R634 Abnormal weight loss: Secondary | ICD-10-CM | POA: Diagnosis not present

## 2023-08-31 DIAGNOSIS — K2100 Gastro-esophageal reflux disease with esophagitis, without bleeding: Secondary | ICD-10-CM | POA: Diagnosis not present

## 2023-09-01 DIAGNOSIS — R296 Repeated falls: Secondary | ICD-10-CM | POA: Diagnosis not present

## 2023-09-01 DIAGNOSIS — I10 Essential (primary) hypertension: Secondary | ICD-10-CM | POA: Diagnosis not present

## 2023-09-01 DIAGNOSIS — I679 Cerebrovascular disease, unspecified: Secondary | ICD-10-CM | POA: Diagnosis not present

## 2023-09-01 DIAGNOSIS — K2100 Gastro-esophageal reflux disease with esophagitis, without bleeding: Secondary | ICD-10-CM | POA: Diagnosis not present

## 2023-09-01 DIAGNOSIS — R634 Abnormal weight loss: Secondary | ICD-10-CM | POA: Diagnosis not present

## 2023-09-01 DIAGNOSIS — E7849 Other hyperlipidemia: Secondary | ICD-10-CM | POA: Diagnosis not present

## 2023-09-03 DIAGNOSIS — R296 Repeated falls: Secondary | ICD-10-CM | POA: Diagnosis not present

## 2023-09-03 DIAGNOSIS — E7849 Other hyperlipidemia: Secondary | ICD-10-CM | POA: Diagnosis not present

## 2023-09-03 DIAGNOSIS — R634 Abnormal weight loss: Secondary | ICD-10-CM | POA: Diagnosis not present

## 2023-09-03 DIAGNOSIS — I10 Essential (primary) hypertension: Secondary | ICD-10-CM | POA: Diagnosis not present

## 2023-09-03 DIAGNOSIS — I679 Cerebrovascular disease, unspecified: Secondary | ICD-10-CM | POA: Diagnosis not present

## 2023-09-03 DIAGNOSIS — K2100 Gastro-esophageal reflux disease with esophagitis, without bleeding: Secondary | ICD-10-CM | POA: Diagnosis not present

## 2023-09-07 DIAGNOSIS — I10 Essential (primary) hypertension: Secondary | ICD-10-CM | POA: Diagnosis not present

## 2023-09-07 DIAGNOSIS — E7849 Other hyperlipidemia: Secondary | ICD-10-CM | POA: Diagnosis not present

## 2023-09-07 DIAGNOSIS — R634 Abnormal weight loss: Secondary | ICD-10-CM | POA: Diagnosis not present

## 2023-09-07 DIAGNOSIS — R296 Repeated falls: Secondary | ICD-10-CM | POA: Diagnosis not present

## 2023-09-07 DIAGNOSIS — I679 Cerebrovascular disease, unspecified: Secondary | ICD-10-CM | POA: Diagnosis not present

## 2023-09-07 DIAGNOSIS — K2100 Gastro-esophageal reflux disease with esophagitis, without bleeding: Secondary | ICD-10-CM | POA: Diagnosis not present

## 2023-09-09 DIAGNOSIS — K2100 Gastro-esophageal reflux disease with esophagitis, without bleeding: Secondary | ICD-10-CM | POA: Diagnosis not present

## 2023-09-09 DIAGNOSIS — I679 Cerebrovascular disease, unspecified: Secondary | ICD-10-CM | POA: Diagnosis not present

## 2023-09-09 DIAGNOSIS — R634 Abnormal weight loss: Secondary | ICD-10-CM | POA: Diagnosis not present

## 2023-09-09 DIAGNOSIS — I10 Essential (primary) hypertension: Secondary | ICD-10-CM | POA: Diagnosis not present

## 2023-09-09 DIAGNOSIS — R296 Repeated falls: Secondary | ICD-10-CM | POA: Diagnosis not present

## 2023-09-09 DIAGNOSIS — E7849 Other hyperlipidemia: Secondary | ICD-10-CM | POA: Diagnosis not present

## 2023-09-13 DIAGNOSIS — E7849 Other hyperlipidemia: Secondary | ICD-10-CM | POA: Diagnosis not present

## 2023-09-13 DIAGNOSIS — I679 Cerebrovascular disease, unspecified: Secondary | ICD-10-CM | POA: Diagnosis not present

## 2023-09-13 DIAGNOSIS — R296 Repeated falls: Secondary | ICD-10-CM | POA: Diagnosis not present

## 2023-09-13 DIAGNOSIS — K2100 Gastro-esophageal reflux disease with esophagitis, without bleeding: Secondary | ICD-10-CM | POA: Diagnosis not present

## 2023-09-13 DIAGNOSIS — I10 Essential (primary) hypertension: Secondary | ICD-10-CM | POA: Diagnosis not present

## 2023-09-13 DIAGNOSIS — R634 Abnormal weight loss: Secondary | ICD-10-CM | POA: Diagnosis not present

## 2023-09-17 DIAGNOSIS — I10 Essential (primary) hypertension: Secondary | ICD-10-CM | POA: Diagnosis not present

## 2023-09-17 DIAGNOSIS — R296 Repeated falls: Secondary | ICD-10-CM | POA: Diagnosis not present

## 2023-09-17 DIAGNOSIS — K2100 Gastro-esophageal reflux disease with esophagitis, without bleeding: Secondary | ICD-10-CM | POA: Diagnosis not present

## 2023-09-17 DIAGNOSIS — I679 Cerebrovascular disease, unspecified: Secondary | ICD-10-CM | POA: Diagnosis not present

## 2023-09-17 DIAGNOSIS — E7849 Other hyperlipidemia: Secondary | ICD-10-CM | POA: Diagnosis not present

## 2023-09-17 DIAGNOSIS — R634 Abnormal weight loss: Secondary | ICD-10-CM | POA: Diagnosis not present

## 2023-09-18 DIAGNOSIS — I679 Cerebrovascular disease, unspecified: Secondary | ICD-10-CM | POA: Diagnosis not present

## 2023-09-18 DIAGNOSIS — R634 Abnormal weight loss: Secondary | ICD-10-CM | POA: Diagnosis not present

## 2023-09-18 DIAGNOSIS — R296 Repeated falls: Secondary | ICD-10-CM | POA: Diagnosis not present

## 2023-09-18 DIAGNOSIS — E7849 Other hyperlipidemia: Secondary | ICD-10-CM | POA: Diagnosis not present

## 2023-09-18 DIAGNOSIS — I10 Essential (primary) hypertension: Secondary | ICD-10-CM | POA: Diagnosis not present

## 2023-09-18 DIAGNOSIS — K2100 Gastro-esophageal reflux disease with esophagitis, without bleeding: Secondary | ICD-10-CM | POA: Diagnosis not present

## 2023-09-20 DIAGNOSIS — R296 Repeated falls: Secondary | ICD-10-CM | POA: Diagnosis not present

## 2023-09-20 DIAGNOSIS — R634 Abnormal weight loss: Secondary | ICD-10-CM | POA: Diagnosis not present

## 2023-09-20 DIAGNOSIS — E7849 Other hyperlipidemia: Secondary | ICD-10-CM | POA: Diagnosis not present

## 2023-09-20 DIAGNOSIS — I679 Cerebrovascular disease, unspecified: Secondary | ICD-10-CM | POA: Diagnosis not present

## 2023-09-20 DIAGNOSIS — K2100 Gastro-esophageal reflux disease with esophagitis, without bleeding: Secondary | ICD-10-CM | POA: Diagnosis not present

## 2023-09-20 DIAGNOSIS — I10 Essential (primary) hypertension: Secondary | ICD-10-CM | POA: Diagnosis not present

## 2023-09-24 DIAGNOSIS — I10 Essential (primary) hypertension: Secondary | ICD-10-CM | POA: Diagnosis not present

## 2023-09-24 DIAGNOSIS — I679 Cerebrovascular disease, unspecified: Secondary | ICD-10-CM | POA: Diagnosis not present

## 2023-09-24 DIAGNOSIS — R634 Abnormal weight loss: Secondary | ICD-10-CM | POA: Diagnosis not present

## 2023-09-24 DIAGNOSIS — K2100 Gastro-esophageal reflux disease with esophagitis, without bleeding: Secondary | ICD-10-CM | POA: Diagnosis not present

## 2023-09-24 DIAGNOSIS — R296 Repeated falls: Secondary | ICD-10-CM | POA: Diagnosis not present

## 2023-09-24 DIAGNOSIS — E7849 Other hyperlipidemia: Secondary | ICD-10-CM | POA: Diagnosis not present

## 2023-09-27 DIAGNOSIS — R296 Repeated falls: Secondary | ICD-10-CM | POA: Diagnosis not present

## 2023-09-27 DIAGNOSIS — K2100 Gastro-esophageal reflux disease with esophagitis, without bleeding: Secondary | ICD-10-CM | POA: Diagnosis not present

## 2023-09-27 DIAGNOSIS — I10 Essential (primary) hypertension: Secondary | ICD-10-CM | POA: Diagnosis not present

## 2023-09-27 DIAGNOSIS — I679 Cerebrovascular disease, unspecified: Secondary | ICD-10-CM | POA: Diagnosis not present

## 2023-09-27 DIAGNOSIS — R634 Abnormal weight loss: Secondary | ICD-10-CM | POA: Diagnosis not present

## 2023-09-27 DIAGNOSIS — E7849 Other hyperlipidemia: Secondary | ICD-10-CM | POA: Diagnosis not present

## 2023-10-01 DIAGNOSIS — K2100 Gastro-esophageal reflux disease with esophagitis, without bleeding: Secondary | ICD-10-CM | POA: Diagnosis not present

## 2023-10-01 DIAGNOSIS — R634 Abnormal weight loss: Secondary | ICD-10-CM | POA: Diagnosis not present

## 2023-10-01 DIAGNOSIS — R296 Repeated falls: Secondary | ICD-10-CM | POA: Diagnosis not present

## 2023-10-01 DIAGNOSIS — I10 Essential (primary) hypertension: Secondary | ICD-10-CM | POA: Diagnosis not present

## 2023-10-01 DIAGNOSIS — E7849 Other hyperlipidemia: Secondary | ICD-10-CM | POA: Diagnosis not present

## 2023-10-01 DIAGNOSIS — I679 Cerebrovascular disease, unspecified: Secondary | ICD-10-CM | POA: Diagnosis not present

## 2023-10-04 DIAGNOSIS — I679 Cerebrovascular disease, unspecified: Secondary | ICD-10-CM | POA: Diagnosis not present

## 2023-10-04 DIAGNOSIS — K2100 Gastro-esophageal reflux disease with esophagitis, without bleeding: Secondary | ICD-10-CM | POA: Diagnosis not present

## 2023-10-04 DIAGNOSIS — R296 Repeated falls: Secondary | ICD-10-CM | POA: Diagnosis not present

## 2023-10-04 DIAGNOSIS — R634 Abnormal weight loss: Secondary | ICD-10-CM | POA: Diagnosis not present

## 2023-10-04 DIAGNOSIS — E7849 Other hyperlipidemia: Secondary | ICD-10-CM | POA: Diagnosis not present

## 2023-10-04 DIAGNOSIS — I10 Essential (primary) hypertension: Secondary | ICD-10-CM | POA: Diagnosis not present

## 2023-10-07 DIAGNOSIS — E7849 Other hyperlipidemia: Secondary | ICD-10-CM | POA: Diagnosis not present

## 2023-10-07 DIAGNOSIS — K2100 Gastro-esophageal reflux disease with esophagitis, without bleeding: Secondary | ICD-10-CM | POA: Diagnosis not present

## 2023-10-07 DIAGNOSIS — R634 Abnormal weight loss: Secondary | ICD-10-CM | POA: Diagnosis not present

## 2023-10-07 DIAGNOSIS — I10 Essential (primary) hypertension: Secondary | ICD-10-CM | POA: Diagnosis not present

## 2023-10-07 DIAGNOSIS — I679 Cerebrovascular disease, unspecified: Secondary | ICD-10-CM | POA: Diagnosis not present

## 2023-10-07 DIAGNOSIS — R296 Repeated falls: Secondary | ICD-10-CM | POA: Diagnosis not present

## 2023-10-11 DIAGNOSIS — E7849 Other hyperlipidemia: Secondary | ICD-10-CM | POA: Diagnosis not present

## 2023-10-11 DIAGNOSIS — K2100 Gastro-esophageal reflux disease with esophagitis, without bleeding: Secondary | ICD-10-CM | POA: Diagnosis not present

## 2023-10-11 DIAGNOSIS — R296 Repeated falls: Secondary | ICD-10-CM | POA: Diagnosis not present

## 2023-10-11 DIAGNOSIS — I10 Essential (primary) hypertension: Secondary | ICD-10-CM | POA: Diagnosis not present

## 2023-10-11 DIAGNOSIS — I679 Cerebrovascular disease, unspecified: Secondary | ICD-10-CM | POA: Diagnosis not present

## 2023-10-11 DIAGNOSIS — R634 Abnormal weight loss: Secondary | ICD-10-CM | POA: Diagnosis not present

## 2023-10-15 DIAGNOSIS — K2100 Gastro-esophageal reflux disease with esophagitis, without bleeding: Secondary | ICD-10-CM | POA: Diagnosis not present

## 2023-10-15 DIAGNOSIS — R296 Repeated falls: Secondary | ICD-10-CM | POA: Diagnosis not present

## 2023-10-15 DIAGNOSIS — I10 Essential (primary) hypertension: Secondary | ICD-10-CM | POA: Diagnosis not present

## 2023-10-15 DIAGNOSIS — I679 Cerebrovascular disease, unspecified: Secondary | ICD-10-CM | POA: Diagnosis not present

## 2023-10-15 DIAGNOSIS — E7849 Other hyperlipidemia: Secondary | ICD-10-CM | POA: Diagnosis not present

## 2023-10-15 DIAGNOSIS — R634 Abnormal weight loss: Secondary | ICD-10-CM | POA: Diagnosis not present

## 2023-10-16 DIAGNOSIS — I679 Cerebrovascular disease, unspecified: Secondary | ICD-10-CM | POA: Diagnosis not present

## 2023-10-16 DIAGNOSIS — R634 Abnormal weight loss: Secondary | ICD-10-CM | POA: Diagnosis not present

## 2023-10-16 DIAGNOSIS — I10 Essential (primary) hypertension: Secondary | ICD-10-CM | POA: Diagnosis not present

## 2023-10-16 DIAGNOSIS — R296 Repeated falls: Secondary | ICD-10-CM | POA: Diagnosis not present

## 2023-10-16 DIAGNOSIS — E7849 Other hyperlipidemia: Secondary | ICD-10-CM | POA: Diagnosis not present

## 2023-10-16 DIAGNOSIS — K2100 Gastro-esophageal reflux disease with esophagitis, without bleeding: Secondary | ICD-10-CM | POA: Diagnosis not present

## 2023-10-19 DIAGNOSIS — R296 Repeated falls: Secondary | ICD-10-CM | POA: Diagnosis not present

## 2023-10-19 DIAGNOSIS — E7849 Other hyperlipidemia: Secondary | ICD-10-CM | POA: Diagnosis not present

## 2023-10-19 DIAGNOSIS — I10 Essential (primary) hypertension: Secondary | ICD-10-CM | POA: Diagnosis not present

## 2023-10-19 DIAGNOSIS — K2100 Gastro-esophageal reflux disease with esophagitis, without bleeding: Secondary | ICD-10-CM | POA: Diagnosis not present

## 2023-10-19 DIAGNOSIS — I679 Cerebrovascular disease, unspecified: Secondary | ICD-10-CM | POA: Diagnosis not present

## 2023-10-19 DIAGNOSIS — R634 Abnormal weight loss: Secondary | ICD-10-CM | POA: Diagnosis not present

## 2023-10-21 DIAGNOSIS — I679 Cerebrovascular disease, unspecified: Secondary | ICD-10-CM | POA: Diagnosis not present

## 2023-10-21 DIAGNOSIS — R634 Abnormal weight loss: Secondary | ICD-10-CM | POA: Diagnosis not present

## 2023-10-21 DIAGNOSIS — E7849 Other hyperlipidemia: Secondary | ICD-10-CM | POA: Diagnosis not present

## 2023-10-21 DIAGNOSIS — R296 Repeated falls: Secondary | ICD-10-CM | POA: Diagnosis not present

## 2023-10-21 DIAGNOSIS — K2100 Gastro-esophageal reflux disease with esophagitis, without bleeding: Secondary | ICD-10-CM | POA: Diagnosis not present

## 2023-10-21 DIAGNOSIS — I10 Essential (primary) hypertension: Secondary | ICD-10-CM | POA: Diagnosis not present

## 2023-10-25 DIAGNOSIS — R296 Repeated falls: Secondary | ICD-10-CM | POA: Diagnosis not present

## 2023-10-25 DIAGNOSIS — R634 Abnormal weight loss: Secondary | ICD-10-CM | POA: Diagnosis not present

## 2023-10-25 DIAGNOSIS — K2100 Gastro-esophageal reflux disease with esophagitis, without bleeding: Secondary | ICD-10-CM | POA: Diagnosis not present

## 2023-10-25 DIAGNOSIS — I679 Cerebrovascular disease, unspecified: Secondary | ICD-10-CM | POA: Diagnosis not present

## 2023-10-25 DIAGNOSIS — I10 Essential (primary) hypertension: Secondary | ICD-10-CM | POA: Diagnosis not present

## 2023-10-25 DIAGNOSIS — E7849 Other hyperlipidemia: Secondary | ICD-10-CM | POA: Diagnosis not present

## 2023-10-26 DIAGNOSIS — I10 Essential (primary) hypertension: Secondary | ICD-10-CM | POA: Diagnosis not present

## 2023-10-26 DIAGNOSIS — I679 Cerebrovascular disease, unspecified: Secondary | ICD-10-CM | POA: Diagnosis not present

## 2023-10-26 DIAGNOSIS — K2100 Gastro-esophageal reflux disease with esophagitis, without bleeding: Secondary | ICD-10-CM | POA: Diagnosis not present

## 2023-10-26 DIAGNOSIS — R296 Repeated falls: Secondary | ICD-10-CM | POA: Diagnosis not present

## 2023-10-26 DIAGNOSIS — E7849 Other hyperlipidemia: Secondary | ICD-10-CM | POA: Diagnosis not present

## 2023-10-26 DIAGNOSIS — R634 Abnormal weight loss: Secondary | ICD-10-CM | POA: Diagnosis not present

## 2023-10-28 DIAGNOSIS — I679 Cerebrovascular disease, unspecified: Secondary | ICD-10-CM | POA: Diagnosis not present

## 2023-10-28 DIAGNOSIS — K2100 Gastro-esophageal reflux disease with esophagitis, without bleeding: Secondary | ICD-10-CM | POA: Diagnosis not present

## 2023-10-28 DIAGNOSIS — R296 Repeated falls: Secondary | ICD-10-CM | POA: Diagnosis not present

## 2023-10-28 DIAGNOSIS — E7849 Other hyperlipidemia: Secondary | ICD-10-CM | POA: Diagnosis not present

## 2023-10-28 DIAGNOSIS — R634 Abnormal weight loss: Secondary | ICD-10-CM | POA: Diagnosis not present

## 2023-10-28 DIAGNOSIS — I10 Essential (primary) hypertension: Secondary | ICD-10-CM | POA: Diagnosis not present

## 2023-11-02 DIAGNOSIS — R634 Abnormal weight loss: Secondary | ICD-10-CM | POA: Diagnosis not present

## 2023-11-02 DIAGNOSIS — I679 Cerebrovascular disease, unspecified: Secondary | ICD-10-CM | POA: Diagnosis not present

## 2023-11-02 DIAGNOSIS — E7849 Other hyperlipidemia: Secondary | ICD-10-CM | POA: Diagnosis not present

## 2023-11-02 DIAGNOSIS — I10 Essential (primary) hypertension: Secondary | ICD-10-CM | POA: Diagnosis not present

## 2023-11-02 DIAGNOSIS — K2100 Gastro-esophageal reflux disease with esophagitis, without bleeding: Secondary | ICD-10-CM | POA: Diagnosis not present

## 2023-11-02 DIAGNOSIS — R296 Repeated falls: Secondary | ICD-10-CM | POA: Diagnosis not present

## 2023-11-05 DIAGNOSIS — I10 Essential (primary) hypertension: Secondary | ICD-10-CM | POA: Diagnosis not present

## 2023-11-05 DIAGNOSIS — E7849 Other hyperlipidemia: Secondary | ICD-10-CM | POA: Diagnosis not present

## 2023-11-05 DIAGNOSIS — R296 Repeated falls: Secondary | ICD-10-CM | POA: Diagnosis not present

## 2023-11-05 DIAGNOSIS — I679 Cerebrovascular disease, unspecified: Secondary | ICD-10-CM | POA: Diagnosis not present

## 2023-11-05 DIAGNOSIS — R634 Abnormal weight loss: Secondary | ICD-10-CM | POA: Diagnosis not present

## 2023-11-05 DIAGNOSIS — K2100 Gastro-esophageal reflux disease with esophagitis, without bleeding: Secondary | ICD-10-CM | POA: Diagnosis not present

## 2023-11-09 DIAGNOSIS — R296 Repeated falls: Secondary | ICD-10-CM | POA: Diagnosis not present

## 2023-11-09 DIAGNOSIS — K2100 Gastro-esophageal reflux disease with esophagitis, without bleeding: Secondary | ICD-10-CM | POA: Diagnosis not present

## 2023-11-09 DIAGNOSIS — I679 Cerebrovascular disease, unspecified: Secondary | ICD-10-CM | POA: Diagnosis not present

## 2023-11-09 DIAGNOSIS — E7849 Other hyperlipidemia: Secondary | ICD-10-CM | POA: Diagnosis not present

## 2023-11-09 DIAGNOSIS — I10 Essential (primary) hypertension: Secondary | ICD-10-CM | POA: Diagnosis not present

## 2023-11-09 DIAGNOSIS — R634 Abnormal weight loss: Secondary | ICD-10-CM | POA: Diagnosis not present

## 2023-11-12 DIAGNOSIS — I679 Cerebrovascular disease, unspecified: Secondary | ICD-10-CM | POA: Diagnosis not present

## 2023-11-12 DIAGNOSIS — R634 Abnormal weight loss: Secondary | ICD-10-CM | POA: Diagnosis not present

## 2023-11-12 DIAGNOSIS — R296 Repeated falls: Secondary | ICD-10-CM | POA: Diagnosis not present

## 2023-11-12 DIAGNOSIS — E7849 Other hyperlipidemia: Secondary | ICD-10-CM | POA: Diagnosis not present

## 2023-11-12 DIAGNOSIS — K2100 Gastro-esophageal reflux disease with esophagitis, without bleeding: Secondary | ICD-10-CM | POA: Diagnosis not present

## 2023-11-12 DIAGNOSIS — I10 Essential (primary) hypertension: Secondary | ICD-10-CM | POA: Diagnosis not present

## 2023-11-15 DIAGNOSIS — R634 Abnormal weight loss: Secondary | ICD-10-CM | POA: Diagnosis not present

## 2023-11-15 DIAGNOSIS — R296 Repeated falls: Secondary | ICD-10-CM | POA: Diagnosis not present

## 2023-11-15 DIAGNOSIS — E7849 Other hyperlipidemia: Secondary | ICD-10-CM | POA: Diagnosis not present

## 2023-11-15 DIAGNOSIS — I679 Cerebrovascular disease, unspecified: Secondary | ICD-10-CM | POA: Diagnosis not present

## 2023-11-15 DIAGNOSIS — I10 Essential (primary) hypertension: Secondary | ICD-10-CM | POA: Diagnosis not present

## 2023-11-15 DIAGNOSIS — K2100 Gastro-esophageal reflux disease with esophagitis, without bleeding: Secondary | ICD-10-CM | POA: Diagnosis not present

## 2023-11-16 DIAGNOSIS — I10 Essential (primary) hypertension: Secondary | ICD-10-CM | POA: Diagnosis not present

## 2023-11-16 DIAGNOSIS — R634 Abnormal weight loss: Secondary | ICD-10-CM | POA: Diagnosis not present

## 2023-11-16 DIAGNOSIS — I679 Cerebrovascular disease, unspecified: Secondary | ICD-10-CM | POA: Diagnosis not present

## 2023-11-16 DIAGNOSIS — E7849 Other hyperlipidemia: Secondary | ICD-10-CM | POA: Diagnosis not present

## 2023-11-16 DIAGNOSIS — K2100 Gastro-esophageal reflux disease with esophagitis, without bleeding: Secondary | ICD-10-CM | POA: Diagnosis not present

## 2023-11-16 DIAGNOSIS — R296 Repeated falls: Secondary | ICD-10-CM | POA: Diagnosis not present

## 2023-11-19 DIAGNOSIS — R634 Abnormal weight loss: Secondary | ICD-10-CM | POA: Diagnosis not present

## 2023-11-19 DIAGNOSIS — I10 Essential (primary) hypertension: Secondary | ICD-10-CM | POA: Diagnosis not present

## 2023-11-19 DIAGNOSIS — K2100 Gastro-esophageal reflux disease with esophagitis, without bleeding: Secondary | ICD-10-CM | POA: Diagnosis not present

## 2023-11-19 DIAGNOSIS — I679 Cerebrovascular disease, unspecified: Secondary | ICD-10-CM | POA: Diagnosis not present

## 2023-11-19 DIAGNOSIS — R296 Repeated falls: Secondary | ICD-10-CM | POA: Diagnosis not present

## 2023-11-19 DIAGNOSIS — E7849 Other hyperlipidemia: Secondary | ICD-10-CM | POA: Diagnosis not present

## 2023-11-23 DIAGNOSIS — R634 Abnormal weight loss: Secondary | ICD-10-CM | POA: Diagnosis not present

## 2023-11-23 DIAGNOSIS — R296 Repeated falls: Secondary | ICD-10-CM | POA: Diagnosis not present

## 2023-11-23 DIAGNOSIS — K2100 Gastro-esophageal reflux disease with esophagitis, without bleeding: Secondary | ICD-10-CM | POA: Diagnosis not present

## 2023-11-23 DIAGNOSIS — I10 Essential (primary) hypertension: Secondary | ICD-10-CM | POA: Diagnosis not present

## 2023-11-23 DIAGNOSIS — I679 Cerebrovascular disease, unspecified: Secondary | ICD-10-CM | POA: Diagnosis not present

## 2023-11-23 DIAGNOSIS — E7849 Other hyperlipidemia: Secondary | ICD-10-CM | POA: Diagnosis not present

## 2023-11-26 ENCOUNTER — Non-Acute Institutional Stay: Payer: Self-pay | Admitting: Nurse Practitioner

## 2023-11-26 ENCOUNTER — Encounter: Payer: Self-pay | Admitting: Nurse Practitioner

## 2023-11-26 DIAGNOSIS — G3184 Mild cognitive impairment, so stated: Secondary | ICD-10-CM | POA: Diagnosis not present

## 2023-11-26 DIAGNOSIS — K219 Gastro-esophageal reflux disease without esophagitis: Secondary | ICD-10-CM | POA: Diagnosis not present

## 2023-11-26 DIAGNOSIS — I679 Cerebrovascular disease, unspecified: Secondary | ICD-10-CM | POA: Diagnosis not present

## 2023-11-26 DIAGNOSIS — R634 Abnormal weight loss: Secondary | ICD-10-CM | POA: Diagnosis not present

## 2023-11-26 DIAGNOSIS — R1310 Dysphagia, unspecified: Secondary | ICD-10-CM | POA: Diagnosis not present

## 2023-11-26 DIAGNOSIS — R296 Repeated falls: Secondary | ICD-10-CM | POA: Diagnosis not present

## 2023-11-26 DIAGNOSIS — I10 Essential (primary) hypertension: Secondary | ICD-10-CM | POA: Diagnosis not present

## 2023-11-26 DIAGNOSIS — E7849 Other hyperlipidemia: Secondary | ICD-10-CM | POA: Diagnosis not present

## 2023-11-26 DIAGNOSIS — R627 Adult failure to thrive: Secondary | ICD-10-CM | POA: Diagnosis not present

## 2023-11-26 DIAGNOSIS — R471 Dysarthria and anarthria: Secondary | ICD-10-CM

## 2023-11-26 DIAGNOSIS — K2100 Gastro-esophageal reflux disease with esophagitis, without bleeding: Secondary | ICD-10-CM | POA: Diagnosis not present

## 2023-11-26 NOTE — Progress Notes (Signed)
 Location:   [facility] Room Number: 918 Place of Service:  ALF (13) Provider: Abner Hoffman Letonia Stead NP  Lonzie Robins, MD  Patient Care Team: Avva, Ravisankar, MD as PCP - General (Internal Medicine)  Extended Emergency Contact Information Primary Emergency Contact: Teer,Michael  [place] Home Phone: [phone] Mobile Phone: [phone] Relation: Nephew Secondary Emergency Contact: Benton,Mary  [place] Home Phone: [phone] Relation: Other  Code Status:  DNR Goals of care: Advanced Directive information    08/13/2023   11:30 AM  Advanced Directives  Does Patient Have a Medical Advance Directive? No;Yes  Type of Advance Directive Out of facility DNR (pink MOST or yellow form)  Does patient want to make changes to medical advance directive? No - Patient declined  Would patient like information on creating a medical advance directive? No - Patient declined  Pre-existing out of facility DNR order (yellow form or pink MOST form) Yellow form placed in chart (order not valid for inpatient use)     Chief Complaint  Patient presents with   Medical Management of Chronic Issues    HPI:  Pt is a 89 y.o. female seen today for medical management of chronic diseases.     Dysphagia, working with ST, declined diet modification             Weight loss/Adult failure to thrive, ongoing issue, under Hospice service.              HLD off statin             HTN, runs low, off meds.              Hx of CVA/Dysarthria, severe, on ASA             GERD, stable, off  Pantoprazole. Hgb 10.0 05/18/23             Cognitive impairment, MMSE 29/30 07/14/23             CKD Bun/creat 44/1.63 05/27/23   Past Medical History:  Diagnosis Date   Arthritis    Edema    bilateral ankles   History of CVA (cerebrovascular accident) 02/11/2023   02/11/23 MOSE DNR, limited interventions, ABT/IVF if indicated, no feeding tube.      History of urethral stricture     freq/ urge   HOH (hard of hearing)    both ears   Hypertension    elevates legs    Urethral stricture    Urinary retention    Wears hearing aid    Past Surgical History:  Procedure Laterality Date   CYSTO/ URETHRAL DILATION/ FULGERATION OF BLEEDER  10-16-10   CYSTOSCOPY WITH URETHRAL DILATATION  07/02/2011   Procedure: CYSTOSCOPY WITH URETHRAL DILATATION;  Surgeon: Willye Harvey;  Location: [facility];  Service: Urology;  Laterality: N/A;   CYSTOSCOPY WITH URETHRAL DILATATION N/A 08/24/2013   Procedure: CYSTOSCOPY WITH URETHRAL DILATATION;  Surgeon: Homero Luster, MD;  Location: [facility];  Service: Urology;  Laterality: N/A;   CYSTOSCOPY WITH URETHRAL DILATATION N/A 05/02/2015   Procedure: CYSTOSCOPY WITH URETHRAL DILATATION, FULGURATION BLADDER NECK;  Surgeon: Homero Luster, MD;  Location: [facility];  Service: Urology;  Laterality: N/A;   CYSTOSCOPY WITH URETHRAL DILATATION N/A 08/03/2017   Procedure: CYSTOSCOPY WITH URETHRAL DILATATION;  Surgeon: Homero Luster, MD;  Location: [facility];  Service: Urology;  Laterality: N/A;   ORIF ORBITAL FRACTURE Right 07/29/2015   Procedure: RIGHT ORBITAL  FLOOR REPAIR ;  Surgeon: Virgina Grills, MD;  Location: [facility];  Service: ENT;  Laterality: Right;    Allergies  Allergen Reactions   Furosemide Other (See Comments)    Excessive Diarrhea.    Allergies as of 11/26/2023       Reactions   Furosemide Other (See Comments)   Excessive Diarrhea.        Medication List        Accurate as of November 26, 2023 11:59 PM. If you have any questions, ask your nurse or doctor.          acetaminophen 325 MG tablet Commonly known as: TYLENOL Take 325 mg by mouth every 6 (six) hours as needed for headache.   aspirin 81 MG tablet Take 81 mg by mouth at bedtime.   Calcium Citrate + 315-5 MG-MCG Tabs Generic drug: Calcium Citrate-Vitamin D Take 1 tablet by mouth daily.    Cholecalciferol 50 MCG (2000 UT) Tabs Take 1 tablet by mouth daily.   clopidogrel 75 MG tablet Commonly known as: PLAVIX Take 75 mg by mouth daily.   diclofenac sodium 1 % Gel Commonly known as: VOLTAREN Apply topically as needed.   multivitamin with minerals Tabs tablet Take 1 tablet by mouth daily.   olmesartan-hydrochlorothiazide 20-12.5 MG tablet Commonly known as: BENICAR HCT Take 1 tablet by mouth daily.   pantoprazole 40 MG tablet Commonly known as: PROTONIX Take 40 mg by mouth daily.   simvastatin 20 MG tablet Commonly known as: ZOCOR Take 20 mg by mouth at bedtime.        Review of Systems  Constitutional:  Negative for appetite change, fatigue and fever.  HENT:  Positive for trouble swallowing. Negative for congestion.   Respiratory:  Negative for cough, shortness of breath and wheezing.   Cardiovascular:  Negative for leg swelling.  Gastrointestinal:  Negative for abdominal pain.  Genitourinary:  Negative for dysuria and urgency.  Musculoskeletal:  Positive for arthralgias and gait problem.  Skin:  Negative for color change.  Neurological:  Positive for speech difficulty. Negative for syncope and headaches.  Psychiatric/Behavioral:  Negative for behavioral problems. The patient is not nervous/anxious.     Immunization History  Administered Date(s) Administered   DTaP 02/20/2008   Influenza, High Dose Seasonal PF 05/19/2023   Influenza-Unspecified 05/17/2010   Pneumococcal-Unspecified 12/15/2005   Tdap 07/15/2015   Pertinent  Health Maintenance Due  Topic Date Due   DEXA SCAN  Never done   INFLUENZA VACCINE  03/17/2024      12/24/2020   10:29 AM  Fall Risk  (RETIRED) Patient Fall Risk Level High fall risk   Functional Status Survey:    Vitals:   11/26/23 1635  BP: 121/65  Pulse: 76  Resp: 18  Temp: (!) 97.3 F (36.3 C)  SpO2: 98%   There is no height or weight on file to calculate BMI. Physical Exam Vitals reviewed.   Constitutional:      Appearance: Normal appearance.     Comments: Continue weight loss  HENT:     Head: Normocephalic and atraumatic.     Nose: Nose normal.     Mouth/Throat:     Mouth: Mucous membranes are moist.  Eyes:     Extraocular Movements: Extraocular movements intact.     Conjunctiva/sclera: Conjunctivae normal.     Pupils: Pupils are equal, round, and reactive to light.  Cardiovascular:     Rate and Rhythm: Normal rate and regular rhythm.  Heart sounds: No murmur heard. Pulmonary:     Effort: Pulmonary effort is normal.     Breath sounds: No rales.  Abdominal:     General: Bowel sounds are normal.     Palpations: Abdomen is soft.     Tenderness: There is no abdominal tenderness.  Musculoskeletal:     Cervical back: Normal range of motion and neck supple.     Right lower leg: No edema.     Left lower leg: No edema.  Skin:    General: Skin is warm and dry.  Neurological:     General: No focal deficit present.     Mental Status: She is alert. Mental status is at baseline.     Gait: Gait abnormal.     Comments: Oriented to person, uses power w/c to get around.   Psychiatric:        Mood and Affect: Mood normal.        Behavior: Behavior normal.     Labs reviewed: Recent Labs    03/30/23 0000 04/30/23 0000 06/11/23 0000  NA 134* 136* 137  K 4.1 3.5 4.6  CL 97* 102 102  CO2 28* 23* 29*  BUN 37* 70* 35*  CREATININE 1.5* 4.0* 1.4*  CALCIUM 9.5 8.8 9.0   Recent Labs    03/30/23 0000 04/30/23 0000  AST 14 11*  ALT 13 6*  ALKPHOS 58 51  ALBUMIN 4.0 3.2*   Recent Labs    03/30/23 0000 04/30/23 0000  WBC 6.2 5.7  NEUTROABS 4,644.00 3,819.00  HGB 11.2* 9.6*  HCT 34* 29*  PLT 322 261   Lab Results  Component Value Date   TSH 1.67 04/30/2023   No results found for: "HGBA1C" Lab Results  Component Value Date   CHOL 141 03/30/2023   HDL 49 03/30/2023   LDLCALC 75 03/30/2023   TRIG 85 03/30/2023    Significant Diagnostic Results in last  30 days:  No results found.  Assessment/Plan  Adult failure to thrive ongoing issue, under Hospice service.   Hypertension Runs low, off meds.   Dysphagia  declined diet modification, comfort measures.   Dysarthria and anarthria Hx of CVA/Dysarthria, severe, on ASA, communication board   GERD (gastroesophageal reflux disease) stable, off  Pantoprazole. Hgb 10.0 05/18/23   Family/ staff Communication: plan of care reviewed with the patient and charge nurse.   Labs/tests ordered: none

## 2023-11-29 NOTE — Assessment & Plan Note (Signed)
 declined diet modification, comfort measures.

## 2023-11-29 NOTE — Assessment & Plan Note (Signed)
 MMSE 29/30 07/14/23

## 2023-11-29 NOTE — Assessment & Plan Note (Signed)
 ongoing issue, under Hospice service.

## 2023-11-29 NOTE — Assessment & Plan Note (Signed)
Runs low, off meds.

## 2023-11-29 NOTE — Assessment & Plan Note (Signed)
 stable, off  Pantoprazole. Hgb 10.0 05/18/23

## 2023-11-29 NOTE — Assessment & Plan Note (Signed)
 Hx of CVA/Dysarthria, severe, on ASA, communication board

## 2023-11-30 DIAGNOSIS — E7849 Other hyperlipidemia: Secondary | ICD-10-CM | POA: Diagnosis not present

## 2023-11-30 DIAGNOSIS — K2100 Gastro-esophageal reflux disease with esophagitis, without bleeding: Secondary | ICD-10-CM | POA: Diagnosis not present

## 2023-11-30 DIAGNOSIS — R634 Abnormal weight loss: Secondary | ICD-10-CM | POA: Diagnosis not present

## 2023-11-30 DIAGNOSIS — I679 Cerebrovascular disease, unspecified: Secondary | ICD-10-CM | POA: Diagnosis not present

## 2023-11-30 DIAGNOSIS — R296 Repeated falls: Secondary | ICD-10-CM | POA: Diagnosis not present

## 2023-11-30 DIAGNOSIS — I10 Essential (primary) hypertension: Secondary | ICD-10-CM | POA: Diagnosis not present

## 2023-12-03 DIAGNOSIS — I10 Essential (primary) hypertension: Secondary | ICD-10-CM | POA: Diagnosis not present

## 2023-12-03 DIAGNOSIS — I679 Cerebrovascular disease, unspecified: Secondary | ICD-10-CM | POA: Diagnosis not present

## 2023-12-03 DIAGNOSIS — R296 Repeated falls: Secondary | ICD-10-CM | POA: Diagnosis not present

## 2023-12-03 DIAGNOSIS — R634 Abnormal weight loss: Secondary | ICD-10-CM | POA: Diagnosis not present

## 2023-12-03 DIAGNOSIS — E7849 Other hyperlipidemia: Secondary | ICD-10-CM | POA: Diagnosis not present

## 2023-12-03 DIAGNOSIS — K2100 Gastro-esophageal reflux disease with esophagitis, without bleeding: Secondary | ICD-10-CM | POA: Diagnosis not present

## 2023-12-07 DIAGNOSIS — I10 Essential (primary) hypertension: Secondary | ICD-10-CM | POA: Diagnosis not present

## 2023-12-07 DIAGNOSIS — I679 Cerebrovascular disease, unspecified: Secondary | ICD-10-CM | POA: Diagnosis not present

## 2023-12-07 DIAGNOSIS — R634 Abnormal weight loss: Secondary | ICD-10-CM | POA: Diagnosis not present

## 2023-12-07 DIAGNOSIS — R296 Repeated falls: Secondary | ICD-10-CM | POA: Diagnosis not present

## 2023-12-07 DIAGNOSIS — E7849 Other hyperlipidemia: Secondary | ICD-10-CM | POA: Diagnosis not present

## 2023-12-07 DIAGNOSIS — K2100 Gastro-esophageal reflux disease with esophagitis, without bleeding: Secondary | ICD-10-CM | POA: Diagnosis not present

## 2023-12-10 DIAGNOSIS — I679 Cerebrovascular disease, unspecified: Secondary | ICD-10-CM | POA: Diagnosis not present

## 2023-12-10 DIAGNOSIS — K2100 Gastro-esophageal reflux disease with esophagitis, without bleeding: Secondary | ICD-10-CM | POA: Diagnosis not present

## 2023-12-10 DIAGNOSIS — R634 Abnormal weight loss: Secondary | ICD-10-CM | POA: Diagnosis not present

## 2023-12-10 DIAGNOSIS — R296 Repeated falls: Secondary | ICD-10-CM | POA: Diagnosis not present

## 2023-12-10 DIAGNOSIS — E7849 Other hyperlipidemia: Secondary | ICD-10-CM | POA: Diagnosis not present

## 2023-12-10 DIAGNOSIS — I10 Essential (primary) hypertension: Secondary | ICD-10-CM | POA: Diagnosis not present

## 2023-12-13 DIAGNOSIS — E7849 Other hyperlipidemia: Secondary | ICD-10-CM | POA: Diagnosis not present

## 2023-12-13 DIAGNOSIS — R634 Abnormal weight loss: Secondary | ICD-10-CM | POA: Diagnosis not present

## 2023-12-13 DIAGNOSIS — I679 Cerebrovascular disease, unspecified: Secondary | ICD-10-CM | POA: Diagnosis not present

## 2023-12-13 DIAGNOSIS — K2100 Gastro-esophageal reflux disease with esophagitis, without bleeding: Secondary | ICD-10-CM | POA: Diagnosis not present

## 2023-12-13 DIAGNOSIS — I10 Essential (primary) hypertension: Secondary | ICD-10-CM | POA: Diagnosis not present

## 2023-12-13 DIAGNOSIS — R296 Repeated falls: Secondary | ICD-10-CM | POA: Diagnosis not present

## 2023-12-16 DIAGNOSIS — I679 Cerebrovascular disease, unspecified: Secondary | ICD-10-CM | POA: Diagnosis not present

## 2023-12-16 DIAGNOSIS — R634 Abnormal weight loss: Secondary | ICD-10-CM | POA: Diagnosis not present

## 2023-12-16 DIAGNOSIS — K2100 Gastro-esophageal reflux disease with esophagitis, without bleeding: Secondary | ICD-10-CM | POA: Diagnosis not present

## 2023-12-16 DIAGNOSIS — E7849 Other hyperlipidemia: Secondary | ICD-10-CM | POA: Diagnosis not present

## 2023-12-16 DIAGNOSIS — R296 Repeated falls: Secondary | ICD-10-CM | POA: Diagnosis not present

## 2023-12-16 DIAGNOSIS — I10 Essential (primary) hypertension: Secondary | ICD-10-CM | POA: Diagnosis not present

## 2023-12-20 DIAGNOSIS — K2100 Gastro-esophageal reflux disease with esophagitis, without bleeding: Secondary | ICD-10-CM | POA: Diagnosis not present

## 2023-12-20 DIAGNOSIS — R634 Abnormal weight loss: Secondary | ICD-10-CM | POA: Diagnosis not present

## 2023-12-20 DIAGNOSIS — R296 Repeated falls: Secondary | ICD-10-CM | POA: Diagnosis not present

## 2023-12-20 DIAGNOSIS — I10 Essential (primary) hypertension: Secondary | ICD-10-CM | POA: Diagnosis not present

## 2023-12-20 DIAGNOSIS — I679 Cerebrovascular disease, unspecified: Secondary | ICD-10-CM | POA: Diagnosis not present

## 2023-12-20 DIAGNOSIS — E7849 Other hyperlipidemia: Secondary | ICD-10-CM | POA: Diagnosis not present

## 2023-12-23 DIAGNOSIS — R296 Repeated falls: Secondary | ICD-10-CM | POA: Diagnosis not present

## 2023-12-23 DIAGNOSIS — E7849 Other hyperlipidemia: Secondary | ICD-10-CM | POA: Diagnosis not present

## 2023-12-23 DIAGNOSIS — R634 Abnormal weight loss: Secondary | ICD-10-CM | POA: Diagnosis not present

## 2023-12-23 DIAGNOSIS — I10 Essential (primary) hypertension: Secondary | ICD-10-CM | POA: Diagnosis not present

## 2023-12-23 DIAGNOSIS — K2100 Gastro-esophageal reflux disease with esophagitis, without bleeding: Secondary | ICD-10-CM | POA: Diagnosis not present

## 2023-12-23 DIAGNOSIS — I679 Cerebrovascular disease, unspecified: Secondary | ICD-10-CM | POA: Diagnosis not present

## 2023-12-27 DIAGNOSIS — R296 Repeated falls: Secondary | ICD-10-CM | POA: Diagnosis not present

## 2023-12-27 DIAGNOSIS — I10 Essential (primary) hypertension: Secondary | ICD-10-CM | POA: Diagnosis not present

## 2023-12-27 DIAGNOSIS — R634 Abnormal weight loss: Secondary | ICD-10-CM | POA: Diagnosis not present

## 2023-12-27 DIAGNOSIS — I679 Cerebrovascular disease, unspecified: Secondary | ICD-10-CM | POA: Diagnosis not present

## 2023-12-27 DIAGNOSIS — E7849 Other hyperlipidemia: Secondary | ICD-10-CM | POA: Diagnosis not present

## 2023-12-27 DIAGNOSIS — K2100 Gastro-esophageal reflux disease with esophagitis, without bleeding: Secondary | ICD-10-CM | POA: Diagnosis not present

## 2023-12-30 DIAGNOSIS — R634 Abnormal weight loss: Secondary | ICD-10-CM | POA: Diagnosis not present

## 2023-12-30 DIAGNOSIS — I679 Cerebrovascular disease, unspecified: Secondary | ICD-10-CM | POA: Diagnosis not present

## 2023-12-30 DIAGNOSIS — E7849 Other hyperlipidemia: Secondary | ICD-10-CM | POA: Diagnosis not present

## 2023-12-30 DIAGNOSIS — R296 Repeated falls: Secondary | ICD-10-CM | POA: Diagnosis not present

## 2023-12-30 DIAGNOSIS — I10 Essential (primary) hypertension: Secondary | ICD-10-CM | POA: Diagnosis not present

## 2023-12-30 DIAGNOSIS — K2100 Gastro-esophageal reflux disease with esophagitis, without bleeding: Secondary | ICD-10-CM | POA: Diagnosis not present

## 2024-01-03 DIAGNOSIS — K2100 Gastro-esophageal reflux disease with esophagitis, without bleeding: Secondary | ICD-10-CM | POA: Diagnosis not present

## 2024-01-03 DIAGNOSIS — R296 Repeated falls: Secondary | ICD-10-CM | POA: Diagnosis not present

## 2024-01-03 DIAGNOSIS — E7849 Other hyperlipidemia: Secondary | ICD-10-CM | POA: Diagnosis not present

## 2024-01-03 DIAGNOSIS — R634 Abnormal weight loss: Secondary | ICD-10-CM | POA: Diagnosis not present

## 2024-01-03 DIAGNOSIS — I679 Cerebrovascular disease, unspecified: Secondary | ICD-10-CM | POA: Diagnosis not present

## 2024-01-03 DIAGNOSIS — I10 Essential (primary) hypertension: Secondary | ICD-10-CM | POA: Diagnosis not present

## 2024-01-06 DIAGNOSIS — I10 Essential (primary) hypertension: Secondary | ICD-10-CM | POA: Diagnosis not present

## 2024-01-06 DIAGNOSIS — R296 Repeated falls: Secondary | ICD-10-CM | POA: Diagnosis not present

## 2024-01-06 DIAGNOSIS — K2100 Gastro-esophageal reflux disease with esophagitis, without bleeding: Secondary | ICD-10-CM | POA: Diagnosis not present

## 2024-01-06 DIAGNOSIS — I679 Cerebrovascular disease, unspecified: Secondary | ICD-10-CM | POA: Diagnosis not present

## 2024-01-06 DIAGNOSIS — R634 Abnormal weight loss: Secondary | ICD-10-CM | POA: Diagnosis not present

## 2024-01-06 DIAGNOSIS — E7849 Other hyperlipidemia: Secondary | ICD-10-CM | POA: Diagnosis not present

## 2024-01-13 DIAGNOSIS — I679 Cerebrovascular disease, unspecified: Secondary | ICD-10-CM | POA: Diagnosis not present

## 2024-01-13 DIAGNOSIS — R296 Repeated falls: Secondary | ICD-10-CM | POA: Diagnosis not present

## 2024-01-13 DIAGNOSIS — R634 Abnormal weight loss: Secondary | ICD-10-CM | POA: Diagnosis not present

## 2024-01-13 DIAGNOSIS — K2100 Gastro-esophageal reflux disease with esophagitis, without bleeding: Secondary | ICD-10-CM | POA: Diagnosis not present

## 2024-01-13 DIAGNOSIS — I10 Essential (primary) hypertension: Secondary | ICD-10-CM | POA: Diagnosis not present

## 2024-01-13 DIAGNOSIS — E7849 Other hyperlipidemia: Secondary | ICD-10-CM | POA: Diagnosis not present

## 2024-01-14 DIAGNOSIS — R634 Abnormal weight loss: Secondary | ICD-10-CM | POA: Diagnosis not present

## 2024-01-14 DIAGNOSIS — K2100 Gastro-esophageal reflux disease with esophagitis, without bleeding: Secondary | ICD-10-CM | POA: Diagnosis not present

## 2024-01-14 DIAGNOSIS — I10 Essential (primary) hypertension: Secondary | ICD-10-CM | POA: Diagnosis not present

## 2024-01-14 DIAGNOSIS — R296 Repeated falls: Secondary | ICD-10-CM | POA: Diagnosis not present

## 2024-01-14 DIAGNOSIS — I679 Cerebrovascular disease, unspecified: Secondary | ICD-10-CM | POA: Diagnosis not present

## 2024-01-14 DIAGNOSIS — E7849 Other hyperlipidemia: Secondary | ICD-10-CM | POA: Diagnosis not present

## 2024-01-16 DIAGNOSIS — K2100 Gastro-esophageal reflux disease with esophagitis, without bleeding: Secondary | ICD-10-CM | POA: Diagnosis not present

## 2024-01-16 DIAGNOSIS — E7849 Other hyperlipidemia: Secondary | ICD-10-CM | POA: Diagnosis not present

## 2024-01-16 DIAGNOSIS — I679 Cerebrovascular disease, unspecified: Secondary | ICD-10-CM | POA: Diagnosis not present

## 2024-01-16 DIAGNOSIS — R296 Repeated falls: Secondary | ICD-10-CM | POA: Diagnosis not present

## 2024-01-16 DIAGNOSIS — I10 Essential (primary) hypertension: Secondary | ICD-10-CM | POA: Diagnosis not present

## 2024-01-16 DIAGNOSIS — R634 Abnormal weight loss: Secondary | ICD-10-CM | POA: Diagnosis not present

## 2024-01-19 DIAGNOSIS — I679 Cerebrovascular disease, unspecified: Secondary | ICD-10-CM | POA: Diagnosis not present

## 2024-01-19 DIAGNOSIS — R634 Abnormal weight loss: Secondary | ICD-10-CM | POA: Diagnosis not present

## 2024-01-19 DIAGNOSIS — K2100 Gastro-esophageal reflux disease with esophagitis, without bleeding: Secondary | ICD-10-CM | POA: Diagnosis not present

## 2024-01-19 DIAGNOSIS — I10 Essential (primary) hypertension: Secondary | ICD-10-CM | POA: Diagnosis not present

## 2024-01-19 DIAGNOSIS — E7849 Other hyperlipidemia: Secondary | ICD-10-CM | POA: Diagnosis not present

## 2024-01-19 DIAGNOSIS — R296 Repeated falls: Secondary | ICD-10-CM | POA: Diagnosis not present

## 2024-01-20 DIAGNOSIS — K2100 Gastro-esophageal reflux disease with esophagitis, without bleeding: Secondary | ICD-10-CM | POA: Diagnosis not present

## 2024-01-20 DIAGNOSIS — R634 Abnormal weight loss: Secondary | ICD-10-CM | POA: Diagnosis not present

## 2024-01-20 DIAGNOSIS — R296 Repeated falls: Secondary | ICD-10-CM | POA: Diagnosis not present

## 2024-01-20 DIAGNOSIS — I679 Cerebrovascular disease, unspecified: Secondary | ICD-10-CM | POA: Diagnosis not present

## 2024-01-20 DIAGNOSIS — I10 Essential (primary) hypertension: Secondary | ICD-10-CM | POA: Diagnosis not present

## 2024-01-20 DIAGNOSIS — E7849 Other hyperlipidemia: Secondary | ICD-10-CM | POA: Diagnosis not present

## 2024-01-21 DIAGNOSIS — R296 Repeated falls: Secondary | ICD-10-CM | POA: Diagnosis not present

## 2024-01-21 DIAGNOSIS — I10 Essential (primary) hypertension: Secondary | ICD-10-CM | POA: Diagnosis not present

## 2024-01-21 DIAGNOSIS — I679 Cerebrovascular disease, unspecified: Secondary | ICD-10-CM | POA: Diagnosis not present

## 2024-01-21 DIAGNOSIS — E7849 Other hyperlipidemia: Secondary | ICD-10-CM | POA: Diagnosis not present

## 2024-01-21 DIAGNOSIS — R634 Abnormal weight loss: Secondary | ICD-10-CM | POA: Diagnosis not present

## 2024-01-21 DIAGNOSIS — K2100 Gastro-esophageal reflux disease with esophagitis, without bleeding: Secondary | ICD-10-CM | POA: Diagnosis not present

## 2024-01-23 ENCOUNTER — Telehealth: Payer: Self-pay | Admitting: Family

## 2024-01-23 DIAGNOSIS — R634 Abnormal weight loss: Secondary | ICD-10-CM | POA: Diagnosis not present

## 2024-01-23 DIAGNOSIS — I10 Essential (primary) hypertension: Secondary | ICD-10-CM | POA: Diagnosis not present

## 2024-01-23 DIAGNOSIS — E7849 Other hyperlipidemia: Secondary | ICD-10-CM | POA: Diagnosis not present

## 2024-01-23 DIAGNOSIS — I679 Cerebrovascular disease, unspecified: Secondary | ICD-10-CM | POA: Diagnosis not present

## 2024-01-23 DIAGNOSIS — R296 Repeated falls: Secondary | ICD-10-CM | POA: Diagnosis not present

## 2024-01-23 DIAGNOSIS — K2100 Gastro-esophageal reflux disease with esophagitis, without bleeding: Secondary | ICD-10-CM | POA: Diagnosis not present

## 2024-01-23 NOTE — Telephone Encounter (Signed)
 Facility Nurse called reports patient's oxygen sats low unable to get above 85%,B/p 180/92 and HR 107 b/min patient in distress.current on Hospice service.Patient also DNR.Advised to give Roxanol 10 mg /ml solution administer 5 mg SL every 4 hrs as needed for shortness of breath or restlessness. Also notify Hospice service patient's condition.

## 2024-01-24 ENCOUNTER — Encounter: Payer: Self-pay | Admitting: Nurse Practitioner

## 2024-01-24 ENCOUNTER — Non-Acute Institutional Stay: Payer: Self-pay | Admitting: Nurse Practitioner

## 2024-01-24 DIAGNOSIS — K2100 Gastro-esophageal reflux disease with esophagitis, without bleeding: Secondary | ICD-10-CM | POA: Diagnosis not present

## 2024-01-24 DIAGNOSIS — I10 Essential (primary) hypertension: Secondary | ICD-10-CM

## 2024-01-24 DIAGNOSIS — I679 Cerebrovascular disease, unspecified: Secondary | ICD-10-CM | POA: Diagnosis not present

## 2024-01-24 DIAGNOSIS — R1310 Dysphagia, unspecified: Secondary | ICD-10-CM

## 2024-01-24 DIAGNOSIS — F0390 Unspecified dementia without behavioral disturbance: Secondary | ICD-10-CM

## 2024-01-24 DIAGNOSIS — R634 Abnormal weight loss: Secondary | ICD-10-CM | POA: Diagnosis not present

## 2024-01-24 DIAGNOSIS — K219 Gastro-esophageal reflux disease without esophagitis: Secondary | ICD-10-CM

## 2024-01-24 DIAGNOSIS — R627 Adult failure to thrive: Secondary | ICD-10-CM

## 2024-01-24 DIAGNOSIS — E7849 Other hyperlipidemia: Secondary | ICD-10-CM | POA: Diagnosis not present

## 2024-01-24 DIAGNOSIS — R296 Repeated falls: Secondary | ICD-10-CM | POA: Diagnosis not present

## 2024-01-24 DIAGNOSIS — J9601 Acute respiratory failure with hypoxia: Secondary | ICD-10-CM | POA: Diagnosis not present

## 2024-01-24 DIAGNOSIS — J9690 Respiratory failure, unspecified, unspecified whether with hypoxia or hypercapnia: Secondary | ICD-10-CM | POA: Insufficient documentation

## 2024-01-24 NOTE — Progress Notes (Signed)
 Location:   [facility] Room Number: 918 Place of Service:  ALF (13) Provider: Abner Hoffman Cadence Minton NP  Lonzie Robins, MD  Patient Care Team: Avva, Ravisankar, MD as PCP - General (Internal Medicine)  Extended Emergency Contact Information Primary Emergency Contact: Teer,Michael  [place] Home Phone: [phone] Mobile Phone: [phone] Relation: Nephew Secondary Emergency Contact: Michaelene Admire  [place] Home Phone: [phone] Relation: Other  Code Status: DNR Goals of care: Advanced Directive information    08/13/2023   11:30 AM  Advanced Directives  Does Patient Have a Medical Advance Directive? No;Yes  Type of Advance Directive Out of facility DNR (pink MOST or yellow form)  Does patient want to make changes to medical advance directive? No - Patient declined  Would patient like information on creating a medical advance directive? No - Patient declined  Pre-existing out of facility DNR order (yellow form or pink MOST form) Yellow form placed in chart (order not valid for inpatient use)     Chief Complaint  Patient presents with   Acute Visit    Respiratory distress    HPI:  Pt is a 89 y.o. female seen today for an acute visit for episodes of SOB, rapid breathing, Sat O2 desaturation, Morphine  is available to her. Her gaol of care is comfort measures, under Hospice service, HPOA declined CXR and labs.     Dysphagia, declined diet modification, risk for aspiration.              Weight loss/Adult failure to thrive, ongoing issue, under Hospice service.              HTN, intermittent elevated Sbp, loose Bp control, off meds.              Hx of CVA/Dysarthria, severe, on ASA             GERD, stable, off  Pantoprazole.              Cognitive impairment, MMSE 29/30 07/14/23               Past Medical History:  Diagnosis Date   Arthritis    Edema    bilateral ankles   History of CVA (cerebrovascular accident) 02/11/2023    02/11/23 MOSE DNR, limited interventions, ABT/IVF if indicated, no feeding tube.      History of urethral stricture    freq/ urge   HOH (hard of hearing)    both ears   Hypertension    elevates legs    Urethral stricture    Urinary retention    Wears hearing aid    Past Surgical History:  Procedure Laterality Date   CYSTO/ URETHRAL DILATION/ FULGERATION OF BLEEDER  10-16-10   CYSTOSCOPY WITH URETHRAL DILATATION  07/02/2011   Procedure: CYSTOSCOPY WITH URETHRAL DILATATION;  Surgeon: Willye Harvey;  Location: [facility];  Service: Urology;  Laterality: N/A;   CYSTOSCOPY WITH URETHRAL DILATATION N/A 08/24/2013   Procedure: CYSTOSCOPY WITH URETHRAL DILATATION;  Surgeon: Homero Luster, MD;  Location: [facility];  Service: Urology;  Laterality: N/A;   CYSTOSCOPY WITH URETHRAL DILATATION N/A 05/02/2015   Procedure: CYSTOSCOPY WITH URETHRAL DILATATION, FULGURATION BLADDER NECK;  Surgeon: Homero Luster, MD;  Location: [facility];  Service: Urology;  Laterality: N/A;   CYSTOSCOPY WITH URETHRAL DILATATION N/A 08/03/2017   Procedure: CYSTOSCOPY WITH URETHRAL DILATATION;  Surgeon: Homero Luster, MD;  Location: [facility];  Service: Urology;  Laterality: N/A;  ORIF ORBITAL FRACTURE Right 07/29/2015   Procedure: RIGHT ORBITAL FLOOR REPAIR ;  Surgeon: Virgina Grills, MD;  Location: [facility];  Service: ENT;  Laterality: Right;    Allergies  Allergen Reactions   Furosemide Other (See Comments)    Excessive Diarrhea.    Allergies as of 01/24/2024       Reactions   Furosemide Other (See Comments)   Excessive Diarrhea.        Medication List        Accurate as of January 24, 2024  1:59 PM. If you have any questions, ask your nurse or doctor.          acetaminophen  325 MG tablet Commonly known as: TYLENOL  Take 325 mg by mouth every 6 (six) hours as needed for headache.   aspirin 81 MG tablet Take 81 mg by mouth at  bedtime.   Calcium Citrate + 315-5 MG-MCG Tabs Generic drug: Calcium Citrate-Vitamin D  Take 1 tablet by mouth daily.   Cholecalciferol 50 MCG (2000 UT) Tabs Take 1 tablet by mouth daily.   clopidogrel 75 MG tablet Commonly known as: PLAVIX Take 75 mg by mouth daily.   diclofenac sodium 1 % Gel Commonly known as: VOLTAREN Apply topically as needed.   multivitamin with minerals Tabs tablet Take 1 tablet by mouth daily.   olmesartan-hydrochlorothiazide  20-12.5 MG tablet Commonly known as: BENICAR HCT Take 1 tablet by mouth daily.   pantoprazole 40 MG tablet Commonly known as: PROTONIX Take 40 mg by mouth daily.   simvastatin  20 MG tablet Commonly known as: ZOCOR  Take 20 mg by mouth at bedtime.        Review of Systems  Constitutional:  Positive for appetite change and fatigue. Negative for fever.  HENT:  Positive for trouble swallowing. Negative for congestion.   Respiratory:  Positive for apnea, cough and shortness of breath. Negative for wheezing.   Cardiovascular:  Negative for leg swelling.  Gastrointestinal:  Negative for abdominal pain.  Genitourinary:  Negative for dysuria and urgency.  Musculoskeletal:  Positive for arthralgias and gait problem.  Skin:  Negative for color change.  Neurological:  Positive for speech difficulty. Negative for syncope and headaches.  Psychiatric/Behavioral:  Negative for behavioral problems. The patient is not nervous/anxious.     Immunization History  Administered Date(s) Administered   DTaP 02/20/2008   Influenza, High Dose Seasonal PF 05/19/2023   Influenza-Unspecified 05/17/2010   Pneumococcal-Unspecified 12/15/2005   Tdap 07/15/2015   Pertinent  Health Maintenance Due  Topic Date Due   DEXA SCAN  Never done   INFLUENZA VACCINE  03/17/2024      12/24/2020   10:29 AM  Fall Risk  (RETIRED) Patient Fall Risk Level High fall risk   Functional Status Survey:    Vitals:   01/24/24 1339  BP: (!) 151/72  Pulse: 64   Resp: 20  Temp: 97.6 F (36.4 C)  SpO2: 92%   There is no height or weight on file to calculate BMI. Physical Exam Vitals reviewed.  Constitutional:      Appearance: She is ill-appearing.     Comments: Continue weight loss  HENT:     Head: Normocephalic and atraumatic.     Nose: Nose normal.     Mouth/Throat:     Mouth: Mucous membranes are moist.  Eyes:     Extraocular Movements: Extraocular movements intact.     Conjunctiva/sclera: Conjunctivae normal.     Pupils: Pupils are equal, round, and reactive to light.  Cardiovascular:     Rate and Rhythm: Normal rate and regular rhythm.     Heart sounds: No murmur heard. Pulmonary:     Effort: Pulmonary effort is normal.     Breath sounds: Rhonchi present. No wheezing.     Comments: Central congestion, scattered rhonchi Abdominal:     General: Bowel sounds are normal.     Palpations: Abdomen is soft.     Tenderness: There is no abdominal tenderness.  Musculoskeletal:     Cervical back: Normal range of motion and neck supple.     Right lower leg: No edema.     Left lower leg: No edema.  Skin:    General: Skin is warm and dry.  Neurological:     General: No focal deficit present.     Mental Status: She is alert. Mental status is at baseline.     Gait: Gait abnormal.     Comments: Oriented to person, uses power w/c to get around.   Psychiatric:        Mood and Affect: Mood normal.        Behavior: Behavior normal.     Labs reviewed: Recent Labs    03/30/23 0000 04/30/23 0000 06/11/23 0000  NA 134* 136* 137  K 4.1 3.5 4.6  CL 97* 102 102  CO2 28* 23* 29*  BUN 37* 70* 35*  CREATININE 1.5* 4.0* 1.4*  CALCIUM 9.5 8.8 9.0   Recent Labs    03/30/23 0000 04/30/23 0000  AST 14 11*  ALT 13 6*  ALKPHOS 58 51  ALBUMIN 4.0 3.2*   Recent Labs    03/30/23 0000 04/30/23 0000  WBC 6.2 5.7  NEUTROABS 4,644.00 3,819.00  HGB 11.2* 9.6*  HCT 34* 29*  PLT 322 261   Lab Results  Component Value Date   TSH 1.67  04/30/2023   No results found for: "HGBA1C" Lab Results  Component Value Date   CHOL 141 03/30/2023   HDL 49 03/30/2023   LDLCALC 75 03/30/2023   TRIG 85 03/30/2023    Significant Diagnostic Results in last 30 days:  No results found.  Assessment/Plan: Respiratory failure (HCC) episodes of SOB, rapid breathing, Sat O2 desaturation, Morphine  is available to her. Her gaol of care is comfort measures, under Hospice service, HPOA declined CXR and labs.  Continue O2 3lpm via [place], prn Morphine , adding Atropine sol 2gtt SL q4hr prn.   Dysphagia declined diet modification, risk for aspiration.   Adult failure to thrive  ongoing issue, under Hospice service.   Hypertension  intermittent elevated Sbp, loose Bp control, off meds.   GERD (gastroesophageal reflux disease)  stable, off  Pantoprazole.   Major neurocognitive disorder [facility]) Supportive care    Family/ staff Communication: plan of care reviewed with the patient, hospice nurse, and charge nurse.   Labs/tests ordered:  none

## 2024-01-24 NOTE — Assessment & Plan Note (Signed)
stable, off Pantoprazole. 

## 2024-01-24 NOTE — Assessment & Plan Note (Signed)
 Supportive care

## 2024-01-24 NOTE — Assessment & Plan Note (Signed)
 episodes of SOB, rapid breathing, Sat O2 desaturation, Morphine  is available to her. Her gaol of care is comfort measures, under Hospice service, HPOA declined CXR and labs.  Continue O2 3lpm via [place], prn Morphine , adding Atropine sol 2gtt SL q4hr prn.

## 2024-01-24 NOTE — Assessment & Plan Note (Signed)
 ongoing issue, under Hospice service.

## 2024-01-24 NOTE — Assessment & Plan Note (Signed)
 intermittent elevated Sbp, loose Bp control, off meds.

## 2024-01-24 NOTE — Assessment & Plan Note (Signed)
 declined diet modification, risk for aspiration.

## 2024-01-25 DIAGNOSIS — I679 Cerebrovascular disease, unspecified: Secondary | ICD-10-CM | POA: Diagnosis not present

## 2024-01-25 DIAGNOSIS — E7849 Other hyperlipidemia: Secondary | ICD-10-CM | POA: Diagnosis not present

## 2024-01-25 DIAGNOSIS — K2100 Gastro-esophageal reflux disease with esophagitis, without bleeding: Secondary | ICD-10-CM | POA: Diagnosis not present

## 2024-01-25 DIAGNOSIS — R296 Repeated falls: Secondary | ICD-10-CM | POA: Diagnosis not present

## 2024-01-25 DIAGNOSIS — I10 Essential (primary) hypertension: Secondary | ICD-10-CM | POA: Diagnosis not present

## 2024-01-25 DIAGNOSIS — R634 Abnormal weight loss: Secondary | ICD-10-CM | POA: Diagnosis not present

## 2024-01-26 DIAGNOSIS — I10 Essential (primary) hypertension: Secondary | ICD-10-CM | POA: Diagnosis not present

## 2024-01-26 DIAGNOSIS — R296 Repeated falls: Secondary | ICD-10-CM | POA: Diagnosis not present

## 2024-01-26 DIAGNOSIS — I679 Cerebrovascular disease, unspecified: Secondary | ICD-10-CM | POA: Diagnosis not present

## 2024-01-26 DIAGNOSIS — R634 Abnormal weight loss: Secondary | ICD-10-CM | POA: Diagnosis not present

## 2024-01-26 DIAGNOSIS — K2100 Gastro-esophageal reflux disease with esophagitis, without bleeding: Secondary | ICD-10-CM | POA: Diagnosis not present

## 2024-01-26 DIAGNOSIS — E7849 Other hyperlipidemia: Secondary | ICD-10-CM | POA: Diagnosis not present

## 2024-01-27 DIAGNOSIS — I10 Essential (primary) hypertension: Secondary | ICD-10-CM | POA: Diagnosis not present

## 2024-01-27 DIAGNOSIS — I679 Cerebrovascular disease, unspecified: Secondary | ICD-10-CM | POA: Diagnosis not present

## 2024-01-27 DIAGNOSIS — E7849 Other hyperlipidemia: Secondary | ICD-10-CM | POA: Diagnosis not present

## 2024-01-27 DIAGNOSIS — R296 Repeated falls: Secondary | ICD-10-CM | POA: Diagnosis not present

## 2024-01-27 DIAGNOSIS — R634 Abnormal weight loss: Secondary | ICD-10-CM | POA: Diagnosis not present

## 2024-01-27 DIAGNOSIS — K2100 Gastro-esophageal reflux disease with esophagitis, without bleeding: Secondary | ICD-10-CM | POA: Diagnosis not present

## 2024-01-28 DIAGNOSIS — R296 Repeated falls: Secondary | ICD-10-CM | POA: Diagnosis not present

## 2024-01-28 DIAGNOSIS — I679 Cerebrovascular disease, unspecified: Secondary | ICD-10-CM | POA: Diagnosis not present

## 2024-01-28 DIAGNOSIS — R634 Abnormal weight loss: Secondary | ICD-10-CM | POA: Diagnosis not present

## 2024-01-28 DIAGNOSIS — I10 Essential (primary) hypertension: Secondary | ICD-10-CM | POA: Diagnosis not present

## 2024-01-28 DIAGNOSIS — K2100 Gastro-esophageal reflux disease with esophagitis, without bleeding: Secondary | ICD-10-CM | POA: Diagnosis not present

## 2024-01-28 DIAGNOSIS — E7849 Other hyperlipidemia: Secondary | ICD-10-CM | POA: Diagnosis not present

## 2024-01-29 DIAGNOSIS — I679 Cerebrovascular disease, unspecified: Secondary | ICD-10-CM | POA: Diagnosis not present

## 2024-01-29 DIAGNOSIS — I10 Essential (primary) hypertension: Secondary | ICD-10-CM | POA: Diagnosis not present

## 2024-01-29 DIAGNOSIS — K2100 Gastro-esophageal reflux disease with esophagitis, without bleeding: Secondary | ICD-10-CM | POA: Diagnosis not present

## 2024-01-29 DIAGNOSIS — E7849 Other hyperlipidemia: Secondary | ICD-10-CM | POA: Diagnosis not present

## 2024-01-29 DIAGNOSIS — R296 Repeated falls: Secondary | ICD-10-CM | POA: Diagnosis not present

## 2024-01-29 DIAGNOSIS — R634 Abnormal weight loss: Secondary | ICD-10-CM | POA: Diagnosis not present

## 2024-01-30 DIAGNOSIS — K2100 Gastro-esophageal reflux disease with esophagitis, without bleeding: Secondary | ICD-10-CM | POA: Diagnosis not present

## 2024-01-30 DIAGNOSIS — R634 Abnormal weight loss: Secondary | ICD-10-CM | POA: Diagnosis not present

## 2024-01-30 DIAGNOSIS — I10 Essential (primary) hypertension: Secondary | ICD-10-CM | POA: Diagnosis not present

## 2024-01-30 DIAGNOSIS — E7849 Other hyperlipidemia: Secondary | ICD-10-CM | POA: Diagnosis not present

## 2024-01-30 DIAGNOSIS — R296 Repeated falls: Secondary | ICD-10-CM | POA: Diagnosis not present

## 2024-01-30 DIAGNOSIS — I679 Cerebrovascular disease, unspecified: Secondary | ICD-10-CM | POA: Diagnosis not present

## 2024-01-31 ENCOUNTER — Telehealth: Payer: Self-pay | Admitting: Family

## 2024-01-31 DIAGNOSIS — I679 Cerebrovascular disease, unspecified: Secondary | ICD-10-CM | POA: Diagnosis not present

## 2024-01-31 DIAGNOSIS — I10 Essential (primary) hypertension: Secondary | ICD-10-CM | POA: Diagnosis not present

## 2024-01-31 DIAGNOSIS — K2100 Gastro-esophageal reflux disease with esophagitis, without bleeding: Secondary | ICD-10-CM | POA: Diagnosis not present

## 2024-01-31 DIAGNOSIS — E7849 Other hyperlipidemia: Secondary | ICD-10-CM | POA: Diagnosis not present

## 2024-01-31 DIAGNOSIS — R634 Abnormal weight loss: Secondary | ICD-10-CM | POA: Diagnosis not present

## 2024-01-31 DIAGNOSIS — R296 Repeated falls: Secondary | ICD-10-CM | POA: Diagnosis not present

## 2024-01-31 NOTE — Telephone Encounter (Signed)
 Facility Nurse called to report patient without any pulse and pulseless.Patient on hospice.States family at bedside.Orders given to release body to funeral home desired by POA.

## 2024-02-15 DEATH — deceased
